# Patient Record
Sex: Male | Born: 1935 | Race: White | Hispanic: No | Marital: Married | Smoking: Former smoker
Health system: Southern US, Community
[De-identification: ages and names within clinical notes are randomized; demographics above are authoritative.]

## PROBLEM LIST (undated history)

## (undated) DIAGNOSIS — Z9889 Other specified postprocedural states: Secondary | ICD-10-CM

## (undated) DIAGNOSIS — I209 Angina pectoris, unspecified: Secondary | ICD-10-CM

## (undated) DIAGNOSIS — H409 Unspecified glaucoma: Secondary | ICD-10-CM

## (undated) DIAGNOSIS — N400 Enlarged prostate without lower urinary tract symptoms: Secondary | ICD-10-CM

## (undated) DIAGNOSIS — Z8781 Personal history of (healed) traumatic fracture: Secondary | ICD-10-CM

## (undated) DIAGNOSIS — M549 Dorsalgia, unspecified: Secondary | ICD-10-CM

## (undated) DIAGNOSIS — Z8619 Personal history of other infectious and parasitic diseases: Secondary | ICD-10-CM

## (undated) DIAGNOSIS — R410 Disorientation, unspecified: Secondary | ICD-10-CM

## (undated) DIAGNOSIS — I499 Cardiac arrhythmia, unspecified: Secondary | ICD-10-CM

## (undated) DIAGNOSIS — Z950 Presence of cardiac pacemaker: Secondary | ICD-10-CM

## (undated) DIAGNOSIS — Z860100 Personal history of colon polyps, unspecified: Secondary | ICD-10-CM

## (undated) DIAGNOSIS — J302 Other seasonal allergic rhinitis: Secondary | ICD-10-CM

## (undated) DIAGNOSIS — R0602 Shortness of breath: Secondary | ICD-10-CM

## (undated) DIAGNOSIS — E785 Hyperlipidemia, unspecified: Secondary | ICD-10-CM

## (undated) DIAGNOSIS — K5790 Diverticulosis of intestine, part unspecified, without perforation or abscess without bleeding: Secondary | ICD-10-CM

## (undated) DIAGNOSIS — G8929 Other chronic pain: Secondary | ICD-10-CM

## (undated) DIAGNOSIS — R351 Nocturia: Secondary | ICD-10-CM

## (undated) DIAGNOSIS — R112 Nausea with vomiting, unspecified: Secondary | ICD-10-CM

## (undated) DIAGNOSIS — I10 Essential (primary) hypertension: Secondary | ICD-10-CM

## (undated) DIAGNOSIS — R233 Spontaneous ecchymoses: Secondary | ICD-10-CM

## (undated) DIAGNOSIS — Z8719 Personal history of other diseases of the digestive system: Secondary | ICD-10-CM

## (undated) DIAGNOSIS — M1990 Unspecified osteoarthritis, unspecified site: Secondary | ICD-10-CM

## (undated) DIAGNOSIS — I509 Heart failure, unspecified: Secondary | ICD-10-CM

## (undated) DIAGNOSIS — M2550 Pain in unspecified joint: Secondary | ICD-10-CM

## (undated) HISTORY — PX: JOINT REPLACEMENT: SHX530

## (undated) HISTORY — DX: Hyperlipidemia, unspecified: E78.5

## (undated) HISTORY — PX: INSERT / REPLACE / REMOVE PACEMAKER: SUR710

## (undated) HISTORY — DX: Personal history of (healed) traumatic fracture: Z87.81

## (undated) HISTORY — PX: BACK SURGERY: SHX140

## (undated) HISTORY — DX: Unspecified osteoarthritis, unspecified site: M19.90

## (undated) HISTORY — PX: OTHER SURGICAL HISTORY: SHX169

## (undated) HISTORY — PX: ESOPHAGOGASTRODUODENOSCOPY: SHX1529

## (undated) HISTORY — PX: COLONOSCOPY: SHX174

---

## 1999-02-24 ENCOUNTER — Ambulatory Visit (HOSPITAL_COMMUNITY): Admission: RE | Admit: 1999-02-24 | Discharge: 1999-02-24 | Payer: Self-pay | Admitting: Gastroenterology

## 1999-06-15 ENCOUNTER — Ambulatory Visit (HOSPITAL_COMMUNITY): Admission: RE | Admit: 1999-06-15 | Discharge: 1999-06-15 | Payer: Self-pay | Admitting: *Deleted

## 1999-09-28 ENCOUNTER — Other Ambulatory Visit: Admission: RE | Admit: 1999-09-28 | Discharge: 1999-09-28 | Payer: Self-pay | Admitting: Otolaryngology

## 1999-10-26 ENCOUNTER — Encounter (INDEPENDENT_AMBULATORY_CARE_PROVIDER_SITE_OTHER): Payer: Self-pay | Admitting: Specialist

## 1999-10-26 ENCOUNTER — Other Ambulatory Visit: Admission: RE | Admit: 1999-10-26 | Discharge: 1999-10-26 | Payer: Self-pay | Admitting: Otolaryngology

## 2001-02-05 ENCOUNTER — Encounter: Admission: RE | Admit: 2001-02-05 | Discharge: 2001-02-05 | Payer: Self-pay | Admitting: Internal Medicine

## 2001-02-05 ENCOUNTER — Encounter: Payer: Self-pay | Admitting: Internal Medicine

## 2001-03-22 ENCOUNTER — Ambulatory Visit (HOSPITAL_BASED_OUTPATIENT_CLINIC_OR_DEPARTMENT_OTHER): Admission: RE | Admit: 2001-03-22 | Discharge: 2001-03-22 | Payer: Self-pay | Admitting: *Deleted

## 2001-06-05 ENCOUNTER — Encounter: Admission: RE | Admit: 2001-06-05 | Discharge: 2001-06-05 | Payer: Self-pay | Admitting: Internal Medicine

## 2001-06-05 ENCOUNTER — Encounter: Payer: Self-pay | Admitting: Internal Medicine

## 2001-08-20 ENCOUNTER — Ambulatory Visit (HOSPITAL_BASED_OUTPATIENT_CLINIC_OR_DEPARTMENT_OTHER): Admission: RE | Admit: 2001-08-20 | Discharge: 2001-08-20 | Payer: Self-pay | Admitting: Internal Medicine

## 2003-01-22 ENCOUNTER — Ambulatory Visit (HOSPITAL_COMMUNITY): Admission: RE | Admit: 2003-01-22 | Discharge: 2003-01-22 | Payer: Self-pay | Admitting: *Deleted

## 2003-02-10 ENCOUNTER — Ambulatory Visit (HOSPITAL_COMMUNITY): Admission: RE | Admit: 2003-02-10 | Discharge: 2003-02-11 | Payer: Self-pay | Admitting: Cardiology

## 2004-03-18 ENCOUNTER — Ambulatory Visit (HOSPITAL_COMMUNITY): Admission: RE | Admit: 2004-03-18 | Discharge: 2004-03-19 | Payer: Self-pay | Admitting: Orthopedic Surgery

## 2004-07-04 ENCOUNTER — Encounter: Admission: RE | Admit: 2004-07-04 | Discharge: 2004-07-04 | Payer: Self-pay | Admitting: Internal Medicine

## 2006-08-27 ENCOUNTER — Encounter: Admission: RE | Admit: 2006-08-27 | Discharge: 2006-08-27 | Payer: Self-pay | Admitting: Sports Medicine

## 2006-08-30 ENCOUNTER — Encounter: Admission: RE | Admit: 2006-08-30 | Discharge: 2006-08-30 | Payer: Self-pay | Admitting: Sports Medicine

## 2006-09-13 ENCOUNTER — Encounter: Admission: RE | Admit: 2006-09-13 | Discharge: 2006-09-13 | Payer: Self-pay | Admitting: Sports Medicine

## 2006-09-27 ENCOUNTER — Encounter: Admission: RE | Admit: 2006-09-27 | Discharge: 2006-09-27 | Payer: Self-pay | Admitting: Sports Medicine

## 2007-01-19 HISTORY — PX: TRANSTHORACIC ECHOCARDIOGRAM: SHX275

## 2007-01-31 ENCOUNTER — Encounter (INDEPENDENT_AMBULATORY_CARE_PROVIDER_SITE_OTHER): Payer: Self-pay | Admitting: Cardiology

## 2007-01-31 ENCOUNTER — Inpatient Hospital Stay (HOSPITAL_COMMUNITY): Admission: AD | Admit: 2007-01-31 | Discharge: 2007-02-01 | Payer: Self-pay | Admitting: Cardiology

## 2007-03-26 ENCOUNTER — Encounter: Admission: RE | Admit: 2007-03-26 | Discharge: 2007-03-26 | Payer: Self-pay | Admitting: Sports Medicine

## 2007-04-08 ENCOUNTER — Encounter: Admission: RE | Admit: 2007-04-08 | Discharge: 2007-04-08 | Payer: Self-pay | Admitting: Sports Medicine

## 2007-09-13 ENCOUNTER — Encounter: Admission: RE | Admit: 2007-09-13 | Discharge: 2007-09-13 | Payer: Self-pay | Admitting: Sports Medicine

## 2007-12-16 ENCOUNTER — Encounter: Admission: RE | Admit: 2007-12-16 | Discharge: 2007-12-16 | Payer: Self-pay | Admitting: Sports Medicine

## 2008-02-07 ENCOUNTER — Encounter: Admission: RE | Admit: 2008-02-07 | Discharge: 2008-02-07 | Payer: Self-pay | Admitting: Sports Medicine

## 2008-03-26 ENCOUNTER — Encounter: Admission: RE | Admit: 2008-03-26 | Discharge: 2008-03-26 | Payer: Self-pay | Admitting: Family Medicine

## 2008-09-01 ENCOUNTER — Encounter: Admission: RE | Admit: 2008-09-01 | Discharge: 2008-09-01 | Payer: Self-pay | Admitting: Sports Medicine

## 2009-01-05 ENCOUNTER — Ambulatory Visit: Payer: Self-pay | Admitting: Internal Medicine

## 2009-01-05 DIAGNOSIS — I10 Essential (primary) hypertension: Secondary | ICD-10-CM | POA: Insufficient documentation

## 2009-01-18 ENCOUNTER — Ambulatory Visit: Payer: Self-pay | Admitting: Internal Medicine

## 2009-01-18 LAB — CONVERTED CEMR LAB
Basophils Relative: 0.3 % (ref 0.0–3.0)
Calcium: 8.9 mg/dL (ref 8.4–10.5)
Creatinine, Ser: 1.8 mg/dL — ABNORMAL HIGH (ref 0.4–1.5)
Eosinophils Absolute: 0.3 10*3/uL (ref 0.0–0.7)
Eosinophils Relative: 3.5 % (ref 0.0–5.0)
GFR calc non Af Amer: 39.21 mL/min (ref >60)
Hemoglobin: 12.9 g/dL — ABNORMAL LOW (ref 13.0–17.0)
INR: 1.3 — ABNORMAL HIGH (ref 0.8–1.0)
Lymphocytes Relative: 23.3 % (ref 12.0–46.0)
MCHC: 34.4 g/dL (ref 30.0–36.0)
Monocytes Relative: 7.4 % (ref 3.0–12.0)
Neutro Abs: 4.9 10*3/uL (ref 1.4–7.7)
Neutrophils Relative %: 65.5 % (ref 43.0–77.0)
Prothrombin Time: 13.4 s — ABNORMAL HIGH (ref 9.1–11.7)
RBC: 3.69 M/uL — ABNORMAL LOW (ref 4.22–5.81)
Sodium: 132 meq/L — ABNORMAL LOW (ref 135–145)
WBC: 7.4 10*3/uL (ref 4.5–10.5)

## 2009-01-22 ENCOUNTER — Ambulatory Visit: Payer: Self-pay | Admitting: Internal Medicine

## 2009-01-22 ENCOUNTER — Inpatient Hospital Stay (HOSPITAL_COMMUNITY): Admission: AD | Admit: 2009-01-22 | Discharge: 2009-01-23 | Payer: Self-pay | Admitting: Internal Medicine

## 2009-01-23 ENCOUNTER — Encounter: Payer: Self-pay | Admitting: Internal Medicine

## 2009-01-26 ENCOUNTER — Encounter: Payer: Self-pay | Admitting: Internal Medicine

## 2009-02-04 ENCOUNTER — Encounter: Payer: Self-pay | Admitting: Internal Medicine

## 2009-02-04 ENCOUNTER — Ambulatory Visit: Payer: Self-pay

## 2009-02-05 LAB — CONVERTED CEMR LAB
Chloride: 90 meq/L — ABNORMAL LOW (ref 96–112)
Creatinine, Ser: 1.3 mg/dL (ref 0.4–1.5)
Sodium: 133 meq/L — ABNORMAL LOW (ref 135–145)

## 2009-02-16 ENCOUNTER — Telehealth: Payer: Self-pay | Admitting: Internal Medicine

## 2009-02-18 ENCOUNTER — Ambulatory Visit: Payer: Self-pay | Admitting: Internal Medicine

## 2009-03-15 ENCOUNTER — Encounter: Payer: Self-pay | Admitting: Internal Medicine

## 2009-03-16 ENCOUNTER — Encounter: Payer: Self-pay | Admitting: Internal Medicine

## 2009-03-19 ENCOUNTER — Encounter: Payer: Self-pay | Admitting: Internal Medicine

## 2009-03-22 ENCOUNTER — Telehealth: Payer: Self-pay | Admitting: Internal Medicine

## 2009-05-04 ENCOUNTER — Ambulatory Visit: Payer: Self-pay | Admitting: Internal Medicine

## 2009-05-04 DIAGNOSIS — Z950 Presence of cardiac pacemaker: Secondary | ICD-10-CM

## 2009-05-21 ENCOUNTER — Encounter: Admission: RE | Admit: 2009-05-21 | Discharge: 2009-05-21 | Payer: Self-pay | Admitting: Sports Medicine

## 2010-01-12 ENCOUNTER — Encounter: Admission: RE | Admit: 2010-01-12 | Discharge: 2010-01-12 | Payer: Self-pay | Admitting: Sports Medicine

## 2010-02-22 ENCOUNTER — Ambulatory Visit: Payer: Self-pay | Admitting: Internal Medicine

## 2010-03-09 ENCOUNTER — Encounter
Admission: RE | Admit: 2010-03-09 | Discharge: 2010-03-09 | Payer: Self-pay | Source: Home / Self Care | Attending: Sports Medicine | Admitting: Sports Medicine

## 2010-04-18 ENCOUNTER — Encounter: Payer: Self-pay | Admitting: Internal Medicine

## 2010-04-19 NOTE — Letter (Signed)
Summary: [facility]  [facility]   Imported By: Marylou Mccoy 04/08/2009 12:12:04  _____________________________________________________________________  External Attachment:    Type:   Image     Comment:   External Document

## 2010-04-19 NOTE — Letter (Signed)
Summary: [facility]  [facility]   Imported By: Marylou Mccoy 04/08/2009 13:16:15  _____________________________________________________________________  External Attachment:    Type:   Image     Comment:   External Document

## 2010-04-19 NOTE — Cardiovascular Report (Signed)
Summary: Status Report  Status Report   Imported By: Kassie Mends 03/24/2009 09:08:01  _____________________________________________________________________  External Attachment:    Type:   Image     Comment:   External Document

## 2010-04-19 NOTE — Progress Notes (Signed)
Summary: SOB  Phone Note Call from Patient Call back at [phone]   Caller: Spouse/Shirley Reason for Call: Talk to Nurse Summary of Call: SOB, having trouble walking, think pacer is not working, starting going on friday, wants to come in today Initial call taken by: Migdalia Dk,  March 22, 2009 11:13 AM  Follow-up for Phone Call        spoke with pt's wife his device was checked on 02/18/09 and was functioning normally.  They can come in tomorrow and see Dr Ladona Ridgel but might should start at his primary.  Sont think that disorientation has anything to do with his device. Dennis Bast, RN, BSN  March 22, 2009 1:10 PM  Additional Follow-up for Phone Call Additional follow up Details #1::        wife called back and they are going to go to primary today.  will call us back if need to see Dr Russ Halo, RN, BSN  March 22, 2009 1:10 PM

## 2010-04-19 NOTE — Cardiovascular Report (Signed)
Summary: Office Visit   Office Visit   Imported By: Roderic Ovens 05/11/2009 11:45:50  _____________________________________________________________________  External Attachment:    Type:   Image     Comment:   External Document

## 2010-04-19 NOTE — Assessment & Plan Note (Signed)
Summary: pc2/jml   Primary Provider:  Rayetta Pigg  CC:  dizzy spells -- break out.  History of Present Illness: Mr. Detter returns today for folllowup of his BiV upgrade.  The patient is a pleasant 77 yo man with class 3 CHF, and CHB who underwent device revision several months ago.  At that time he had an ICD lead with an elevated pacing threshold and an LV lead with the same problem.  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  He has felt much improved and his CHF is now class 2.  He has had no fever or chills. He initially had a stitch abscess over his right neck incision which has healed.  His main complaint today is regarding chronic dizziness which he has experienced over the past several months.  Since he has become more active with his new device, he is bothered by it more than when he was more sedentary.  Current Medications (verified): 1)  Losartan Potassium 25 Mg Tabs (Losartan Potassium) .... Once Daily 2)  Metoprolol Tartrate 50 Mg Tabs (Metoprolol Tartrate) .... 3 Tabs At Bedtime 3)  Isosorbide Mononitrate 20 Mg Tabs (Isosorbide Mononitrate) .... Once Daily 4)  Diclofenac Sodium 75 Mg Tbec (Diclofenac Sodium) .... Once Daily 5)  Furosemide 40 Mg Tabs (Furosemide) .... Take 1-2 Tablets By Mouth Daily 6)  Lipitor 20 Mg Tabs (Atorvastatin Calcium) .... Once Daily 7)  Nitrostat 0.4 Mg Subl (Nitroglycerin) .... As Needed 8)  Lantus 100 Unit/ml Soln (Insulin Glargine) .... As Directed 9)  Humulin N 100 Unit/ml Susp (Insulin Isophane Human) .... As Directed 10)  Potassium Gluconate 595 Mg Tabs (Potassium Gluconate) .... Once Daily 11)  Claritin .... Once Daily 12)  Lovaza Omega 3 .... Four Times A Day 13)  Saw Palmetto 450 Mg Caps (Saw Palmetto (Serenoa Repens)) .... Four Times A Day 14)  Cormbigan Eye  Drops 0.2 .... Two Drops  Two Times A Day 15)  Trevatan Z .... Once Nightly  Allergies (verified): No Known Drug Allergies  Past History:  Past Medical History: Last updated: 01/01/2009 Nose broken 3 times Hypertension treated Type II diabetes Cardiomyopathy Hyperlipidemia Arthritis  Review of Systems  The patient denies chest pain, syncope, dyspnea on exertion, and peripheral edema.    Vital Signs:  Patient profile:   77 year old male Height:      71 inches Weight:      228 pounds BMI:     31.91 Pulse rate:   77 / minute BP sitting:   132 / 81  (left arm) Cuff size:   regular  Vitals Entered By: Hardin Negus, RMA (May 04, 2009 3:20 PM)  Physical Exam  General:  Well developed, well nourished, in no acute distress. Head:  normocephalic and atraumatic Eyes:  PERRLA/EOM intact; conjunctiva and lids normal. Mouth:  Teeth, gums and palate normal. Oral mucosa normal. Neck:  well healed PM lead incision in the right neck area. Chest Wall:  Well healed PM incision. Lungs:  Clear bilaterally without basilar rales. No wheezes or rhonchi. Heart:  RRR.  PMI is enlarged and laterally displaced. Abdomen:  Obese, NT,ND. No organomegally. Msk:  Back normal, normal gait. Muscle strength and tone normal. Pulses:  pulses normal in all 4 extremities Extremities:  No clubbing or cyanosis.  Trace peripheral edema. Neurologic:  Alert and oriented x 3.   PPM Specifications Following MD:  Lewayne Bunting, MD     PPM Vendor:  St Jude     PPM Model Number:  [id number]     PPM Serial Number:  [id number] PPM DOI:  01/22/2009     PPM Implanting MD:  Lewayne Bunting, MD  Lead 1    Location: RA     DOI: 02/10/2003     Model #: 8295     Serial #: [id number]     Status: active Lead 2    Location: RV     DOI: 02/10/2003     Model #: 8469     Serial #: [id number]     Status: active Lead 3    Location: LV     DOI: 02/10/2003     Model #: 4193     Serial #: [id number]     Status: capped Lead 4     Location: LV     DOI: 01/22/2009     Model #: [id number]     Serial #: [id number]     Status: active  Magnet Response Rate:  BOL 100 ERI  85    PPM Follow Up Remote Check?  No Battery Voltage:  2.98 V     Battery Est. Longevity:  7.8 years     Pacer Dependent:  No       PPM Device Measurements Atrium  Amplitude: 2.6 mV, Impedance: 440 ohms, Threshold: 0.75 V at 0.4 msec Right Ventricle  Amplitude: 11.9 mV, Impedance: 440 ohms, Threshold: 0.875 V at 0.4 msec Left Ventricle  Impedance: 600 ohms, Threshold: 1.25 V at 0.8 msec Configuration: BIPOLAR  Episodes MS Episodes:  42     Percent Mode Switch:  <1%     Coumadin:  No Atrial Pacing:  12%     Ventricular Pacing:  100%  Parameters Mode:  DDD     Lower Rate Limit:  60     Upper Rate Limit:  120 Paced AV Delay:  160     Sensed AV Delay:  130 Next Cardiology Appt Due:  01/18/2010 Tech Comments:  RV autocapturen programmed on.  Device function normal.  ROV 11/11 Dr. Ladona Ridgel.l Altha Harm, LPN  May 04, 2009 3:40 PM  MD Comments:  His device is working normally.  ICD Specifications Following MD:  Lewayne Bunting, MD     Referring MD:  [facility] ICD Vendor:  Medtronic     ICD Model Number:  [phone]     ICD Serial Number:  [id number] ICD DOI:  01/31/2007     ICD Implanting MD:  EDMONDS  Lead 1:    Location: RA     DOI: 02/10/2003     Model #: 9518     Serial #: [id number]     Status: active Lead 2:    Location: RV     DOI: 01/31/2007     Model #: 1601     Serial #: [id number]     Status: active Lead 3:    Location: LV     DOI: 02/10/2003     Model #: 2202     Serial #: [id number]     Status: active  Indications::  CM, CHF   ICD Follow Up ICD Dependent:  No       ICD Device Measurements Configuration: LV TIP TO RV COIL  Brady Parameters Mode DDDR     Lower Rate Limit:  60  Upper Rate Limit 130 PAV 130     Sensed AV Delay:  160  Tachy Zones VF:  200     VT:  250 FVT VIA VF     VT1:  167     Impression &  Recommendations:  Problem # 1:  CARDIAC PACEMAKER IN SITU (ICD-V45.01) The patient's BiV PM is working normally.  Will recheck in several months.  Problem # 2:  CHRONIC SYSTOLIC HEART FAILURE (ICD-428.22) His symptoms are now class 2 after BiV lead revision.  A low sodium diet and continuation of his current meds is recommended. His updated medication list for this problem includes:    Losartan Potassium 25 Mg Tabs (Losartan potassium) ..... Once daily    Metoprolol Tartrate 50 Mg Tabs (Metoprolol tartrate) .Marland KitchenMarland KitchenMarland KitchenMarland Kitchen 3 tabs at bedtime    Isosorbide Mononitrate 20 Mg Tabs (Isosorbide mononitrate) ..... Once daily    Furosemide 40 Mg Tabs (Furosemide) .Marland Kitchen... Take 1-2 tablets by mouth daily    Nitrostat 0.4 Mg Subl (Nitroglycerin) .Marland Kitchen... As needed  Problem # 3:  ESSENTIAL HYPERTENSION, BENIGN (ICD-401.1) A low sodium diet is recommended and he will continue his current medical regimen. His updated medication list for this problem includes:    Losartan Potassium 25 Mg Tabs (Losartan potassium) ..... Once daily    Metoprolol Tartrate 50 Mg Tabs (Metoprolol tartrate) .Marland KitchenMarland KitchenMarland KitchenMarland Kitchen 3 tabs at bedtime    Furosemide 40 Mg Tabs (Furosemide) .Marland Kitchen... Take 1-2 tablets by mouth daily  Patient Instructions: 1)  Your physician recommends that you schedule a follow-up appointment in: Nov 2011with Dr Ladona Ridgel

## 2010-04-19 NOTE — Assessment & Plan Note (Signed)
Summary: device/saf   Visit Type:  Follow-up Primary Provider:  Rayetta Pigg   History of Present Illness: Mr. Haub returns today for folllowup of his BiV upgrade.  The patient is a pleasant 77 yo man with class 3 CHF, and CHB who underwent device revision several months ago.   He has felt much improved and his CHF is now class 2.   His main complaint today is related to his sore back.  When he is out walking, he notes that his lower back cramps up.  Since he has become more active with his new device, he is bothered by it more than when he was more sedentary.  Current Medications (verified): 1)  Losartan Potassium 25 Mg Tabs (Losartan Potassium) .... Once Daily 2)  Metoprolol Tartrate 50 Mg Tabs (Metoprolol Tartrate) .... 3 Tabs At Bedtime 3)  Diclofenac Sodium 75 Mg Tbec (Diclofenac Sodium) .... Once Daily 4)  Furosemide 40 Mg Tabs (Furosemide) .... Take 1-2 Tablets By Mouth Daily 5)  Simvastatin 40 Mg Tabs (Simvastatin) .... Take One Tablet By Mouth Daily At Bedtime 6)  Nitrostat 0.4 Mg Subl (Nitroglycerin) .... As Needed 7)  Lantus 100 Unit/ml Soln (Insulin Glargine) .... As Directed 8)  Humulin N 100 Unit/ml Susp (Insulin Isophane Human) .... As Directed 9)  Potassium Gluconate 595 Mg Tabs (Potassium Gluconate) .... Once Daily 10)  Claritin .... Once Daily 11)  Lovaza Omega 3 .... Four Times A Day 12)  Saw Palmetto 450 Mg Caps (Saw Palmetto (Serenoa Repens)) .... Four Times A Day 13)  Cormbigan Eye Drops 0.2 .... Two Drops  Two Times A Day 14)  Trevatan Z .... Once Nightly 15)  Multivitamins   Tabs (Multiple Vitamin) .... Once Daily 16)  Pepcid Ac 10 Mg Tabs (Famotidine) .... Once Daily  Allergies (verified): No Known Drug Allergies  Past History:  Past Medical History: Last updated: 01/01/2009 Nose broken 3 times Hypertension treated Type II diabetes Cardiomyopathy Hyperlipidemia Arthritis  Review of Systems  The patient denies chest pain, syncope, dyspnea on  exertion, and peripheral edema.    Vital Signs:  Patient profile:   77 year old male Height:      71 inches Weight:      220 pounds BMI:     30.79 Pulse rate:   62 / minute BP sitting:   112 / 72  (left arm)  Vitals Entered By: Laurance Flatten CMA (February 22, 2010 10:00 AM)  Physical Exam  General:  Well developed, well nourished, in no acute distress. Head:  normocephalic and atraumatic Eyes:  PERRLA/EOM intact; conjunctiva and lids normal. Mouth:  Teeth, gums and palate normal. Oral mucosa normal. Neck:  well healed PM lead incision in the right neck area. Chest Wall:  Well healed PM incision. Lungs:  Clear bilaterally without basilar rales. No wheezes or rhonchi. Heart:  RRR.  PMI is enlarged and laterally displaced. Abdomen:  Obese, NT,ND. No organomegally. Msk:  Back normal, normal gait. Muscle strength and tone normal. Pulses:  pulses normal in all 4 extremities Extremities:  No clubbing or cyanosis. Trace peripheral edema. Neurologic:  Alert and oriented x 3.   PPM Specifications Following MD:  Lewayne Bunting, MD     PPM Vendor:  St Jude     PPM Model Number:  [id number]     PPM Serial Number:  [id number] PPM DOI:  01/22/2009     PPM Implanting MD:  Lewayne Bunting, MD  Lead 1    Location: RA  DOI: 02/10/2003     Model #: 4132     Serial #: [id number]     Status: active Lead 2    Location: RV     DOI: 02/10/2003     Model #: 3664     Serial #: [id number]     Status: active Lead 3    Location: LV     DOI: 02/10/2003     Model #: 4193     Serial #: [id number]     Status: capped Lead 4    Location: LV     DOI: 01/22/2009     Model #: 3295     Serial #: [id number]     Status: active  Magnet Response Rate:  BOL 100 ERI  85    PPM Follow Up Battery Voltage:  2.96 V     Battery Est. Longevity:  6.4 yrs     Pacer Dependent:  No       PPM Device Measurements Atrium  Amplitude: 4.4 mV, Impedance: 440 ohms, Threshold: 1.25 V at 0.4 msec Right Ventricle  Amplitude: 12.0 mV,  Impedance: 400 ohms, Threshold: 1.125 V at 0.4 msec Left Ventricle  Impedance: 530 ohms, Threshold: 1.0 V at 0.8 msec Configuration: BIPOLAR  Episodes MS Episodes:  209     Percent Mode Switch:  <1%     Coumadin:  No Ventricular High Rate:  0     Atrial Pacing:  21&     Ventricular Pacing:  99%  Parameters Mode:  DDD     Lower Rate Limit:  60     Upper Rate Limit:  120 Paced AV Delay:  160     Sensed AV Delay:  130 Next Remote Date:  05/26/2010     Next Cardiology Appt Due:  02/20/2011 Tech Comments:  209 AMS EPISODES--LONGEST WAS 3 MIN 22 SECONDS.  NORMAL DEVICE FUNCTION.  CHANGED LV OUTPUT FROM 1.75 TO 2.0 AND TURNED ON RATE RESPONSE DURING MODE SWITCH.  PT WOULD LIKE TO BE ENROLLED IN [facility].  [facility]LIN 05-26-10 AND ROV IN 12 MTHS W/GT. Vella Kohler  February 22, 2010 10:19 AM MD Comments:  Agree with above.   ICD Specifications Following MD:  Lewayne Bunting, MD     Referring MD:  [facility] ICD Vendor:  Medtronic     ICD Model Number:  [phone]     ICD Serial Number:  [id number] ICD DOI:  01/31/2007     ICD Implanting MD:  EDMONDS  Lead 1:    Location: RA     DOI: 02/10/2003     Model #: 2025     Serial #: [id number]     Status: active Lead 2:    Location: RV     DOI: 01/31/2007     Model #: 2831     Serial #: [id number]     Status: active Lead 3:    Location: LV     DOI: 02/10/2003     Model #: 7106     Serial #: [id number]     Status: active  Indications::  CM, CHF   ICD Follow Up ICD Dependent:  No       ICD Device Measurements Configuration: LV TIP TO RV COIL  Brady Parameters Mode DDDR     Lower Rate Limit:  60     Upper Rate Limit 130 PAV 130     Sensed AV Delay:  160  Tachy Zones VF:  200     VT:  250  FVT VIA VF     VT1:  167     Impression & Recommendations:  Problem # 1:  CARDIAC PACEMAKER IN SITU (ICD-V45.01) His device is working normally.  will recheck in several months.  Problem # 2:  CHRONIC SYSTOLIC HEART FAILURE (ICD-428.22) His symptoms remain class 2.  He  will continue his meds as below. The following medications were removed from the medication list:    Isosorbide Mononitrate 20 Mg Tabs (Isosorbide mononitrate) ..... Once daily His updated medication list for this problem includes:    Losartan Potassium 25 Mg Tabs (Losartan potassium) ..... Once daily    Metoprolol Tartrate 50 Mg Tabs (Metoprolol tartrate) .Marland KitchenMarland KitchenMarland KitchenMarland Kitchen 3 tabs at bedtime    Furosemide 40 Mg Tabs (Furosemide) .Marland Kitchen... Take 1-2 tablets by mouth daily    Nitrostat 0.4 Mg Subl (Nitroglycerin) .Marland Kitchen... As needed  Problem # 3:  ESSENTIAL HYPERTENSION, BENIGN (ICD-401.1) His blood pressure remains well controlled. Will follow. His updated medication list for this problem includes:    Losartan Potassium 25 Mg Tabs (Losartan potassium) ..... Once daily    Metoprolol Tartrate 50 Mg Tabs (Metoprolol tartrate) .Marland KitchenMarland KitchenMarland KitchenMarland Kitchen 3 tabs at bedtime    Furosemide 40 Mg Tabs (Furosemide) .Marland Kitchen... Take 1-2 tablets by mouth daily  Patient Instructions: 1)  Your physician wants you to follow-up in: 12 months with Dr Court Joy will receive a reminder letter in the mail two months in advance. If you don't receive a letter, please call our office to schedule the follow-up appointment. Prescriptions: FUROSEMIDE 40 MG TABS (FUROSEMIDE) Take 1-2 tablets by mouth daily  #60 x 11   Entered by:   Laurance Flatten CMA   Authorized by:   Laren Boom, MD, [facility]   Signed by:   Laurance Flatten CMA on 02/22/2010   Method used:   Electronically to        [facility]* (retail)       [address] [place], [place]  [address]       Ph: [phone] or [phone]       Fax: [phone]   RxID:   [id number]

## 2010-04-21 NOTE — Cardiovascular Report (Signed)
Summary: Office Visit   Office Visit   Imported By: Roderic Ovens 03/03/2010 11:55:21  _____________________________________________________________________  External Attachment:    Type:   Image     Comment:   External Document

## 2010-04-27 NOTE — Miscellaneous (Signed)
Summary: corrected device information  Clinical Lists Changes  Observations: Added new observation of PPMLEADMOD4: [id number]  (04/18/2010 19:11)      PPM Specifications Following MD:  Lewayne Bunting, MD     PPM Vendor:  St Jude     PPM Model Number:  [phone]     PPM Serial Number:  [id number] PPM DOI:  01/22/2009     PPM Implanting MD:  Lewayne Bunting, MD  Lead 1    Location: RA     DOI: 02/10/2003     Model #: 1191     Serial #: [id number]     Status: active Lead 2    Location: RV     DOI: 02/10/2003     Model #: 3086     Serial #: [id number]     Status: active Lead 3    Location: LV     DOI: 02/10/2003     Model #: 4193     Serial #: [id number]     Status: capped Lead 4    Location: LV     DOI: 01/22/2009     Model #: [id number]     Serial #: [id number]     Status: active  Magnet Response Rate:  BOL 100 ERI  85    PPM Follow Up Pacer Dependent:  No     Configuration: BIPOLAR  Episodes Coumadin:  No  Parameters Mode:  DDD     Lower Rate Limit:  60     Upper Rate Limit:  120 Paced AV Delay:  160     Sensed AV Delay:  130  ICD Specifications Following MD:  Lewayne Bunting, MD     Referring MD:  EDMONDS ICD Vendor:  Medtronic     ICD Model Number:  [id number]     ICD Serial Number:  [id number] ICD DOI:  01/31/2007     ICD Implanting MD:  EDMONDS  Lead 1:    Location: RA     DOI: 02/10/2003     Model #: 5643     Serial #: [id number]     Status: active Lead 2:    Location: RV     DOI: 01/31/2007     Model #: 6606     Serial #: [id number]     Status: active Lead 3:    Location: LV     DOI: 02/10/2003     Model #: 2355     Serial #: [id number]     Status: active  Indications::  CM, CHF   ICD Follow Up ICD Dependent:  No       ICD Device Measurements Configuration: LV TIP TO RV COIL  Brady Parameters Mode DDDR     Lower Rate Limit:  60     Upper Rate Limit 130 PAV 130     Sensed AV Delay:  160  Tachy Zones VF:  200     VT:  250 FVT VIA VF     VT1:  167

## 2010-05-26 ENCOUNTER — Encounter: Payer: Self-pay | Admitting: Internal Medicine

## 2010-05-26 ENCOUNTER — Telehealth: Payer: Self-pay | Admitting: Internal Medicine

## 2010-05-26 ENCOUNTER — Encounter (INDEPENDENT_AMBULATORY_CARE_PROVIDER_SITE_OTHER): Payer: Medicare Other

## 2010-05-26 DIAGNOSIS — I495 Sick sinus syndrome: Secondary | ICD-10-CM

## 2010-06-02 ENCOUNTER — Encounter: Payer: Self-pay | Admitting: *Deleted

## 2010-06-07 NOTE — Letter (Signed)
Summary: Remote Device Check  [facility], Main Office  [address]   [place], [place] [address]   Phone: [phone]  Fax: [phone]     June 02, 2010 MRN: [id number]   Louis Acosta [address] [place], [place]  [address]   Dear Mr. [name],   Your remote transmission was recieved and reviewed by your physician.  All diagnostics were within normal limits for you.  _X____Your next transmission is scheduled for: 08/25/10.  Please transmit at any time this day.  If you have a wireless device your transmission will be sent automatically.  ______Your next office visit is scheduled for:                              . Please call our office to schedule an appointment.    Sincerely,  Altha Harm, LPN

## 2010-06-07 NOTE — Cardiovascular Report (Signed)
Summary: Office Visit   Office Visit   Imported By: Roderic Ovens 06/03/2010 14:54:55  _____________________________________________________________________  External Attachment:    Type:   Image     Comment:   External Document

## 2010-06-07 NOTE — Progress Notes (Signed)
Summary: question re device  Phone Note Call from Patient Call back at Home Phone [phone]   Caller: Patient Reason for Call: Talk to Nurse Summary of Call: pt has question re transmitting at home. Initial call taken by: Roe Coombs,  May 26, 2010 2:07 PM  Follow-up for Phone Call        no answer or answering machine  Additional Follow-up for Phone Call Additional follow up Details #1::        spoke w/pt---merlin transmission received. pt aware that he has wireless device and will automatically send as long as it is scheduled thru website. Vella Kohler  May 31, 2010 3:24 PM

## 2010-06-22 LAB — GLUCOSE, CAPILLARY
Glucose-Capillary: 125 mg/dL — ABNORMAL HIGH (ref 70–99)
Glucose-Capillary: 138 mg/dL — ABNORMAL HIGH (ref 70–99)
Glucose-Capillary: 144 mg/dL — ABNORMAL HIGH (ref 70–99)
Glucose-Capillary: 148 mg/dL — ABNORMAL HIGH (ref 70–99)
Glucose-Capillary: 188 mg/dL — ABNORMAL HIGH (ref 70–99)

## 2010-08-02 NOTE — Op Note (Signed)
NAMEALBA, KRIESEL              ACCOUNT NO.:  [id number]   MEDICAL RECORD NO.:  [id number]          PATIENT TYPE:  INP   LOCATION:  2807                         FACILITY:  [facility]   PHYSICIAN:  Francisca December, M.D.  DATE OF BIRTH:  01/03/1932   DATE OF PROCEDURE:  01/31/2007  DATE OF DISCHARGE:                               OPERATIVE REPORT   PROCEDURES PERFORMED:  1. Left subclavian venogram.  2. Upgrade biventricular pacemaker to IC/BiV.   INDICATIONS:  [name] is a 77 year old man who is now  approximately 2-1/2 years s/p implant of a biventricular pacemaker.  At  the time he declined an AICD.  His LVEF has continued to drop from 30%  now to 15%.  He has consented to an upgrade of the device to include  ventricular defibrillation capabilities.   DESCRIPTION OF PROCEDURE:  The patient was brought to the cardiac  catheterization laboratory in the fasting state.  The left prepectoral  region was prepped and draped in the usual sterile fashion.  Local  anesthesia was obtained with the infiltration of 1% lidocaine with  epinephrine throughout the left prepectoral region.  A 7-8 cm incision  was made over the previous incision site and this was carried down by  sharp dissection to the pacemaker capsule. The pacemaker capsule was  incised and the device delivered without difficulty.  The leads were  partially dissected using both blunt dissection and electrocautery.  A  left subclavian venogram was performed with a peripheral injection of 10  mL of Omnipaque.  A digital cine angiogram was obtained and road  mapped to guide future left subclavian puncture.  The subclavian  venogram did demonstrate the subclavian vein to be widely patent and  coursing in a normal fashion over the anterior surface of the first rib  and beneath the middle third of the clavicle.  There was no evidence for  persistence of the left superior vena cava.  The left subclavian vein  was then  punctured once using an 18-gauge thin-wall needle through which  was passed a 0.038-inch tight J guidewire.  Over this guidewire, a 9-  French tearaway sheath and dilator were advanced. The dilator and wire  were removed and the ventricular lead was advanced to the level of the  right atrium.  The sheath was torn away.  Using standard technique and  fluoroscopic landmarks, the pace shock lead was advanced into the right  ventricular apex.  This was an active fixation device and the screw was  advanced as appropriate.  It was tested for adequate pacing parameters  and these are reported below.  Is was also tested for diaphragmatic  pacing at 10 volts and none was found.  The lead was then sutured into  place using three separate #0 silk ligatures.  The pocket was then  enlarged both by blunt dissection and electrocautery.  It was copiously  irrigated with 1% kanamycin solution.  The leads were then attached to  the new pace shock generator carefully identifying each by its serial  number and placing each into the appropriate receptacle.  Each lead was  tightened into place and tested for security.  The previous RV lead was  capped and tied down with an #0 silk suture.  The leads were wound  beneath the pace shock generator and the generator was placed in the  pocket.  The pocket was then closed using 2-0 Vicryl in a running  fashion for the subcutaneous layer.  A second subcutaneous layer was  applied with 3-0 Vicryl.  The skin was approximated with 4-0 Vicryl in a  running subcuticular fashion.  Steri-Strips and a sterile dressing were  applied.  This patient was transported to the recovery area in stable  condition.   EQUIPMENT DATA:  The explanted device is a Medtronic model number [id number],  serial number [id number].  The implant lead is a Medtronic model number  [id number], serial number [id number]. The pace shock generator is a Medtronic  Concerto model [id number], serial number [id number].    PACING DATA:  The right ventricular lead detected a 12.5 mV R wave.  The  pacing threshold was 0.15 volts at 0.5 milliseconds pulse width.  The  impedance was 963 ohms.  The right atrial lead detected a 2.7 mV P-wave.  The pacing threshold was 0.7 volts at 0.5 milliseconds pulse width.  The  impedance was 567 ohms.  The left ventricular threshold using the RV  coil to the LV tip was 5 volts at 0.6 milliseconds pulse width.   The RV lead was remanipulated twice in an attempt to improve the RV LV  threshold.  It was confirmed prior to the procedure to be 1.5 volts.  We  using the old RV ring and the LV tip, the threshold was confirmed to be  1.5 volts; however, this could not be reproduced using the RV coil in  any of the positions I was able to place the device.  Further attempts  were therefore discontinued and the procedure was completed as described  above.      Francisca December, M.D.  Electronically Signed     JHE/MEDQ  D:  01/31/2007  T:  02/01/2007  Job:  [id number]   cc:   Corky Crafts, MD

## 2010-08-02 NOTE — Op Note (Signed)
NAMEDECLAN, Louis Acosta              ACCOUNT NO.:  [id number]   MEDICAL RECORD NO.:  [id number]          PATIENT TYPE:  INP   LOCATION:  2025                         FACILITY:  [facility]   PHYSICIAN:  Francisca December, M.D.  DATE OF BIRTH:  19-Dec-1931   DATE OF PROCEDURE:  02/01/2007  DATE OF DISCHARGE:                               OPERATIVE REPORT   PROCEDURE PERFORMED:  Defibrillation threshold testing.   INDICATIONS:  Status post implantable cardiovascular defibrillator  placement, January 31, 2007.  The patient has a severe nonischemic  cardiomyopathy, ICD placed for prophylactic purposes.   PROCEDURE NOTE:  While monitoring heart rate, blood pressure, O2  saturation and ECG and under the direct supervision of Dr. Sheldon Silvan  of the anesthesia department, the patient was administered a total dose  of 200 mg of Pentothal.   Attempted induction of ventricular fibrillation was unsuccessful despite  four attempts with shock on T, 8-drive pacing train.  Also attempted 50  Hz induction x2, again without successful induction of ventricular  fibrillation.  The patient reverted promptly to sinus rhythm with  ventricular pacing.   IMPRESSION:  Unable to induce ventricular fibrillation despite  aggressive attempts.   PLAN:  Will set the device to max output with usual VT zones from 170-  210 beats per minute.  Plan on discharging later today.      Francisca December, M.D.  Electronically Signed     JHE/MEDQ  D:  02/01/2007  T:  02/02/2007  Job:  [id number]

## 2010-08-05 NOTE — Op Note (Signed)
NAMEBASSEM, BERNASCONI              ACCOUNT NO.:  [id number]   MEDICAL RECORD NO.:  [id number]          PATIENT TYPE:  OIB   LOCATION:  2899                         FACILITY:  [facility]   PHYSICIAN:  Elana Alm. Thurston Hole, M.D. DATE OF BIRTH:  [birth year]   DATE OF PROCEDURE:  DATE OF DISCHARGE:                                 OPERATIVE REPORT   PREOPERATIVE DIAGNOSIS:  Right ankle synovitis.   POSTOPERATIVE DIAGNOSIS:  Right ankle synovitis.   PROCEDURE:  Right ankle examination under anesthesia, followed by  arthroscopic synovectomy.   ANESTHESIA:  Local and MAC.   OPERATIVE TIME:  30 minutes.   COMPLICATIONS:  None.   INDICATIONS FOR PROCEDURE:  Louis Acosta is a 77 year old gentleman who has  had significant right ankle pain for the past six to eight months,  increasing in nature, with exam and MRI documenting synovitis and mild  chondromalacia and DJD, who has failed conservative care and is now to  undergo arthroscopy.   DESCRIPTION OF PROCEDURE:  Louis Acosta was brought to the operating room on  03/18/04, placed on the operating table in the supine position.  His  anterior-lateral and anterior-medial portal sites and the joint were  sterilely injected with 0.25% Marcaine with epinephrine.  The right ankle  was examined.  Range of motion: Dorsiflexion of 5 degrees, plantar flexion  of 20 degrees.  The ankle was stable to ligamentous exam.  Right foot and  leg were prepped with sterile DuraPrep and draped using sterile technique.  Originating through an anterior-medial portal, incision was made, incising  only the skin and carefully dissecting down to the joint, preventing injury  to the dorsal cutaneous nerves.  Through an anterior-lateral portal, using  the same careful technique, an arthroscopic debrider was placed.  On initial  inspection of the articular cartilage on the table __________ was found to  be intact.  There was significant synovitis in the anterior and  anterolateral gutter, and this was thoroughly debrided.  The lateral  malleolus articular cartilage was intact.  The lateral gutter, other than  synovitis, which was debrided, had no other pathology.  After this was done,  the anterior-medial region of the ankle and the anterior-medial gutter was  thoroughly inspected and significant synovitis was noted there as well, and  this was thoroughly debrided.  Medial malleolus articular cartilage was  intact.  After this was done, there was no further pathology noted, and a  thorough synovectomy had been carried out.  Small synovial bleeders were  cauterized.  At this point, it was felt that all pathology had been  satisfactorily addressed.  The instruments were removed.  Portals were  closed with 3-0 nylon suture and injected with 0.25% Marcaine with  epinephrine.  Sterile dressings were applied.  The patient was awakened and  taken to recovery room in stable condition.  Needle and sponge counts were  correct x2 at the end of the case.   FOLLOW-UP CARE:  Louis Acosta will be followed overnight for observation on  the cardiac monitoring floor.  He will be discharged tomorrow, if stable,  with early physical  therapy.  He will be seen, in my office, in a week for  sutures out and followup.       RAW/MEDQ  D:  03/18/2004  T:  03/18/2004  Job:  [id number]

## 2010-08-05 NOTE — Op Note (Signed)
NAME:  Louis Acosta, Louis Acosta                        ACCOUNT NO.:  [id number]   MEDICAL RECORD NO.:  [id number]                   PATIENT TYPE:  OIB   LOCATION:  4710                                 FACILITY:  [facility]   PHYSICIAN:  Francisca December, M.D.               DATE OF BIRTH:  Feb 27, 1932   DATE OF PROCEDURE:  02/10/2003  DATE OF DISCHARGE:  02/11/2003                                 OPERATIVE REPORT   PROCEDURE PERFORMED:  1. Coronary angiography.  2. Coronary sinus angiography.  3. Insertion of three-lead biventricular cardiac resynchronization pacing     system.   INDICATIONS FOR PROCEDURE:  Mr. Anirudh Baiz is a 77 year old man with  known congestive heart failure and severe left ventricular dysfunction,  ejection fraction 15 to 20%.  He has NYHA class 3 exertional dyspnea from  CHF and a wide complex (LBBB) on electrocardiogram.  He is therefore a  candidate for insertion of biventricular pacemaking system.   DESCRIPTION OF PROCEDURE:  The patient was brought to the cardiac  catheterization laboratory in a fasting state.  The right groin and the left  prepectoral region were prepped and draped in the usual sterile fashion.  Local anesthesia was obtained with infiltration of 1% lidocaine over the  right femoral arterial pulse.  A 6 French catheter sheath was inserted  percutaneously into the right femoral artery utilizing an anterior posterior  guiding J-wire.  A 6 French #4 left Judkins catheter was advanced to the  ascending aorta where the left coronary os was engaged.  Cine angiography  with prolonged cine run was obtained in the AP, LAO and RAO projections for  localization and verification of patency of the coronary sinus.  The #5 left  Judkins catheter was then removed over a long guiding J-wire.  The catheter  sheath was sutured into place and connected to a transducer to monitor  arterial pressure.  After changing gown and glove, the attention was  directed to the left  prepectoral region.  Local anesthesia was attained with  the infiltration of 1% lidocaine throughout this region.  A 6 to 7 cm  incision was then made in the deltopectoral groove and this was carried down  by sharp dissection and electrocautery to the prepectoral fascia.  There a  plane was lifted and a pocket formed inferiorly and medially. The pocket was  then packed with a 1% Kanamycin soaked gauze.  Two separate left subclavian  punctures were then performed using an 18 gauge thin-walled needle through  which was passed a 0.038 inch tight J guidewire.  Over one of the  guidewires, a 5 French catheter sheath and dilator were advanced.  The  dilator was removed, the wire was allowed to remain in place.  A second  0.038 inch tight J guidewire was placed and the sheath was removed.  A 7  French tear-away sheath and dilator were then advanced over one  of the  wires.  The wire and dilator were removed.  The right ventricular lead was  advanced to the level of the right atrium.  Using standard technique and  fluoroscopic landmarks, the lead was manipulated into the right ventricular  apex.  There, excellent pacing parameters were obtained as will be noted  below.  This was and active fixation lead and the screw was advanced as  appropriate.  The tear-away sheath was removed and the lead was sutured in  place using three separate 0 silk ligatures.  Over a 5 [place] dilator, the  short 0.038 inch tight J guidewire was removed and a long 0.038 inch tight J  guidewire advanced to the level of the right atrium. A 9 French tear-away  sheath and dilator were advanced to the level of the right atrium. The  dilator was removed.  The wire was allowed to remain in place.  Using  fluoroscopic and angiographic landmarks, the sheath was manipulated into the  coronary sinus.  The wire was removed.  Cine angiography of the coronary  sinus was performed in RAO and AP projections with hand injection of   contrast.  A 0.014 inch guidewire was then advanced through the sheath and  into a left lateral major coronary vein.  The Medtronic model [id number] left  ventricular lead was advanced over this guidewire into the left lateral  vein.  Excellent pacing thresholds were obtained.  However, there was  diaphragmatic and chest wall pacing even at 3 to 5 V.  The lead was  manipulated into various positions and the chest wall pacing was not  improved.  Therefore, the lead was withdrawn to within the sheath and the  guidewire advanced out into the more distal anterolateral vein.  The lead  was manipulated into this venous structure.  There was no chest wall pacing  even at 10 V; however, the threshold was 3.5 V to capture.  The lead was  manipulated into several different positions without much improvement in the  capture threshold.  The decision was then made to allow the lead to remain  in this position.  A support Stylet was advanced into the lead and the guide  sheath removed using the slit device.  The Stylet was then removed and  fortunately, the left ventricular lead remained in position.  It was again  tested for adequate pacing and this was confirmed.  It was sutured into  place using three separate 0 silk ligatures.  Over the remaining guidewire  another 7 French tear-away sheath and dilator were advanced.  The dilator  and wire were removed.  The right atrial lead was manipulated into the right  atrium. The sheath was torn away.  Using standard technique and fluoroscopic  landmarks, the lead was manipulated into the right atrial appendage.  There  excellent pacing parameters were obtained as will be noted below.  This was  also an active fixation lead and the screw was advanced as was appropriate.  The lead was tested for diaphragmatic pacing and none was found.  The lead  was then sutured in place using three separate 0 silk ligatures.  The Kanamycin soaked gauze was removed from the pocket  and the pocket was  copiously irrigated using 1% Kanamycin solution.  The pocket was inspected  for bleeding, none was found.  Avitene was still placed within the pocket,  however, for hemostasis.  The leads were wound beneath the pacing generator  and the generator was placed  in the pocket.  This was after each lead was  carefully attached to the pacing generator in the appropriate receptacle  under the supervision of a Medtronic representative.  Each lead was  tightened into place as appropriate and tested for security.  Finally, the  wound was closed using 2-0 Dexon in a running fashion with a subcutaneous  layer.  The skin was approximated using 5-0 Dexon in a running subcuticular  fashion.  Steri-Strips and a sterile dressing were applied and the patient  was transported to the recovery area in an A-sensed biventricular paced  mode.   EQUIPMENT DATA:  The pacing generator is a Medronic InSync III, model number  [id number], serial number [id number].  The atrial lead is a Medtronic model number  [id number], serial number [id number].  The right ventricular lead is a Medtronic  model number [id number], serial number [id number].  The left ventricular lead is a  Medtronic model number [id number], serial number [id number].   PACING THRESHOLDS:  The right ventricular lead detected a 5.5 mV R wave.  The pacing threshold was 0.6 V at 0.5 msec pulse width. The impedance was  1064 ohms.  This resulted in a current capture threshold of 0.6 mA.  The  left ventricular lead detected a 20 mV R wave.  The pacing threshold was 3.7  V at 0.5 msec pulse width.  The impedance was 1019 ohms resulting in a  current capture threshold of 4.8 mA.  The right atrial lead detected a 5.1  mV P wave.  The pacing threshold was 1.4 V at 0.5 msec pulse width.  The  impedance was 560 ohms resulting in a current capture threshold of 3.0 mA.                                               Francisca December, M.D.    JHE/MEDQ  D:  02/10/2003   T:  02/11/2003  Job:  [id number]   cc:   Meade Maw, M.D.  [address]  [place]  [place] [address]  Fax: [phone]

## 2010-08-05 NOTE — Cardiovascular Report (Signed)
NAME:  Louis Acosta, Louis Acosta                        ACCOUNT NO.:  [id number]   MEDICAL RECORD NO.:  [id number]                   PATIENT TYPE:  OIB   LOCATION:  2861                                 FACILITY:  [facility]   PHYSICIAN:  Meade Maw, M.D.                 DATE OF BIRTH:  03/10/1932   DATE OF PROCEDURE:  01/22/2003  DATE OF DISCHARGE:                              CARDIAC CATHETERIZATION   INDICATION FOR PROCEDURE:  Increasing fatigue, worsening EF.  Cardiolite  study on October 20 revealing inferior apical as well anterior apical  defect.  There was minimal reversal noted.  His ejection fraction had  decreased from 30% to 15%.   PROCEDURE:  After obtaining written informed consent, the patient was  brought to the outpatient cardiac catheterization lab in a postabsorptive  state.  Preop sedation was achieved using Versed 1 mg, Benadryl 25 mg p.o.  The right groin was prepped and draped in the usual sterile fashion.  Local  anesthesia was achieved using 1% Xylocaine.  A 6 French hemostasis sheath  was placed into the right femoral artery using the modified Seldinger  technique.  Selective coronary angiography was performing using a JL-5, JR-4  Judkins catheter and multiple views were obtained.  All catheter exchanges  were made over guide wire.  It was elected not to proceed with the  ventriculogram as the patient's creatinine was noted to be 1.6.  Total  contrast used was 65 mL.  Total fluoro time 2.3 minutes.  The patient was  then transferred to the holding area.  The hemostasis sheath was removed and  hemostasis was achieved using digital pressure.   FINDINGS:  1. Aortic pressure was 120/73.  2. LV pressure 120/17.  3. His EDP was elevated at 36.   CORONARY ANGIOGRAPHY:  1. The left main coronary artery bifurcates into the left anterior     descending and circumflex vessel.  There is no disease noted in the left     main coronary artery.  2. LAD:  Left anterior  descending is a large artery giving rise to three     diagonal and ending as an apical recurrent branch.  There is luminal     irregularities of up to 20-30% in the left anterior descending.  3. Circumflex vessel:  The circumflex gives rise to an early OM-1, large OM-     2 and goes on in as a lateral branch.  There is luminal irregularities in     the circumflex and its branches only.  4. Right coronary artery:  The right coronary artery is a large dominant     artery.  It gives rise to three RV marginals and a PDA.  There is luminal     irregularities in the right coronary arteries only.   FINAL IMPRESSION:  1. Luminal irregularities.  2. Nonischemic cardiomyopathy.  3. Elevated EDP at 36.  4. The  patient will refer to EPS for consideration of biventricular pacing.                                               Meade Maw, M.D.    HP/MEDQ  D:  01/22/2003  T:  01/22/2003  Job:  [id number]

## 2010-08-05 NOTE — Op Note (Signed)
. [facility]  Patient:    Louis Acosta, Louis Acosta                     MRN: [id number] Proc. Date: 06/15/99 Adm. Date:  [id number] Attending:  Meade Maw A CC:         Darius Bump, M.D.                           Operative Report  PROCEDURE:  Cardiac catheterization.  INDICATION FOR PROCEDURE: Dilated cardiomyopathy associated with shortness of breath.  REFERRING PHYSICIAN:  Darius Bump, M.D.  DESCRIPTION OF PROCEDURE:  After obtaining written informed consent, the patient was brought to the cardiac catheterization lab in postabsorptive state.  Preop sedation was achieved using IV Versed.  The right groin was prepped and draped n the usual sterile fashion.  Local anesthesia was achieved using 1% Xylocaine.  6-French hemostasis sheath was placed into the right femoral artery using modified Seldinger technique.  Selective coronary angiography was performed using the JL-4, JR-4 Judkins catheter.  Nonionic contrast was used and was hand injected for the coronaries.  Single plane ventriculogram was performed in the RAO position using a 6-French pigtail curved catheter.  Contrast was used and was power injected for a total of 30 cc.  Of note, the patients creatinine was noted to be 1.6 on the day prior to catheterization.  Hydration was achieved, and repeat creatinine was 1.0. Prominent procedure events were reviewed with Dr. Amil Amen.  It was felt that intervention  should not be performed at this time and patient should be attempted on medical  management and having an ischemia study prior to intervention on the identified  lesions.  FINDINGS:  The aortic pressure was 121/64, LV pressure 120/25.  There was no gradient noted on pull back.  Single plane ventriculogram revealed diffuse hypokinesis with an ejection fraction of 30%.  There was no mitral regurgitation noted.  Coronary angiography:  Left main coronary artery bifurcated  into the left anterior descending and circumflex vessel.  There was no significant disease in the left  main coronary artery.  Left anterior descending:  The left anterior descending gave rise to a moderate  size D1 and small D2 and a small D3, and D4 was moderate in size.  The LAD went  onto and is an apical recurrent branch.   There was a 30 to 40% proximal lesion in the LAD, a 70% lesion in the second diagonal, and a distal 80% lesion noted.  Circumflex vessel:  The circumflex vessel gave rise to a large obtuse marginal-1, moderate size OM-2, small OM-3 and a moderate OM-4.  There was a 40% proximal long lesion in the first obtuse marginal and 30% lesion in the second obtuse marginal, and OM-3 and OM-4 were free of disease.  Right coronary artery:  The right coronary artery is dominant.  It gave rise to a small RV marginal-1, small RV marginal-2, and moderate size RV marginal-3. Right coronary artery was dominant for the posterior circulation and gave rise to a ______ .  There was a 70% lesion noted in the PDA  IMPRESSION:  Dilated cardiomyopathy.  There is critical disease involving a small second diagonal, distal left anterior descending artery and posterior descending artery.  However, the cardiomyopathy is out of proportion to the medical management.  Dr. Amil Amen was consulted for possible intervention and felt that he patient should be first started  on medical therapy.  Intervention can be considered should the patient continue to have significant chest pain.  Hypertension may be the etiology of his cardiomyopathy. DD:  06/15/99 TD:  06/15/99 Job: [id number]

## 2010-08-25 ENCOUNTER — Ambulatory Visit (INDEPENDENT_AMBULATORY_CARE_PROVIDER_SITE_OTHER): Payer: Medicare Other | Admitting: *Deleted

## 2010-08-25 DIAGNOSIS — I442 Atrioventricular block, complete: Secondary | ICD-10-CM

## 2010-08-25 DIAGNOSIS — I5022 Chronic systolic (congestive) heart failure: Secondary | ICD-10-CM

## 2010-08-28 ENCOUNTER — Other Ambulatory Visit: Payer: Self-pay | Admitting: Internal Medicine

## 2010-09-01 NOTE — Progress Notes (Signed)
Pacer remote check  

## 2010-09-09 ENCOUNTER — Encounter: Payer: Self-pay | Admitting: *Deleted

## 2010-10-11 ENCOUNTER — Other Ambulatory Visit: Payer: Self-pay | Admitting: Sports Medicine

## 2010-10-11 DIAGNOSIS — M545 Low back pain: Secondary | ICD-10-CM

## 2010-10-13 ENCOUNTER — Ambulatory Visit
Admission: RE | Admit: 2010-10-13 | Discharge: 2010-10-13 | Disposition: A | Discharge status: Home / Self Care | Payer: Medicare Other | Source: Ambulatory Visit | Attending: Sports Medicine | Admitting: Sports Medicine

## 2010-10-13 DIAGNOSIS — M545 Low back pain: Secondary | ICD-10-CM

## 2010-10-13 MED ORDER — IOHEXOL 180 MG/ML  SOLN
1.0000 mL | Freq: Once | INTRAMUSCULAR | Status: AC | PRN
  Administered 2010-10-13: 1 mL via EPIDURAL

## 2010-10-13 MED ORDER — METHYLPREDNISOLONE ACETATE 40 MG/ML INJ SUSP (RADIOLOG
120.0000 mg | Freq: Once | INTRAMUSCULAR | Status: AC
  Administered 2010-10-13: 120 mg via EPIDURAL

## 2010-11-24 ENCOUNTER — Other Ambulatory Visit: Payer: Self-pay | Admitting: Internal Medicine

## 2010-11-24 ENCOUNTER — Encounter: Payer: Self-pay | Admitting: Internal Medicine

## 2010-11-24 ENCOUNTER — Ambulatory Visit (INDEPENDENT_AMBULATORY_CARE_PROVIDER_SITE_OTHER): Payer: Medicare Other | Admitting: *Deleted

## 2010-11-24 DIAGNOSIS — Z950 Presence of cardiac pacemaker: Secondary | ICD-10-CM

## 2010-11-24 DIAGNOSIS — I442 Atrioventricular block, complete: Secondary | ICD-10-CM

## 2010-11-24 LAB — REMOTE PACEMAKER DEVICE
AL AMPLITUDE: 3 mv
AL IMPEDENCE PM: 440 Ohm
DEVICE MODEL PM: 2376403
LV LEAD IMPEDENCE PM: 550 Ohm
RV LEAD AMPLITUDE: 12 mv
RV LEAD IMPEDENCE PM: 410 Ohm

## 2010-11-30 ENCOUNTER — Encounter: Payer: Self-pay | Admitting: *Deleted

## 2010-12-05 NOTE — Progress Notes (Signed)
Pacer remote check  

## 2011-02-20 ENCOUNTER — Other Ambulatory Visit: Payer: Self-pay | Admitting: *Deleted

## 2011-02-20 MED ORDER — FUROSEMIDE 40 MG PO TABS: 40.0000 mg | ORAL_TABLET | Freq: Every day | ORAL | Status: DC

## 2011-03-24 ENCOUNTER — Encounter: Payer: Self-pay | Admitting: Internal Medicine

## 2011-03-28 ENCOUNTER — Encounter: Payer: Self-pay | Admitting: Internal Medicine

## 2011-03-28 ENCOUNTER — Ambulatory Visit (INDEPENDENT_AMBULATORY_CARE_PROVIDER_SITE_OTHER): Payer: Medicare Other | Admitting: Internal Medicine

## 2011-03-28 DIAGNOSIS — I208 Other forms of angina pectoris: Secondary | ICD-10-CM

## 2011-03-28 DIAGNOSIS — Z950 Presence of cardiac pacemaker: Secondary | ICD-10-CM

## 2011-03-28 DIAGNOSIS — I5022 Chronic systolic (congestive) heart failure: Secondary | ICD-10-CM

## 2011-03-28 DIAGNOSIS — I209 Angina pectoris, unspecified: Secondary | ICD-10-CM

## 2011-03-28 DIAGNOSIS — I447 Left bundle-branch block, unspecified: Secondary | ICD-10-CM

## 2011-03-28 LAB — PACEMAKER DEVICE OBSERVATION
AL IMPEDENCE PM: 437.5 Ohm
AL THRESHOLD: 0.75 V
BAMS-0001: 150 {beats}/min
DEVICE MODEL PM: 2376403
LV LEAD THRESHOLD: 1 V
RV LEAD AMPLITUDE: 8.4 mv

## 2011-03-28 MED ORDER — FUROSEMIDE 40 MG PO TABS: 40.0000 mg | ORAL_TABLET | Freq: Two times a day (BID) | ORAL | Status: DC

## 2011-03-28 NOTE — Progress Notes (Signed)
HPI Louis Acosta returns today for followup. He is a very pleasant 78 year old man with a long-standing dilated cardiomyopathy, complete heart block, chronic systolic heart failure, status post bi-ventricular pacemaker insertion. His heart failure symptoms remain class II. He has noted chest pain with exertion. He takes nitroglycerin as needed for control. He denies peripheral edema, syncope, fevers, chills, abdominal pain, or change in bowel or bladder habit. No Known Allergies   Current Outpatient Prescriptions  Medication Sig Dispense Refill  . aspirin 81 MG tablet Take 160 mg by mouth daily.        . B Complex Vitamins (VITAMIN-B COMPLEX PO) Take 1 tablet by mouth daily.        Marland Kitchen [employer] M[employer] UNILET LANCETS 30G MISC by Does not apply route.        . fish oil-omega-3 fatty acids 1000 MG capsule Take 2 g by mouth daily.        . furosemide (LASIX) 40 MG tablet Take 1 tablet (40 mg total) by mouth 2 (two) times daily.  60 tablet  11  . Glucose Blood (BAYER BREEZE 2 TEST VI) by In Vitro route 4 (four) times daily.        . insulin lispro (HUMALOG) 100 UNIT/ML injection Inject 10-20 Units into the skin every evening.        . loratadine (CLARITIN) 10 MG tablet Take 10 mg by mouth daily as needed.        Marland Kitchen losartan (COZAAR) 25 MG tablet Take 25 mg by mouth daily.        . metoprolol (LOPRESSOR) 50 MG tablet Take 50 mg by mouth 2 (two) times daily. Takes 2 tablets in the morning and 1 tablet in the evening.       . Misc Natural Products (OSTEO BI-FLEX TRIPLE STRENGTH PO) Take by mouth as directed.        . Multiple Vitamins-Minerals (MULTIVITAMIN WITH MINERALS) tablet Take 1 tablet by mouth daily.        . nitroGLYCERIN (NITROSTAT) 0.4 MG SL tablet Place 0.4 mg under the tongue every 5 (five) minutes as needed.        Marland Kitchen POTASSIUM CHLORIDE PO Take 595 mg by mouth daily.        . saw palmetto 500 MG capsule Take 450 mg by mouth daily.        . simvastatin (ZOCOR) 40 MG tablet Take 40 mg by mouth every  evening.        . travoprost, benzalkonium, (TRAVATAN) 0.004 % ophthalmic solution Place 1 drop into both eyes at bedtime.        Marland Kitchen DISCONTD: furosemide (LASIX) 40 MG tablet Take 1 tablet (40 mg total) by mouth daily.  60 tablet  3     Past Medical History  Diagnosis Date  . Cardiomyopathy     broken 3x's  . History of fracture of nose   . Diabetes mellitus     type 2  . Hyperlipidemia   . Arthritis     ROS:   All systems reviewed and negative except as noted in the HPI.   Past Surgical History  Procedure Date  . Transthoracic echocardiogram 01/2007     No family history on file.   History   Social History  . Marital Status: Married    Spouse Name: N/A    Number of Children: N/A  . Years of Education: N/A   Occupational History  . retired    Social History Main Topics  .  Smoking status: Never Smoker   . Smokeless tobacco: Not on file  . Alcohol Use: No  . Drug Use: Not on file  . Sexually Active: Not on file   Other Topics Concern  . Not on file   Social History Narrative  . No narrative on file     BP 108/58  Pulse 60  Ht 5\' 11"  (1.803 m)  Wt 97.07 kg (214 lb)  BMI 29.85 kg/m2  Physical Exam:  Well appearing elderly man, NAD HEENT: Unremarkable Neck:  6 cm JVD, no thyromegally Lungs:  Clear with no wheezes, rales, or rhonchi. Well-healed pacemaker incision. HEART:  Regular rate rhythm, no murmurs, no rubs, no clicks Abd:  soft, positive bowel sounds, no organomegally, no rebound, no guarding Ext:  2 plus pulses, no edema, no cyanosis, no clubbing Skin:  No rashes no nodules Neuro:  CN II through XII intact, motor grossly intact  DEVICE  Normal device function.  See PaceArt for details.   Assess/Plan:

## 2011-03-28 NOTE — Patient Instructions (Signed)
Your physician wants you to follow-up in: 12 months with Dr Taylor You will receive a reminder letter in the mail two months in advance. If you don't receive a letter, please call our office to schedule the follow-up appointment.   Remote monitoring is used to monitor your Pacemaker of ICD from home. This monitoring reduces the number of office visits required to check your device to one time per year. It allows us to keep an eye on the functioning of your device to ensure it is working properly. You are scheduled for a device check from home on 06/29/2011. You may send your transmission at any time that day. If you have a wireless device, the transmission will be sent automatically. After your physician reviews your transmission, you will receive a postcard with your next transmission date.   

## 2011-03-28 NOTE — Assessment & Plan Note (Signed)
HIs device is working normally. Will recheck in several months. 

## 2011-03-28 NOTE — Assessment & Plan Note (Signed)
I have encouraged him to continue his regular daily walking. He is instructed to keep sublingual nitroglycerin with him at all times. If his symptoms increase in frequency and severity, he is instructed to let us know and he would have to undergo catheterization

## 2011-03-28 NOTE — Assessment & Plan Note (Signed)
His symptoms remain class II. He will continue his current medical therapy and maintain a low sodium diet. 

## 2011-06-02 ENCOUNTER — Ambulatory Visit
Admission: RE | Admit: 2011-06-02 | Discharge: 2011-06-02 | Disposition: A | Discharge status: Home / Self Care | Payer: Medicare Other | Source: Ambulatory Visit | Attending: Sports Medicine | Admitting: Sports Medicine

## 2011-06-02 ENCOUNTER — Other Ambulatory Visit: Payer: Self-pay | Admitting: Sports Medicine

## 2011-06-02 DIAGNOSIS — M549 Dorsalgia, unspecified: Secondary | ICD-10-CM

## 2011-06-07 ENCOUNTER — Other Ambulatory Visit: Payer: Self-pay | Admitting: Sports Medicine

## 2011-06-07 DIAGNOSIS — M79606 Pain in leg, unspecified: Secondary | ICD-10-CM

## 2011-06-09 ENCOUNTER — Ambulatory Visit
Admission: RE | Admit: 2011-06-09 | Discharge: 2011-06-09 | Disposition: A | Discharge status: Home / Self Care | Payer: Medicare Other | Source: Ambulatory Visit | Attending: Sports Medicine | Admitting: Sports Medicine

## 2011-06-09 DIAGNOSIS — M79606 Pain in leg, unspecified: Secondary | ICD-10-CM

## 2011-06-09 MED ORDER — IOHEXOL 180 MG/ML  SOLN
1.0000 mL | Freq: Once | INTRAMUSCULAR | Status: AC | PRN
  Administered 2011-06-09: 1 mL via EPIDURAL

## 2011-06-09 MED ORDER — METHYLPREDNISOLONE ACETATE 40 MG/ML INJ SUSP (RADIOLOG
120.0000 mg | Freq: Once | INTRAMUSCULAR | Status: AC
  Administered 2011-06-09: 120 mg via EPIDURAL

## 2011-06-29 ENCOUNTER — Encounter: Payer: Self-pay | Admitting: Internal Medicine

## 2011-06-29 ENCOUNTER — Ambulatory Visit (INDEPENDENT_AMBULATORY_CARE_PROVIDER_SITE_OTHER): Payer: Medicare Other | Admitting: *Deleted

## 2011-06-29 DIAGNOSIS — I442 Atrioventricular block, complete: Secondary | ICD-10-CM

## 2011-06-30 LAB — REMOTE PACEMAKER DEVICE
ATRIAL PACING PM: 30
BAMS-0001: 150 {beats}/min
BAMS-0003: 80 {beats}/min
VENTRICULAR PACING PM: 96

## 2011-07-03 ENCOUNTER — Ambulatory Visit (INDEPENDENT_AMBULATORY_CARE_PROVIDER_SITE_OTHER): Payer: Medicare Other | Admitting: *Deleted

## 2011-07-03 ENCOUNTER — Encounter: Payer: Self-pay | Admitting: Internal Medicine

## 2011-07-03 DIAGNOSIS — I428 Other cardiomyopathies: Secondary | ICD-10-CM

## 2011-07-03 LAB — PACEMAKER DEVICE OBSERVATION
AL AMPLITUDE: 4.3 mv
AL THRESHOLD: 0.75 V
DEVICE MODEL PM: 2376403
LV LEAD IMPEDENCE PM: 580 Ohm
RV LEAD IMPEDENCE PM: 460 Ohm

## 2011-07-03 NOTE — Progress Notes (Signed)
Pacer check in clinic  

## 2011-07-06 ENCOUNTER — Encounter: Payer: Self-pay | Admitting: *Deleted

## 2011-07-06 NOTE — Progress Notes (Signed)
Remote pacer check  

## 2011-10-05 ENCOUNTER — Ambulatory Visit (INDEPENDENT_AMBULATORY_CARE_PROVIDER_SITE_OTHER): Payer: Medicare Other | Admitting: *Deleted

## 2011-10-05 ENCOUNTER — Encounter: Payer: Self-pay | Admitting: Internal Medicine

## 2011-10-05 DIAGNOSIS — I442 Atrioventricular block, complete: Secondary | ICD-10-CM

## 2011-10-06 ENCOUNTER — Encounter: Payer: Self-pay | Admitting: *Deleted

## 2011-10-06 LAB — REMOTE PACEMAKER DEVICE
ATRIAL PACING PM: 23
BRDY-0002RV: 60 {beats}/min
BRDY-0003RV: 120 {beats}/min
BRDY-0004RV: 120 {beats}/min
RV LEAD IMPEDENCE PM: 440 Ohm
RV LEAD THRESHOLD: 1.125 V
VENTRICULAR PACING PM: 95

## 2011-10-06 LAB — PACEMAKER DEVICE OBSERVATION
AL THRESHOLD: 1.625 V
BAMS-0003: 80 {beats}/min
RV LEAD IMPEDENCE PM: 437.5 Ohm

## 2011-10-31 ENCOUNTER — Encounter: Payer: Self-pay | Admitting: *Deleted

## 2011-11-21 ENCOUNTER — Other Ambulatory Visit: Payer: Self-pay | Admitting: Sports Medicine

## 2011-11-21 DIAGNOSIS — M549 Dorsalgia, unspecified: Secondary | ICD-10-CM

## 2011-11-22 ENCOUNTER — Ambulatory Visit
Admission: RE | Admit: 2011-11-22 | Discharge: 2011-11-22 | Disposition: A | Discharge status: Home / Self Care | Payer: Medicare Other | Source: Ambulatory Visit | Attending: Sports Medicine | Admitting: Sports Medicine

## 2011-11-22 DIAGNOSIS — M549 Dorsalgia, unspecified: Secondary | ICD-10-CM

## 2011-11-22 MED ORDER — IOHEXOL 180 MG/ML  SOLN
1.0000 mL | Freq: Once | INTRAMUSCULAR | Status: AC | PRN
  Administered 2011-11-22: 1 mL via EPIDURAL

## 2011-11-22 MED ORDER — METHYLPREDNISOLONE ACETATE 40 MG/ML INJ SUSP (RADIOLOG
120.0000 mg | Freq: Once | INTRAMUSCULAR | Status: AC
  Administered 2011-11-22: 120 mg via EPIDURAL

## 2012-01-08 ENCOUNTER — Encounter: Payer: Self-pay | Admitting: Internal Medicine

## 2012-01-08 ENCOUNTER — Ambulatory Visit (INDEPENDENT_AMBULATORY_CARE_PROVIDER_SITE_OTHER): Payer: Medicare Other | Admitting: *Deleted

## 2012-01-08 DIAGNOSIS — Z950 Presence of cardiac pacemaker: Secondary | ICD-10-CM

## 2012-01-08 LAB — REMOTE PACEMAKER DEVICE
AL AMPLITUDE: 2.2 mv
BAMS-0001: 150 {beats}/min
DEVICE MODEL PM: 2376403
LV LEAD IMPEDENCE PM: 550 Ohm
RV LEAD THRESHOLD: 1 V

## 2012-01-18 ENCOUNTER — Encounter: Payer: Self-pay | Admitting: *Deleted

## 2012-04-25 ENCOUNTER — Other Ambulatory Visit: Payer: Self-pay | Admitting: *Deleted

## 2012-04-25 MED ORDER — FUROSEMIDE 40 MG PO TABS: 40.0000 mg | ORAL_TABLET | Freq: Two times a day (BID) | ORAL | Status: DC

## 2012-06-05 ENCOUNTER — Other Ambulatory Visit: Payer: Self-pay | Admitting: *Deleted

## 2012-06-05 MED ORDER — FUROSEMIDE 40 MG PO TABS: 40.0000 mg | ORAL_TABLET | Freq: Two times a day (BID) | ORAL | Status: DC

## 2012-06-20 ENCOUNTER — Ambulatory Visit (INDEPENDENT_AMBULATORY_CARE_PROVIDER_SITE_OTHER): Payer: Medicare Other | Admitting: Cardiology

## 2012-06-20 ENCOUNTER — Encounter: Payer: Self-pay | Admitting: Cardiology

## 2012-06-20 ENCOUNTER — Ambulatory Visit (INDEPENDENT_AMBULATORY_CARE_PROVIDER_SITE_OTHER)
Admission: RE | Admit: 2012-06-20 | Discharge: 2012-06-20 | Disposition: A | Discharge status: Home / Self Care | Payer: Medicare Other | Source: Ambulatory Visit | Attending: Cardiology | Admitting: Cardiology

## 2012-06-20 VITALS — BP 116/69 | HR 66 | Ht 71.0 in | Wt 215.0 lb

## 2012-06-20 DIAGNOSIS — R0689 Other abnormalities of breathing: Secondary | ICD-10-CM

## 2012-06-20 DIAGNOSIS — R0989 Other specified symptoms and signs involving the circulatory and respiratory systems: Secondary | ICD-10-CM

## 2012-06-20 DIAGNOSIS — I428 Other cardiomyopathies: Secondary | ICD-10-CM

## 2012-06-20 DIAGNOSIS — I5022 Chronic systolic (congestive) heart failure: Secondary | ICD-10-CM

## 2012-06-20 DIAGNOSIS — Z950 Presence of cardiac pacemaker: Secondary | ICD-10-CM

## 2012-06-20 DIAGNOSIS — I442 Atrioventricular block, complete: Secondary | ICD-10-CM

## 2012-06-20 LAB — PACEMAKER DEVICE OBSERVATION
AL IMPEDENCE PM: 460 Ohm
ATRIAL PACING PM: 28
BAMS-0001: 150 {beats}/min
BATTERY VOLTAGE: 2.92 V
DEVICE MODEL PM: 2376403
RV LEAD AMPLITUDE: 12 mv
RV LEAD IMPEDENCE PM: 460 Ohm

## 2012-06-20 MED ORDER — FUROSEMIDE 40 MG PO TABS: 40.0000 mg | ORAL_TABLET | Freq: Two times a day (BID) | ORAL | Status: DC

## 2012-06-20 NOTE — Patient Instructions (Addendum)
Remote monitoring is used to monitor your Pacemaker of ICD from home. This monitoring reduces the number of office visits required to check your device to one time per year. It allows [place] to keep an eye on the functioning of your device to ensure it is working properly. You are scheduled for a device check from home on 09/23/12. You may send your transmission at any time that day. If you have a wireless device, the transmission will be sent automatically. After your physician reviews your transmission, you will receive a postcard with your next transmission date.  Your physician wants you to follow-up in: 1 year with Dr Court Joy will receive a reminder letter in the mail two months in advance. If you don't receive a letter, please call our office to schedule the follow-up appointment.  A chest x-ray takes a picture of the organs and structures inside the chest, including the heart, lungs, and blood vessels. This test can show several things, including, whether the heart is enlarges; whether fluid is building up in the lungs; and whether pacemaker / defibrillator leads are still in place.

## 2012-06-20 NOTE — Progress Notes (Signed)
ELECTROPHYSIOLOGY OFFICE NOTE  Patient ID: Louis Acosta MRN: [id number], DOB/AGE: 05-13-1931   Date of Visit: 06/20/2012  Primary Physician: Pearla Dubonnet, MD Primary Cardiologist: Eldridge Dace, MD Primary EP: Lewayne Bunting, MD Reason for Visit: EP/device follow-up  History of Present Illness  Louis Acosta is a 79 year old man with a long-standing NICM, complete heart block and chronic systolic heart failure status post bi-ventricular pacemaker insertion. His heart failure symptoms remain class II. He has noted chest pain with exertion. He takes nitroglycerin as needed for control. He has been followed by Dr. Ladona Ridgel. He presents today with his wife for routine electrophysiology followup. Since last being seen in our clinic, he reports he is doing well. He has no complaints.  Today, he specifically denies chest pain or shortness of breath. He denies palpitations, dizziness, near syncope or syncope. He denies LE swelling, orthopnea, PND or recent weight gain. Louis Acosta reports he is compliant and tolerating medications without difficulty.  Past Medical History Past Medical History  Diagnosis Date  . Cardiomyopathy     broken 3x's  . History of fracture of nose   . Diabetes mellitus     type 2  . Hyperlipidemia   . Arthritis     Past Surgical History Past Surgical History  Procedure Laterality Date  . Transthoracic echocardiogram  01/2007     Allergies/Intolerances No Known Allergies  Current Home Medications Current Outpatient Prescriptions  Medication Sig Dispense Refill  . B Complex Vitamins (VITAMIN-B COMPLEX PO) Take 1 tablet by mouth daily.        Marland Kitchen [employer] MART UNILET LANCETS 30G MISC by Does not apply route.        . fish oil-omega-3 fatty acids 1000 MG capsule Take 2 g by mouth daily.        . furosemide (LASIX) 40 MG tablet Take 1 tablet (40 mg total) by mouth 2 (two) times daily.  60 tablet  0  . Glucose Blood (BAYER BREEZE 2 TEST VI) by In Vitro route 4 (four)  times daily.        . insulin lispro (HUMALOG) 100 UNIT/ML injection Inject 10-20 Units into the skin every evening.        . loratadine (CLARITIN) 10 MG tablet Take 10 mg by mouth daily as needed.        Marland Kitchen losartan (COZAAR) 25 MG tablet Take 25 mg by mouth daily.        . metoprolol (LOPRESSOR) 50 MG tablet Take 50 mg by mouth 2 (two) times daily. Takes 2 tablets in the morning and 1 tablet in the evening.       . Misc Natural Products (OSTEO BI-FLEX TRIPLE STRENGTH PO) Take by mouth as directed.        . Multiple Vitamins-Minerals (MULTIVITAMIN WITH MINERALS) tablet Take 1 tablet by mouth daily.        . nitroGLYCERIN (NITROSTAT) 0.4 MG SL tablet Place 0.4 mg under the tongue every 5 (five) minutes as needed.        Marland Kitchen POTASSIUM CHLORIDE PO Take 595 mg by mouth daily.        . saw palmetto 500 MG capsule Take 450 mg by mouth daily.        . simvastatin (ZOCOR) 40 MG tablet Take 40 mg by mouth every evening.        . travoprost, benzalkonium, (TRAVATAN) 0.004 % ophthalmic solution Place 1 drop into both eyes at bedtime.  No current facility-administered medications for this visit.    Social History Social History  . Marital Status: Married   Occupational History  . retired    Social History Main Topics  . Smoking status: Never Smoker   . Smokeless tobacco: No  . Alcohol Use: No  . Drug Use: No   Review of Systems General: No chills, fever, night sweats or weight changes Cardiovascular: No chest pain, dyspnea on exertion, edema, orthopnea, palpitations, paroxysmal nocturnal dyspnea Dermatological: No rash, lesions or masses Respiratory: No cough, dyspnea Urologic: No hematuria, dysuria Abdominal: No nausea, vomiting, diarrhea, bright red blood per rectum, melena, or hematemesis Neurologic: No visual changes, weakness, changes in mental status All other systems reviewed and are otherwise negative except as noted above.  Physical Exam Blood pressure 116/69, pulse 66,  height 5\' 11"  (1.803 m), weight 215 lb (97.523 kg), SpO2 91.00%.  General: Well developed, well appearing 79 year old male in no acute distress. HEENT: Normocephalic, atraumatic. EOMs intact. Sclera nonicteric. Oropharynx clear.  Neck: Supple. No JVD. Lungs: Respirations regular and unlabored. Diminished breath sounds on left otherwise CTA bilaterally. No wheezes, rales or rhonchi. Heart: RRR. S1, S2 present. No murmurs, rub, S3 or S4. Abdomen: Soft, non-distended.  Extremities: No clubbing, cyanosis or edema. PT/Radials 2+ and equal bilaterally. Psych: Normal affect. Neuro: Alert and oriented X 3. Moves all extremities spontaneously.   Diagnostics Device interrogation today - Normal device function. Thresholds, sensing, impedance consistent with previous measurements. Histograms appropriate for patient and level of activity. 41 mode switches, <1% of the time, longest 5 minutes, EGM consistent with an atrial tachycardia (reviewed w/GT). No ventricular high rate episodes. Patient bi-ventricularly pacing 93% of the time. Device programmed with appropriate safety margins. Device heart failure diagnostics are within normal limits and stable over time. Estimated longevity 4.6 years.   Assessment and Plan 1. Decreased breath sounds on left Louis Acosta has no symptoms and specifically denies SOB, cough, fever or prior lung surgery. He denies increased swelling, orthopnea or PND. No recent CXR Will order CXR today - further recs pending results 2. CHB s/p BiV PPM implant Normal device function No programming changes made Continue remote device follow-up every 3 months Return to clinic for follow-up with Dr. Ladona Ridgel in one year  Signed, Rick Duff, PA-C 06/20/2012, 9:36 AM

## 2012-07-11 ENCOUNTER — Telehealth: Payer: Self-pay | Admitting: Internal Medicine

## 2012-07-11 NOTE — Telephone Encounter (Signed)
New problem    Pt has a sore in pacemaker area and wire is not visible

## 2012-07-11 NOTE — Telephone Encounter (Addendum)
Sore on neck where new wires are.  PA at [facility] placed him on Doxycycline 100mg  bid  It is possible that this was a stitch that was trying to work its way out.  I have asked them to come in tomorrow at 11am to have this looked at.  It just started this week

## 2012-07-12 ENCOUNTER — Other Ambulatory Visit: Payer: Self-pay | Admitting: Internal Medicine

## 2012-07-12 ENCOUNTER — Ambulatory Visit (INDEPENDENT_AMBULATORY_CARE_PROVIDER_SITE_OTHER): Payer: Medicare Other | Admitting: *Deleted

## 2012-07-12 DIAGNOSIS — I442 Atrioventricular block, complete: Secondary | ICD-10-CM

## 2012-07-12 LAB — PACEMAKER DEVICE OBSERVATION: DEVICE MODEL PM: 2376403

## 2012-07-12 NOTE — Patient Instructions (Addendum)
You may take the bandage off the wound when you get home and leave open to air.  Wash the area twice daily with warm soap and water.  Continue taking your Doxycycline 100mg  twice daily.  Return office visit 07/18/12 @11 :30am.  If you develop fever or chills go to [facility] ED.  If the wound or surrounding area becomes more swollen or red go to [facility] ED.

## 2012-07-12 NOTE — Telephone Encounter (Signed)
Patient saw Dr Ladona Ridgel and will continue on antibiotics and return as scheduled

## 2012-07-12 NOTE — Progress Notes (Signed)
Wound re check. 

## 2012-07-17 ENCOUNTER — Encounter: Payer: Self-pay | Admitting: Internal Medicine

## 2012-07-18 ENCOUNTER — Ambulatory Visit (INDEPENDENT_AMBULATORY_CARE_PROVIDER_SITE_OTHER): Payer: Medicare Other | Admitting: *Deleted

## 2012-07-18 ENCOUNTER — Other Ambulatory Visit: Payer: Self-pay | Admitting: Internal Medicine

## 2012-07-18 DIAGNOSIS — I442 Atrioventricular block, complete: Secondary | ICD-10-CM

## 2012-07-18 LAB — PACEMAKER DEVICE OBSERVATION: DEVICE MODEL PM: 2376403

## 2012-07-18 MED ORDER — DOXYCYCLINE HYCLATE 100 MG PO CAPS: 100.0000 mg | ORAL_CAPSULE | Freq: Two times a day (BID) | ORAL | Status: AC

## 2012-07-18 NOTE — Progress Notes (Signed)
Wound re check. 

## 2012-08-01 ENCOUNTER — Ambulatory Visit (INDEPENDENT_AMBULATORY_CARE_PROVIDER_SITE_OTHER): Payer: Medicare Other | Admitting: *Deleted

## 2012-08-01 DIAGNOSIS — I442 Atrioventricular block, complete: Secondary | ICD-10-CM

## 2012-08-01 LAB — PACEMAKER DEVICE OBSERVATION: DEVICE MODEL PM: 2376403

## 2012-08-01 NOTE — Progress Notes (Signed)
Wound re check. 

## 2012-08-13 ENCOUNTER — Ambulatory Visit (INDEPENDENT_AMBULATORY_CARE_PROVIDER_SITE_OTHER): Payer: Medicare Other | Admitting: Internal Medicine

## 2012-08-13 ENCOUNTER — Encounter: Payer: Self-pay | Admitting: Internal Medicine

## 2012-08-13 ENCOUNTER — Telehealth: Payer: Self-pay | Admitting: Internal Medicine

## 2012-08-13 VITALS — BP 137/71 | HR 67 | Ht 71.0 in | Wt 218.0 lb

## 2012-08-13 DIAGNOSIS — T827XXD Infection and inflammatory reaction due to other cardiac and vascular devices, implants and grafts, subsequent encounter: Secondary | ICD-10-CM

## 2012-08-13 DIAGNOSIS — Z950 Presence of cardiac pacemaker: Secondary | ICD-10-CM

## 2012-08-13 DIAGNOSIS — Z5189 Encounter for other specified aftercare: Secondary | ICD-10-CM

## 2012-08-13 DIAGNOSIS — I442 Atrioventricular block, complete: Secondary | ICD-10-CM

## 2012-08-13 DIAGNOSIS — T827XXA Infection and inflammatory reaction due to other cardiac and vascular devices, implants and grafts, initial encounter: Secondary | ICD-10-CM

## 2012-08-13 MED ORDER — CLINDAMYCIN HCL 300 MG PO CAPS: 300.0000 mg | ORAL_CAPSULE | Freq: Three times a day (TID) | ORAL | Status: DC

## 2012-08-13 NOTE — Progress Notes (Addendum)
ELECTROPHYSIOLOGY OFFICE NOTE  Patient ID: Louis Acosta MRN: [id number], DOB/AGE: 11/11/1931   Date of Visit: 08/13/2012  Primary Physician: Pearla Dubonnet, MD Primary EP: Ladona Ridgel, MD Reason for Visit: Wound re-check  History of Present Illness  Louis Acosta is a 79 year old man with a long-standing NICM, complete heart block and chronic systolic heart failure status post bi-ventricular pacemaker insertion. His heart failure symptoms remain class II. He has noted chest pain with exertion. He takes nitroglycerin as needed for control. He has been followed by Dr. Ladona Ridgel.   He presents today with his wife for wound re-check. He has a poor healing wound on the lower right neck, ongoing since mid April. He has been treated with doxycycline. This AM his wife reports it was draining pus. He also states it is quite tender to touch. He denies fever or chills. Otherwise he has no complaints. He denies CP. He has chronic DOE but he denies worsening. He denies palpitations, dizziness, near syncope or syncope. He denies LE swelling, orthopnea or PND.   Past Medical History Past Medical History  Diagnosis Date  . Cardiomyopathy     broken 3x's  . History of fracture of nose   . Diabetes mellitus     type 2  . Hyperlipidemia   . Arthritis     Past Surgical History Past Surgical History  Procedure Laterality Date  . Transthoracic echocardiogram  01/2007    Allergies/Intolerances No Known Allergies  Current Home Medications Current Outpatient Prescriptions  Medication Sig Dispense Refill  . B Complex Vitamins (VITAMIN-B COMPLEX PO) Take 1 tablet by mouth daily.        . dorzolamide (TRUSOPT) 2 % ophthalmic solution as directed.      Marland Kitchen [employer]UG MART UNILET LANCETS 30G MISC by Does not apply route.        . fish oil-omega-3 fatty acids 1000 MG capsule Take 2 g by mouth daily.        . fluticasone (FLONASE) 50 MCG/ACT nasal spray as needed.      . furosemide (LASIX) 40 MG tablet Take 1  tablet (40 mg total) by mouth 2 (two) times daily.  180 tablet  3  . Glucose Blood (BAYER BREEZE 2 TEST VI) by In Vitro route 4 (four) times daily.        . insulin lispro (HUMALOG) 100 UNIT/ML injection Inject 10-20 Units into the skin every evening.        Marland Kitchen LANTUS 100 UNIT/ML injection as directed.      . loratadine (CLARITIN) 10 MG tablet Take 10 mg by mouth daily as needed.        Marland Kitchen losartan (COZAAR) 25 MG tablet Take 25 mg by mouth daily.        . metoprolol (LOPRESSOR) 50 MG tablet Take 50 mg by mouth 2 (two) times daily. Takes 2 tablets in the morning and 1 tablet in the evening.       . Misc Natural Products (OSTEO BI-FLEX TRIPLE STRENGTH PO) Take by mouth as directed.        . Multiple Vitamins-Minerals (MULTIVITAMIN WITH MINERALS) tablet Take 1 tablet by mouth daily.        . nitroGLYCERIN (NITROSTAT) 0.4 MG SL tablet Place 0.4 mg under the tongue every 5 (five) minutes as needed.        Marland Kitchen POTASSIUM CHLORIDE PO Take 595 mg by mouth daily.        . saw palmetto 500 MG capsule Take  450 mg by mouth daily.        . simvastatin (ZOCOR) 40 MG tablet Take 40 mg by mouth every evening.        . travoprost, benzalkonium, (TRAVATAN) 0.004 % ophthalmic solution Place 1 drop into both eyes at bedtime.         No current facility-administered medications for this visit.   Social History Social History  . Marital Status: Married   Occupational History  . retired    Social History Main Topics  . Smoking status: Never Smoker   . Smokeless tobacco: No  . Alcohol Use: No  . Drug Use: No   Review of Systems General: No chills, fever, night sweats or weight changes Cardiovascular: No chest pain, dyspnea on exertion, edema, orthopnea, palpitations, paroxysmal nocturnal dyspnea Dermatological: No rash, lesions or masses Respiratory: No cough, dyspnea Urologic: No hematuria, dysuria Abdominal: No nausea, vomiting, diarrhea, bright red blood per rectum, melena, or hematemesis Neurologic: No  visual changes, weakness, changes in mental status All other systems reviewed and are otherwise negative except as noted above.  Physical Exam Blood pressure 137/71, pulse 67, height 5\' 11"  (1.803 m), weight 218 lb (98.884 kg).  General: Well developed, well appearing 79 year old male in no acute distress. HEENT: Normocephalic, atraumatic. EOMs intact. Sclera nonicteric. Oropharynx clear.  Neck: Supple. No JVD. Lungs: Respirations regular and unlabored, CTA bilaterally. No wheezes, rales or rhonchi. Heart: Regular. S1, S2 present. No murmurs, rub, S3 or S4. Abdomen: Soft, non-distended. Extremities: No clubbing, cyanosis or edema. PT/Radials 2+ and equal bilaterally. Psych: Normal affect. Neuro: Alert and oriented X 3. Moves all extremities spontaneously. Skin: Right lower neck is erythematous and indurated, tender to palpation over area of visible lead curvature in the right IJ. Small area of eshcar inferiorly. No active drainage or bleeding.    Assessment and Plan 1. BiV PPM lead infection 2. CHB s/p BiV PPM implant 3. NICM with chronic systolic HF  Louis Acosta presents with an infected LV lead in the right IJ. Initially, it was felt to be a superficial skin infection; however, he has completed a course of doxycycline without improvement. In fact, the site remains erythematous, tender, now indurated with persistent drainage and eschar. Dr. Ladona Ridgel has reviewed the recent CXR and device interrogation. Louis Acosta is pacemaker dependent. Two possible treatment options were reviewed with him and his wife. Option #1 - Enter the pocket on the left and extract the LV lead, then close the pocket and continue antibiotics for several weeks. Once the right neck is healed, reimplant a new LV lead, possibly using the left IJ. Option #2 - Insert temp-perm pacemaker via left IJ and extract entire BiV PPM system, treat with antibiotics then reimplant new BiV PPM system after several weeks on the right. After  detailed discussion regarding options, Louis Acosta has elected to proceed with option #1. The risks and benefits were reviewed in detail. These risks include but are not limited to death, stroke, MI, bleeding, infection, perforation, tamponade, lead dislodgement, vascular damage or renal failure. Mr. Archila and his wife expressed verbal understanding and agree to proceed.     The above plan of care was formulated with Dr. Ladona Ridgel who was in to interview and examine the patient. Rick Duff, PA-C 08/13/2012  EP Attending  Patient seen and examined. I have reviewed the findings above with Rick Duff, PA-C and together we have formulated the plan as noted above. Mr. Rathbone has a very difficult problem. He has  ICD lead that has been in place for 10 years and I would hope to avoid trying to extract this lead.  Leonia Reeves.D.

## 2012-08-13 NOTE — Patient Instructions (Addendum)
Will call patient with time to be at hospital next Wed .  Will need to ne at the hospital at 6:30am for an 8:30am case  Nothing to eat or drink after midnight  Do not take any medications the morning of your procedure

## 2012-08-13 NOTE — Telephone Encounter (Signed)
New problem    Per pts wife-pt may have an infection around the wires of his pacemaker-wants to know if he should be seen today or if he'll be ok until Thursday's f/u appt w/dr taylor

## 2012-08-13 NOTE — Telephone Encounter (Signed)
Will add on to his schedule today and have Dr Ladona Ridgel look at site

## 2012-08-15 ENCOUNTER — Encounter: Payer: Medicare Other | Admitting: Cardiology

## 2012-08-16 ENCOUNTER — Emergency Department (HOSPITAL_COMMUNITY): Payer: Medicare Other

## 2012-08-16 ENCOUNTER — Encounter (HOSPITAL_COMMUNITY): Payer: Self-pay | Admitting: *Deleted

## 2012-08-16 ENCOUNTER — Inpatient Hospital Stay (HOSPITAL_COMMUNITY)
Admission: EM | Admit: 2012-08-16 | Discharge: 2012-08-22 | DRG: 260 | Disposition: A | Discharge status: Home / Self Care | Payer: Medicare Other | Attending: Internal Medicine | Admitting: Internal Medicine

## 2012-08-16 ENCOUNTER — Telehealth: Payer: Self-pay | Admitting: Internal Medicine

## 2012-08-16 DIAGNOSIS — I208 Other forms of angina pectoris: Secondary | ICD-10-CM

## 2012-08-16 DIAGNOSIS — Y831 Surgical operation with implant of artificial internal device as the cause of abnormal reaction of the patient, or of later complication, without mention of misadventure at the time of the procedure: Secondary | ICD-10-CM | POA: Diagnosis present

## 2012-08-16 DIAGNOSIS — M129 Arthropathy, unspecified: Secondary | ICD-10-CM | POA: Diagnosis present

## 2012-08-16 DIAGNOSIS — I442 Atrioventricular block, complete: Secondary | ICD-10-CM

## 2012-08-16 DIAGNOSIS — I509 Heart failure, unspecified: Secondary | ICD-10-CM | POA: Diagnosis present

## 2012-08-16 DIAGNOSIS — T827XXA Infection and inflammatory reaction due to other cardiac and vascular devices, implants and grafts, initial encounter: Principal | ICD-10-CM | POA: Diagnosis present

## 2012-08-16 DIAGNOSIS — D696 Thrombocytopenia, unspecified: Secondary | ICD-10-CM | POA: Diagnosis present

## 2012-08-16 DIAGNOSIS — I5022 Chronic systolic (congestive) heart failure: Secondary | ICD-10-CM

## 2012-08-16 DIAGNOSIS — E785 Hyperlipidemia, unspecified: Secondary | ICD-10-CM | POA: Diagnosis present

## 2012-08-16 DIAGNOSIS — Z950 Presence of cardiac pacemaker: Secondary | ICD-10-CM

## 2012-08-16 DIAGNOSIS — I428 Other cardiomyopathies: Secondary | ICD-10-CM | POA: Diagnosis present

## 2012-08-16 DIAGNOSIS — I2510 Atherosclerotic heart disease of native coronary artery without angina pectoris: Secondary | ICD-10-CM | POA: Diagnosis present

## 2012-08-16 DIAGNOSIS — R0902 Hypoxemia: Secondary | ICD-10-CM | POA: Diagnosis present

## 2012-08-16 DIAGNOSIS — E119 Type 2 diabetes mellitus without complications: Secondary | ICD-10-CM | POA: Diagnosis present

## 2012-08-16 DIAGNOSIS — I5023 Acute on chronic systolic (congestive) heart failure: Secondary | ICD-10-CM | POA: Diagnosis present

## 2012-08-16 DIAGNOSIS — R0689 Other abnormalities of breathing: Secondary | ICD-10-CM

## 2012-08-16 DIAGNOSIS — E873 Alkalosis: Secondary | ICD-10-CM | POA: Diagnosis not present

## 2012-08-16 DIAGNOSIS — F29 Unspecified psychosis not due to a substance or known physiological condition: Secondary | ICD-10-CM | POA: Diagnosis not present

## 2012-08-16 DIAGNOSIS — I10 Essential (primary) hypertension: Secondary | ICD-10-CM

## 2012-08-16 LAB — POCT I-STAT 3, ART BLOOD GAS (G3+)
O2 Saturation: 81 %
pCO2 arterial: 58.3 mmHg (ref 35.0–45.0)
pH, Arterial: 7.352 (ref 7.350–7.450)
pO2, Arterial: 48 mmHg — ABNORMAL LOW (ref 80.0–100.0)

## 2012-08-16 LAB — URINALYSIS, ROUTINE W REFLEX MICROSCOPIC
Bilirubin Urine: NEGATIVE
Hgb urine dipstick: NEGATIVE
Ketones, ur: NEGATIVE mg/dL
Nitrite: NEGATIVE
Protein, ur: NEGATIVE mg/dL
Specific Gravity, Urine: 1.014 (ref 1.005–1.030)
Urobilinogen, UA: 0.2 mg/dL (ref 0.0–1.0)

## 2012-08-16 LAB — MRSA PCR SCREENING: MRSA by PCR: NEGATIVE

## 2012-08-16 LAB — PRO B NATRIURETIC PEPTIDE: Pro B Natriuretic peptide (BNP): 1259 pg/mL — ABNORMAL HIGH (ref 0–450)

## 2012-08-16 LAB — CBC WITH DIFFERENTIAL/PLATELET
Basophils Absolute: 0 10*3/uL (ref 0.0–0.1)
Basophils Relative: 0 % (ref 0–1)
Eosinophils Absolute: 0.1 10*3/uL (ref 0.0–0.7)
Eosinophils Relative: 2 % (ref 0–5)
MCH: 32.8 pg (ref 26.0–34.0)
MCHC: 34.3 g/dL (ref 30.0–36.0)
MCV: 95.6 fL (ref 78.0–100.0)
Neutrophils Relative %: 58 % (ref 43–77)
Platelets: 93 10*3/uL — ABNORMAL LOW (ref 150–400)
RBC: 4.33 MIL/uL (ref 4.22–5.81)
RDW: 12.9 % (ref 11.5–15.5)

## 2012-08-16 LAB — COMPREHENSIVE METABOLIC PANEL
ALT: 18 U/L (ref 0–53)
Albumin: 4 g/dL (ref 3.5–5.2)
Alkaline Phosphatase: 98 U/L (ref 39–117)
Calcium: 9.5 mg/dL (ref 8.4–10.5)
GFR calc non Af Amer: 78 mL/min — ABNORMAL LOW (ref >90)
Glucose, Bld: 231 mg/dL — ABNORMAL HIGH (ref 70–99)
Potassium: 4.4 mEq/L (ref 3.5–5.1)
Sodium: 136 mEq/L (ref 135–145)
Total Protein: 7.6 g/dL (ref 6.0–8.3)

## 2012-08-16 LAB — GLUCOSE, CAPILLARY: Glucose-Capillary: 130 mg/dL — ABNORMAL HIGH (ref 70–99)

## 2012-08-16 LAB — SEDIMENTATION RATE: Sed Rate: 14 mm/hr (ref 0–16)

## 2012-08-16 MED ORDER — VANCOMYCIN HCL 10 G IV SOLR
1250.0000 mg | Freq: Two times a day (BID) | INTRAVENOUS | Status: DC
  Administered 2012-08-17: 1250 mg via INTRAVENOUS
  Filled 2012-08-16 (×2): qty 1250

## 2012-08-16 MED ORDER — MULTI-VITAMIN/MINERALS PO TABS: 1.0000 | ORAL_TABLET | Freq: Every day | ORAL | Status: DC

## 2012-08-16 MED ORDER — SODIUM CHLORIDE 0.9 % IV SOLN: 250.0000 mL | INTRAVENOUS | Status: DC | PRN

## 2012-08-16 MED ORDER — B COMPLEX-C PO TABS
1.0000 | ORAL_TABLET | Freq: Every day | ORAL | Status: DC
  Administered 2012-08-17 – 2012-08-22 (×6): 1 via ORAL
  Filled 2012-08-16 (×6): qty 1

## 2012-08-16 MED ORDER — ONDANSETRON 4 MG PO TBDP
8.0000 mg | ORAL_TABLET | Freq: Once | ORAL | Status: AC
  Administered 2012-08-16: 8 mg via ORAL
  Filled 2012-08-16: qty 2

## 2012-08-16 MED ORDER — OMEGA-3 FATTY ACIDS 1000 MG PO CAPS
1.0000 g | ORAL_CAPSULE | Freq: Every day | ORAL | Status: DC
  Administered 2012-08-17 – 2012-08-22 (×6): 1 g via ORAL
  Filled 2012-08-16 (×7): qty 1

## 2012-08-16 MED ORDER — FLUTICASONE PROPIONATE 50 MCG/ACT NA SUSP: 1.0000 | Freq: Every day | NASAL | Status: DC | PRN

## 2012-08-16 MED ORDER — ONDANSETRON HCL 4 MG/2ML IJ SOLN
4.0000 mg | Freq: Four times a day (QID) | INTRAMUSCULAR | Status: DC | PRN
  Administered 2012-08-20 – 2012-08-21 (×2): 4 mg via INTRAVENOUS
  Filled 2012-08-16 (×2): qty 2

## 2012-08-16 MED ORDER — FUROSEMIDE 10 MG/ML IJ SOLN
40.0000 mg | Freq: Once | INTRAMUSCULAR | Status: AC
  Administered 2012-08-16: 40 mg via INTRAVENOUS
  Filled 2012-08-16: qty 4

## 2012-08-16 MED ORDER — TRAVOPROST 0.004 % OP SOLN
1.0000 [drp] | Freq: Every day | OPHTHALMIC | Status: DC
  Filled 2012-08-16 (×9): qty 0.1

## 2012-08-16 MED ORDER — METOPROLOL TARTRATE 50 MG PO TABS
50.0000 mg | ORAL_TABLET | Freq: Every day | ORAL | Status: DC
  Administered 2012-08-16: 50 mg via ORAL
  Filled 2012-08-16 (×2): qty 1

## 2012-08-16 MED ORDER — LORATADINE 10 MG PO TABS
10.0000 mg | ORAL_TABLET | Freq: Every day | ORAL | Status: DC | PRN
  Filled 2012-08-16: qty 1

## 2012-08-16 MED ORDER — TRAVOPROST (BAK FREE) 0.004 % OP SOLN
1.0000 [drp] | Freq: Every day | OPHTHALMIC | Status: DC
  Administered 2012-08-16 – 2012-08-21 (×6): 1 [drp] via OPHTHALMIC
  Filled 2012-08-16: qty 2.5

## 2012-08-16 MED ORDER — POTASSIUM GLUCONATE 595 (99 K) MG PO TABS: 595.0000 mg | ORAL_TABLET | Freq: Every day | ORAL | Status: DC

## 2012-08-16 MED ORDER — INSULIN GLARGINE 100 UNIT/ML ~~LOC~~ SOLN
50.0000 [IU] | Freq: Every day | SUBCUTANEOUS | Status: DC
  Administered 2012-08-17 – 2012-08-20 (×4): 50 [IU] via SUBCUTANEOUS
  Filled 2012-08-16 (×7): qty 0.5

## 2012-08-16 MED ORDER — NITROGLYCERIN 0.4 MG SL SUBL: 0.4000 mg | SUBLINGUAL_TABLET | SUBLINGUAL | Status: DC | PRN

## 2012-08-16 MED ORDER — FUROSEMIDE 40 MG PO TABS
40.0000 mg | ORAL_TABLET | Freq: Two times a day (BID) | ORAL | Status: DC
  Administered 2012-08-17: 40 mg via ORAL
  Filled 2012-08-16 (×3): qty 1

## 2012-08-16 MED ORDER — SIMVASTATIN 40 MG PO TABS
40.0000 mg | ORAL_TABLET | Freq: Every day | ORAL | Status: DC
  Administered 2012-08-16 – 2012-08-22 (×7): 40 mg via ORAL
  Filled 2012-08-16 (×8): qty 1

## 2012-08-16 MED ORDER — METOPROLOL TARTRATE 50 MG PO TABS: 50.0000 mg | ORAL_TABLET | Freq: Two times a day (BID) | ORAL | Status: DC

## 2012-08-16 MED ORDER — INSULIN ASPART 100 UNIT/ML ~~LOC~~ SOLN
0.0000 [IU] | Freq: Three times a day (TID) | SUBCUTANEOUS | Status: DC
  Administered 2012-08-17 – 2012-08-18 (×4): 2 [IU] via SUBCUTANEOUS
  Administered 2012-08-18: 3 [IU] via SUBCUTANEOUS
  Administered 2012-08-18 – 2012-08-19 (×2): 2 [IU] via SUBCUTANEOUS
  Administered 2012-08-19 (×2): 1 [IU] via SUBCUTANEOUS
  Administered 2012-08-20: 2 [IU] via SUBCUTANEOUS
  Administered 2012-08-20: 1 [IU] via SUBCUTANEOUS
  Administered 2012-08-20 – 2012-08-22 (×4): 2 [IU] via SUBCUTANEOUS
  Administered 2012-08-22: 1 [IU] via SUBCUTANEOUS

## 2012-08-16 MED ORDER — ADULT MULTIVITAMIN W/MINERALS CH
1.0000 | ORAL_TABLET | Freq: Every day | ORAL | Status: DC
  Administered 2012-08-17 – 2012-08-22 (×6): 1 via ORAL
  Filled 2012-08-16 (×7): qty 1

## 2012-08-16 MED ORDER — DORZOLAMIDE HCL 2 % OP SOLN
1.0000 [drp] | Freq: Two times a day (BID) | OPHTHALMIC | Status: DC
  Administered 2012-08-16 – 2012-08-22 (×12): 1 [drp] via OPHTHALMIC
  Filled 2012-08-16 (×2): qty 10

## 2012-08-16 MED ORDER — SODIUM CHLORIDE 0.9 % IJ SOLN: 3.0000 mL | INTRAMUSCULAR | Status: DC | PRN

## 2012-08-16 MED ORDER — ACETAMINOPHEN 325 MG PO TABS
650.0000 mg | ORAL_TABLET | ORAL | Status: DC | PRN
  Administered 2012-08-17 – 2012-08-18 (×2): 650 mg via ORAL
  Filled 2012-08-16 (×2): qty 2

## 2012-08-16 MED ORDER — LOSARTAN POTASSIUM 25 MG PO TABS
25.0000 mg | ORAL_TABLET | Freq: Every day | ORAL | Status: DC
  Administered 2012-08-17 – 2012-08-22 (×6): 25 mg via ORAL
  Filled 2012-08-16 (×7): qty 1

## 2012-08-16 MED ORDER — VITAMIN-B COMPLEX PO TABS: 1.0000 | ORAL_TABLET | Freq: Every day | ORAL | Status: DC

## 2012-08-16 MED ORDER — METOPROLOL TARTRATE 100 MG PO TABS
100.0000 mg | ORAL_TABLET | Freq: Every day | ORAL | Status: DC
  Filled 2012-08-16 (×2): qty 1

## 2012-08-16 MED ORDER — VANCOMYCIN HCL IN DEXTROSE 1-5 GM/200ML-% IV SOLN
1000.0000 mg | Freq: Once | INTRAVENOUS | Status: AC
  Administered 2012-08-16: 1000 mg via INTRAVENOUS
  Filled 2012-08-16: qty 200

## 2012-08-16 MED ORDER — FUROSEMIDE 20 MG PO TABS: 40.0000 mg | ORAL_TABLET | Freq: Two times a day (BID) | ORAL | Status: DC

## 2012-08-16 MED ORDER — SODIUM CHLORIDE 0.9 % IJ SOLN
3.0000 mL | Freq: Two times a day (BID) | INTRAMUSCULAR | Status: DC
  Administered 2012-08-16 – 2012-08-22 (×12): 3 mL via INTRAVENOUS

## 2012-08-16 MED ORDER — HEPARIN SODIUM (PORCINE) 5000 UNIT/ML IJ SOLN
5000.0000 [IU] | Freq: Three times a day (TID) | INTRAMUSCULAR | Status: DC
  Administered 2012-08-16 – 2012-08-17 (×2): 5000 [IU] via SUBCUTANEOUS
  Filled 2012-08-16 (×5): qty 1

## 2012-08-16 NOTE — ED Notes (Signed)
Attempted report 

## 2012-08-16 NOTE — ED Notes (Signed)
RT notified of need for ABG with pt off of [place] per Dr. Lubertha Basque request. Pt remains off of O2 [address] at this time

## 2012-08-16 NOTE — ED Notes (Signed)
Cardiologist notified of AGB results. States will change level of care to step-down

## 2012-08-16 NOTE — ED Provider Notes (Signed)
History     CSN: [id number]  Arrival date & time 08/16/12  1532   First MD Initiated Contact with Patient 08/16/12 1629      Chief Complaint  Patient presents with  . poss infected pacemaker     (Consider location/radiation/quality/duration/timing/severity/associated sxs/prior treatment) The history is provided by the patient.   79 year old male has a permanent pacemaker and has had problems with infection with one of the leads. There is an area in the right side of his neck which is red and intermittently draining some pus. He denies fever, chills, sweats. He denies note change in his baseline orthopnea. Denies dyspnea on exertion. He he denies chest pain, heaviness, tightness, pressure. He completed a course of antibiotics as an outpatient with no improvement.  Past Medical History  Diagnosis Date  . Cardiomyopathy     broken 3x's  . History of fracture of nose   . Diabetes mellitus     type 2  . Hyperlipidemia   . Arthritis     Past Surgical History  Procedure Laterality Date  . Transthoracic echocardiogram  01/2007    No family history on file.  History  Substance Use Topics  . Smoking status: Never Smoker   . Smokeless tobacco: Not on file  . Alcohol Use: No      Review of Systems  All other systems reviewed and are negative.    Allergies  Clindamycin/lincomycin  Home Medications   Current Outpatient Rx  Name  Route  Sig  Dispense  Refill  . B Complex Vitamins (VITAMIN-B COMPLEX PO)   Oral   Take 1 tablet by mouth daily.           . clindamycin (CLEOCIN) 300 MG capsule   Oral   Take 1 capsule (300 mg total) by mouth 3 (three) times daily. For 5 days then stop.   15 capsule   1   . dorzolamide (TRUSOPT) 2 % ophthalmic solution      as directed.         Marland Kitchen [facility] [facility] UNILET LANCETS 30G MISC   Does not apply   by Does not apply route.           . fish oil-omega-3 fatty acids 1000 MG capsule   Oral   Take 2 g by mouth daily.            . fluticasone (FLONASE) 50 MCG/ACT nasal spray      as needed.         . furosemide (LASIX) 40 MG tablet   Oral   Take 1 tablet (40 mg total) by mouth 2 (two) times daily.   180 tablet   3   . Glucose Blood (BAYER BREEZE 2 TEST VI)   In Vitro   by In Vitro route 4 (four) times daily.           . insulin lispro (HUMALOG) 100 UNIT/ML injection   Subcutaneous   Inject 10-20 Units into the skin every evening.           Marland Kitchen LANTUS 100 UNIT/ML injection      as directed.         . loratadine (CLARITIN) 10 MG tablet   Oral   Take 10 mg by mouth daily as needed.           Marland Kitchen losartan (COZAAR) 25 MG tablet   Oral   Take 25 mg by mouth daily.           Marland Kitchen  metoprolol (LOPRESSOR) 50 MG tablet   Oral   Take 50 mg by mouth 2 (two) times daily. Takes 2 tablets in the morning and 1 tablet in the evening.          . Misc Natural Products (OSTEO BI-FLEX TRIPLE STRENGTH PO)   Oral   Take by mouth as directed.           . Multiple Vitamins-Minerals (MULTIVITAMIN WITH MINERALS) tablet   Oral   Take 1 tablet by mouth daily.           . nitroGLYCERIN (NITROSTAT) 0.4 MG SL tablet   Sublingual   Place 0.4 mg under the tongue every 5 (five) minutes as needed.           Marland Kitchen POTASSIUM CHLORIDE PO   Oral   Take 595 mg by mouth daily.           . saw palmetto 500 MG capsule   Oral   Take 450 mg by mouth daily.           . simvastatin (ZOCOR) 40 MG tablet   Oral   Take 40 mg by mouth every evening.           . travoprost, benzalkonium, (TRAVATAN) 0.004 % ophthalmic solution   Both Eyes   Place 1 drop into both eyes at bedtime.             BP 119/60  Pulse 62  Temp(Src) 97.6 F (36.4 C)  Resp 20  SpO2 91%  Physical Exam  Nursing note and vitals reviewed.  79 year old male, resting comfortably and in no acute distress. Vital signs are normal. Oxygen saturation is 91%, which is hypoxic. Head is normocephalic and atraumatic. PERRLA, EOMI. Oropharynx  is clear. Neck is nontender and supple without adenopathy or JVD. There is erythema and induration in the right side of the neck in the supraclavicular area. There's no warmth or drainage noted. Back is nontender and there is no CVA tenderness. Lungs are clear without rales, wheezes, or rhonchi. Chest is nontender. Heart has regular rate and rhythm without murmur. Abdomen is soft, flat, nontender without masses or hepatosplenomegaly and peristalsis is normoactive. Extremities have no cyanosis or edema, full range of motion is present. Skin is warm and dry without rash. Neurologic: Mental status is normal, cranial nerves are intact, there are no motor or sensory deficits.  ED Course  Procedures (including critical care time)  Results for orders placed during the hospital encounter of 08/16/12  CBC WITH DIFFERENTIAL      Result Value Range   WBC 5.7  4.0 - 10.5 K/uL   RBC 4.33  4.22 - 5.81 MIL/uL   Hemoglobin 14.2  13.0 - 17.0 g/dL   HCT 65.7  84.6 - 96.2 %   MCV 95.6  78.0 - 100.0 fL   MCH 32.8  26.0 - 34.0 pg   MCHC 34.3  30.0 - 36.0 g/dL   RDW 95.2  84.1 - 32.4 %   Platelets 93 (*) 150 - 400 K/uL   Neutrophils Relative % 58  43 - 77 %   Neutro Abs 3.3  1.7 - 7.7 K/uL   Lymphocytes Relative 32  12 - 46 %   Lymphs Abs 1.8  0.7 - 4.0 K/uL   Monocytes Relative 7  3 - 12 %   Monocytes Absolute 0.4  0.1 - 1.0 K/uL   Eosinophils Relative 2  0 - 5 %   Eosinophils Absolute  0.1  0.0 - 0.7 K/uL   Basophils Relative 0  0 - 1 %   Basophils Absolute 0.0  0.0 - 0.1 K/uL  COMPREHENSIVE METABOLIC PANEL      Result Value Range   Sodium 136  135 - 145 mEq/L   Potassium 4.4  3.5 - 5.1 mEq/L   Chloride 96  96 - 112 mEq/L   CO2 31  19 - 32 mEq/L   Glucose, Bld 231 (*) 70 - 99 mg/dL   BUN 21  6 - 23 mg/dL   Creatinine, Ser 1.61  0.50 - 1.35 mg/dL   Calcium 9.5  8.4 - 09.6 mg/dL   Total Protein 7.6  6.0 - 8.3 g/dL   Albumin 4.0  3.5 - 5.2 g/dL   AST 23  0 - 37 U/L   ALT 18  0 - 53 U/L    Alkaline Phosphatase 98  39 - 117 U/L   Total Bilirubin 0.7  0.3 - 1.2 mg/dL   GFR calc non Af Amer 78 (*) >90 mL/min   GFR calc Af Amer >90  >90 mL/min    Date: 08/16/2012  Rate: 64  Rhythm: normal sinus rhythm  QRS Axis: left  Intervals: PR prolonged and QT prolonged  ST/T Wave abnormalities: normal  Conduction Disutrbances:nonspecific intraventricular conduction delay  Narrative Interpretation: Prolonged QT interval, first degree AV block, nonspecific intraventricular conduction delay, left axis deviation. When compared with ECG of 01/05/2009, QRS no longer has appearance of typical left bundle branch block, left axis deviation is now present.  Old EKG Reviewed: changes noted    1. Infection of pacemaker lead wire, initial encounter   2. Thrombocytopenia       MDM  Infected pacemaker lead. No records have been reviewed and he had an office visit 2 days ago where decision was made to replace the infected wire. Apparently, he has presented today to have that procedure done. He'll be given dose of vancomycin. Representatives of his cardiologist are here to arrange admission.  Dione Booze, MD 08/16/12 [phone]

## 2012-08-16 NOTE — ED Notes (Signed)
2L [place] applied after ABG draw, SpO2 93%

## 2012-08-16 NOTE — ED Notes (Signed)
There was a change of shift, informed oncoming respiratory tech to draw ABG and the respiratory tech stated this patient's ABG will be drawn next.

## 2012-08-16 NOTE — Telephone Encounter (Signed)
New Problem:    Patient's wife called in because his antibiotics are making him really sick and constipated and his wound site is discharging a lot of fluid and would like to know how to proceed.  Please call back.

## 2012-08-16 NOTE — ED Notes (Signed)
The pt has had swelling  Around his pacemaker on the rt side of his chest for one month.  The area has been drained but the pain and infection continues.  He was sent here  For admission to have the pacemaker removed and the area drained well.

## 2012-08-16 NOTE — ED Notes (Signed)
Respiratory at bedside for ABG draw. 

## 2012-08-16 NOTE — Progress Notes (Signed)
ANTIBIOTIC CONSULT NOTE - INITIAL  Pharmacy Consult for vancomycin Indication: ICD lead infection  Allergies  Allergen Reactions  . Clindamycin/Lincomycin Nausea And Vomiting    Patient Measurements:    Vital Signs: Temp: 97.6 F (36.4 C) (05/30 1549) BP: 119/60 mmHg (05/30 1549) Pulse Rate: 62 (05/30 1549) Intake/Output from previous day:   Intake/Output from this shift:    Labs:  Recent Labs  08/16/12 1558  WBC 5.7  HGB 14.2  PLT 93*  CREATININE 0.93   The CrCl is unknown because both a height and weight (above a minimum accepted value) are required for this calculation. No results found for this basename: VANCOTROUGH, VANCOPEAK, VANCORANDOM, GENTTROUGH, GENTPEAK, GENTRANDOM, TOBRATROUGH, TOBRAPEAK, TOBRARND, AMIKACINPEAK, AMIKACINTROU, AMIKACIN,  in the last 72 hours   Microbiology: No results found for this or any previous visit (from the past 720 hour(s)).  Medical History: Past Medical History  Diagnosis Date  . Cardiomyopathy     broken 3x's  . History of fracture of nose   . Diabetes mellitus     type 2  . Hyperlipidemia   . Arthritis    Assessment: 37 yom with history of NICM, heart block and HF with PM insertaion presented to the ED for evaluation of a poorly hearling, draining R neck wound. He has been treated with doxycycline without improvement and also clindamycin but the patient was unable to tolerate this. To start empiric vancomycin for possible infected ICD lead. Pt is afebrile and WBC is WNL. Pt has adequate renal function. Pt has already received 1gm vanc in the ED.   Goal of Therapy:  Vancomycin trough level 10-15 mcg/ml  Plan:  1. Vanc 1250mg  IV Q12H 2. F/u renal function, C&S, clinical status and trough at [facility], Drake Leach 08/16/2012,6:01 PM

## 2012-08-16 NOTE — ED Notes (Signed)
Respiratory tech at bedside, stated she would be in soon to draw ABG.

## 2012-08-16 NOTE — H&P (Signed)
Louis Acosta  Patient ID: Louis Acosta MRN: [id number], DOB/AGE: 1932-01-23   Admit date: 08/16/2012 Date of Admission: 08/16/2012  Primary Physician: Marden Noble, MD Admitting Physician: Lewayne Bunting, MD Reason for Consultation: BiV PPM lead infection  History of Present Illness Louis Acosta is a 79 year old man with a long-standing NICM, complete heart block and chronic systolic heart failure status post bi-ventricular pacemaker insertion. His heart failure symptoms remain class II. He has noted chest pain with exertion. He takes nitroglycerin as needed for control. He has been followed by Dr. Ladona Ridgel.   He presents to the ED with his wife for evaluation of a poorly healing, draining wound. This wound on the lower right neck has been present since mid April. He has been treated with doxycycline. He was seen in follow-up by Dr. Ladona Ridgel on 08/13/2012 at which time it was decided he would undergo lead extraction. This was scheduled for this Wednesday, 08/21/2012. In the interim, he was given a prescription for clindamycin; however, he was unable to tolerate this medication due to nausea.   This AM his wife reports it was draining pus. He also states it is quite tender to touch. He denies fever or chills. Otherwise he has no complaints. He denies CP. He has chronic DOE but he denies worsening. He denies palpitations, dizziness, near syncope or syncope. He denies LE swelling, orthopnea or PND.   Past Medical History Past Medical History  Diagnosis Date  . Cardiomyopathy     broken 3x's  . History of fracture of nose   . Diabetes mellitus     type 2  . Hyperlipidemia   . Arthritis     Past Surgical History Past Surgical History  Procedure Laterality Date  . Transthoracic echocardiogram  01/2007    Allergies/Intolerances Allergies  Allergen Reactions  . Clindamycin/Lincomycin Nausea And Vomiting   Current Home Medications   Medication List    ASK  your doctor about these medications       BAYER BREEZE 2 TEST VI  by In Vitro route 4 (four) times daily.     clindamycin 300 MG capsule  Commonly known as:  CLEOCIN  Take 300 mg by mouth 3 (three) times daily. For 5 days then stop; Start date 08/13/12     dorzolamide 2 % ophthalmic solution  Commonly known as:  TRUSOPT  Place 1 drop into the right eye 2 (two) times daily.     [facility] [facility] UNILET LANCETS 30G Misc  by Does not apply route.     fish oil-omega-3 fatty acids 1000 MG capsule  Take 1 g by mouth daily.     fluticasone 50 MCG/ACT nasal spray  Commonly known as:  FLONASE  Place 1 spray into the nose daily as needed for allergies.     furosemide 40 MG tablet  Commonly known as:  LASIX  Take 1 tablet (40 mg total) by mouth 2 (two) times daily.     insulin lispro 100 UNIT/ML injection  Commonly known as:  HUMALOG  Inject 10-20 Units into the skin daily as needed for high blood sugar.     LANTUS 100 UNIT/ML injection  Generic drug:  insulin glargine  Inject 20-70 Units into the skin at bedtime.     loratadine 10 MG tablet  Commonly known as:  CLARITIN  Take 10 mg by mouth daily as needed for allergies.     losartan 25 MG tablet  Commonly known as:  COZAAR  Take 25 mg by mouth daily.     metoprolol 50 MG tablet  Commonly known as:  LOPRESSOR  Take 50-100 mg by mouth 2 (two) times daily. Takes 2 tablets in the morning and 1 tablet in the evening.     multivitamin with minerals tablet  Take 1 tablet by mouth daily.     nitroGLYCERIN 0.4 MG SL tablet  Commonly known as:  NITROSTAT  Place 0.4 mg under the tongue every 5 (five) minutes as needed for chest pain.     OSTEO BI-FLEX TRIPLE STRENGTH PO  Take 1 tablet by mouth daily.     potassium gluconate 595 MG Tabs  Take 595 mg by mouth daily.     Saw Palmetto 450 MG Caps  Take 450 mg by mouth 2 (two) times daily.     simvastatin 40 MG tablet  Commonly known as:  ZOCOR  Take 40 mg by mouth every evening.      travoprost (benzalkonium) 0.004 % ophthalmic solution  Commonly known as:  TRAVATAN  Place 1 drop into both eyes at bedtime.     VITAMIN-B COMPLEX PO  Take 1 tablet by mouth daily.       Family History Positive for CAD   Social History Social History  . Marital Status: Married   Occupational History  . retired    Social History Main Topics  . Smoking status: Never Smoker   . Smokeless tobacco: No  . Alcohol Use: No  . Drug Use: No   Review of Systems General: No chills, fever, night sweats or weight changes  Cardiovascular:  No chest pain, worsening DOE, edema, orthopnea, palpitations, paroxysmal nocturnal dyspnea Dermatological: No rash, lesions or masses Respiratory: +cough  Urologic: No hematuria, dysuria Abdominal: No nausea, vomiting, diarrhea, bright red blood per rectum, melena, or hematemesis Neurologic: No visual changes, weakness, changes in mental status All other systems reviewed and are otherwise negative except as noted above.  Acosta Exam Blood pressure 119/60, pulse 62, temperature 97.6 F (36.4 C), resp. rate 20, SpO2 91.00%.  General: Well developed, well appearing 79 year old male in no acute distress. HEENT: Normocephalic, atraumatic. EOMs intact. Sclera nonicteric. Oropharynx clear.  Neck: Supple. No JVD. Lungs: Respirations regular and unlabored, CTA bilaterally. No wheezes, rales or rhonchi. Heart: RRR. S1, S2 present. No murmurs, rub, S3 or S4. Abdomen: Soft, non-tender, non-distended. BS present x 4 quadrants. No hepatosplenomegaly.  Extremities: No clubbing, cyanosis or edema. DP/PT/Radials 2+ and equal bilaterally. Psych: Normal affect. Neuro: Alert and oriented X 3. Moves all extremities spontaneously. Musculoskeletal: No kyphosis. Skin: Right lower neck is erythematous and indurated, tender to palpation over area of visible lead curvature in the right IJ. Small area of eshcar inferiorly. No active drainage or bleeding.   Labs Lab  Results  Component Value Date   WBC 5.7 08/16/2012   HGB 14.2 08/16/2012   HCT 41.4 08/16/2012   MCV 95.6 08/16/2012   PLT 93* 08/16/2012    Recent Labs Lab 08/16/12 1558  NA 136  K 4.4  CL 96  CO2 31  BUN 21  CREATININE 0.93  CALCIUM 9.5  PROT 7.6  BILITOT 0.7  ALKPHOS 98  ALT 18  AST 23  GLUCOSE 231*    Radiology/Studies CXR pending  Assessment and Plan 1. BiV PPM lead infection  2. CHB s/p BiV PPM implant  3. NICM with chronic systolic HF   Mr. Guess presents with an infected LV lead in the right IJ. Initially, it  was felt to be a superficial skin infection; however, he has completed a course of doxycycline without improvement. In fact, the site remains erythematous, tender, now indurated with persistent drainage and eschar. Dr. Ladona Ridgel has reviewed the recent CXR and device interrogation. Mr. Tomberlin is pacemaker dependent. Two possible treatment options were reviewed with him and his wife. Option #1 - Enter the pocket on the left and extract the LV lead, then close the pocket and continue antibiotics for several weeks. Once the right neck is healed, reimplant a new LV lead, possibly using the left IJ. Option #2 - Insert temp-perm pacemaker via left IJ and extract entire BiV PPM system, treat with antibiotics then reimplant new BiV PPM system after several weeks on the right. After detailed discussion regarding options, Mr. Leamer has elected to proceed with option #1. The risks and benefits were reviewed in detail. These risks include but are not limited to death, stroke, MI, bleeding, infection, perforation, tamponade, lead dislodgement, vascular damage or renal failure. Mr. Lacher and his wife expressed verbal understanding and agree to proceed. Mr. Nickles will be admitted to telemetry. Continue home medications. Start IV vancomycin per Pharmacy. Further recommendations pending his clinical course.  Dr. Ladona Ridgel to see Signed, Rick Duff, PA-C 08/16/2012, 4:57 PM  EP  Attending  Patient seen and examined. Agree with above history, Acosta exam, assessment and plan. Only unexpected finding is hypoxia with oxygen desaturation on pulse oximetry. Will treat with IV lasix. If he tunes up quickly, he could be discharged in 24-36 hours (discharging on Sunday morning) and return for extraction on Wednesday. If not, will keep until next week and attempt to tune up before lead extraction.  Leonia Reeves.D.

## 2012-08-16 NOTE — Telephone Encounter (Signed)
Only option at this point is to go to the ER

## 2012-08-16 NOTE — ED Notes (Signed)
MD Preston Fleeting and PA Edmisten at bedside.

## 2012-08-16 NOTE — ED Notes (Signed)
The pt is taking antibiotics and they are making him nauseated

## 2012-08-16 NOTE — Progress Notes (Signed)
Arterial gas perform while patient on room air.Sp02=81% Placed patient on 2lpm with Sp02 increasing to 93%

## 2012-08-16 NOTE — Progress Notes (Signed)
Notified Orpah Clinton RN on panic CO2 level

## 2012-08-16 NOTE — ED Notes (Signed)
This nurse spoke with Dr. Ladona Ridgel and he told me to take the nasal cannula off this patient and to draw an ABG in 30 minutes.

## 2012-08-16 NOTE — Telephone Encounter (Signed)
Called patient's wife and advised her to bring him to the ER at [facility]. [employer] ( [facility]) and [facility].

## 2012-08-17 DIAGNOSIS — I509 Heart failure, unspecified: Secondary | ICD-10-CM

## 2012-08-17 DIAGNOSIS — I5023 Acute on chronic systolic (congestive) heart failure: Secondary | ICD-10-CM

## 2012-08-17 LAB — CBC
MCV: 96.8 fL (ref 78.0–100.0)
Platelets: 85 10*3/uL — ABNORMAL LOW (ref 150–400)
Platelets: 77 10*3/uL — ABNORMAL LOW (ref 150–400)
RBC: 4.12 MIL/uL — ABNORMAL LOW (ref 4.22–5.81)
RDW: 13 % (ref 11.5–15.5)
RDW: 12.9 % (ref 11.5–15.5)
WBC: 6.4 10*3/uL (ref 4.0–10.5)
WBC: 6.4 10*3/uL (ref 4.0–10.5)

## 2012-08-17 LAB — GLUCOSE, CAPILLARY
Glucose-Capillary: 153 mg/dL — ABNORMAL HIGH (ref 70–99)
Glucose-Capillary: 189 mg/dL — ABNORMAL HIGH (ref 70–99)
Glucose-Capillary: 185 mg/dL — ABNORMAL HIGH (ref 70–99)

## 2012-08-17 LAB — BASIC METABOLIC PANEL
Chloride: 98 mEq/L (ref 96–112)
Creatinine, Ser: 0.98 mg/dL (ref 0.50–1.35)
GFR calc Af Amer: 88 mL/min — ABNORMAL LOW (ref >90)
Sodium: 139 mEq/L (ref 135–145)

## 2012-08-17 LAB — PRO B NATRIURETIC PEPTIDE: Pro B Natriuretic peptide (BNP): 1736 pg/mL — ABNORMAL HIGH (ref 0–450)

## 2012-08-17 MED ORDER — POTASSIUM CHLORIDE CRYS ER 20 MEQ PO TBCR
40.0000 meq | EXTENDED_RELEASE_TABLET | Freq: Every day | ORAL | Status: DC
  Administered 2012-08-17 – 2012-08-18 (×2): 40 meq via ORAL
  Filled 2012-08-17 (×3): qty 2

## 2012-08-17 MED ORDER — METOPROLOL TARTRATE 50 MG PO TABS
50.0000 mg | ORAL_TABLET | Freq: Once | ORAL | Status: DC
  Filled 2012-08-17: qty 1

## 2012-08-17 MED ORDER — METOPROLOL SUCCINATE ER 100 MG PO TB24
100.0000 mg | ORAL_TABLET | Freq: Every day | ORAL | Status: DC
  Filled 2012-08-17: qty 1

## 2012-08-17 MED ORDER — SODIUM CHLORIDE 0.9 % IV SOLN: INTRAVENOUS | Status: DC

## 2012-08-17 MED ORDER — FUROSEMIDE 10 MG/ML IJ SOLN
40.0000 mg | Freq: Three times a day (TID) | INTRAMUSCULAR | Status: DC
  Administered 2012-08-17 – 2012-08-19 (×6): 40 mg via INTRAVENOUS
  Filled 2012-08-17 (×9): qty 4

## 2012-08-17 MED ORDER — VANCOMYCIN HCL IN DEXTROSE 1-5 GM/200ML-% IV SOLN
1000.0000 mg | Freq: Two times a day (BID) | INTRAVENOUS | Status: DC
  Administered 2012-08-17 – 2012-08-20 (×6): 1000 mg via INTRAVENOUS
  Filled 2012-08-17 (×9): qty 200

## 2012-08-17 MED ORDER — METOPROLOL SUCCINATE ER 100 MG PO TB24
100.0000 mg | ORAL_TABLET | Freq: Every day | ORAL | Status: DC
  Administered 2012-08-17 – 2012-08-22 (×6): 100 mg via ORAL
  Filled 2012-08-17 (×7): qty 1

## 2012-08-17 NOTE — Progress Notes (Signed)
Report from Night RN. Chart reviewed together. Handoff complete.Introductions complete. Will continue to monitor and advise attending as needed.   

## 2012-08-17 NOTE — Progress Notes (Signed)
ANTIBIOTIC CONSULT NOTE   Pharmacy Consult for vancomycin Indication: ICD lead infection  Allergies  Allergen Reactions  . Clindamycin/Lincomycin Nausea And Vomiting    Patient Measurements: Height: 5' 10.87" (180 cm) Weight: 218 lb 4.1 oz (99 kg) IBW/kg (Calculated) : 74.99  Vital Signs: Temp: 98 F (36.7 C) (05/31 0735) Temp src: Oral (05/31 0735) BP: 118/57 mmHg (05/31 0200) Pulse Rate: 64 (05/31 0200) Intake/Output from previous day: 05/30 0701 - 05/31 0700 In: -  Out: 800 [Urine:800] Intake/Output from this shift:    Labs:  Recent Labs  08/16/12 1558 08/17/12 0454  WBC 5.7 6.4  HGB 14.2 13.7  PLT 93* 85*  CREATININE 0.93 0.98   Estimated Creatinine Clearance: 73.1 ml/min (by C-G formula based on Cr of 0.98). No results found for this basename: VANCOTROUGH, Leodis Binet, VANCORANDOM, GENTTROUGH, GENTPEAK, GENTRANDOM, TOBRATROUGH, TOBRAPEAK, TOBRARND, AMIKACINPEAK, AMIKACINTROU, AMIKACIN,  in the last 72 hours   Microbiology: Recent Results (from the past 720 hour(s))  MRSA PCR SCREENING     Status: None   Collection Time    08/16/12  9:58 PM      Result Value Range Status   MRSA by PCR NEGATIVE  NEGATIVE Final   Comment:            The GeneXpert MRSA Assay (FDA     approved for NASAL specimens     only), is one component of a     comprehensive MRSA colonization     surveillance program. It is not     intended to diagnose MRSA     infection nor to guide or     monitor treatment for     MRSA infections.    Medical History: Past Medical History  Diagnosis Date  . Cardiomyopathy     broken 3x's  . History of fracture of nose   . Diabetes mellitus     type 2  . Hyperlipidemia   . Arthritis    Assessment: 28 yom with history of NICM, heart block and HF with PM insertaion presented to the ED for evaluation of a poorly hearling, draining R neck wound. He has been treated with doxycycline without improvement and also clindamycin but the patient was  unable to tolerate this. To start empiric vancomycin for possible infected ICD lead. Pt is afebrile and WBC is WNL. Pt has adequate renal function. Pt has already received 1gm vanc in the ED.   Goal of Therapy:  Vancomycin trough level 10-15 mcg/ml  Plan:  1. Decrease Vanc to 1000mg  IV Q12H 2. F/u renal function, C&S, clinical status and trough at [facility], Corynne Scibilia 08/17/2012,8:38 AM

## 2012-08-17 NOTE — Progress Notes (Addendum)
Patient ID: Louis Acosta, male   DOB: 23-Aug-1931, 79 y.o.   MRN: [id number]    SUBJECTIVE: Some dyspnea with ambulation.  On 4 L oxygen by nasal cannula with O2 sats 95%.  No fever.   . B-complex with vitamin C  1 tablet Oral Daily  . dorzolamide  1 drop Right Eye BID  . fish oil-omega-3 fatty acids  1 g Oral Daily  . furosemide  40 mg Intravenous Q8H  . heparin  5,000 Units Subcutaneous Q8H  . insulin aspart  0-9 Units Subcutaneous TID WC  . insulin glargine  50 Units Subcutaneous QHS  . losartan  25 mg Oral Daily  . metoprolol tartrate  100 mg Oral Daily   And  . metoprolol tartrate  50 mg Oral QHS  . multivitamin with minerals  1 tablet Oral Daily  . potassium chloride  40 mEq Oral Daily  . simvastatin  40 mg Oral q1800  . sodium chloride  3 mL Intravenous Q12H  . Travoprost (BAK Free)  1 drop Both Eyes QHS  . vancomycin  1,000 mg Intravenous Q12H      Filed Vitals:   08/17/12 0257 08/17/12 0400 08/17/12 0453 08/17/12 0735  BP:      Pulse:      Temp: 97.6 F (36.4 C) 97.6 F (36.4 C)  98 F (36.7 C)  TempSrc: Oral Oral  Oral  Resp:      Height:    5' 10.87" (1.8 m)  Weight:   218 lb 4.1 oz (99 kg)   SpO2:        Intake/Output Summary (Last 24 hours) at 08/17/12 1040 Last data filed at 08/17/12 0200  Gross per 24 hour  Intake      0 ml  Output    800 ml  Net   -800 ml    LABS: Basic Metabolic Panel:  Recent Labs  40/98/11 1558 08/17/12 0454  NA 136 139  K 4.4 4.3  CL 96 98  CO2 31 34*  GLUCOSE 231* 144*  BUN 21 21  CREATININE 0.93 0.98  CALCIUM 9.5 9.3   Liver Function Tests:  Recent Labs  08/16/12 1558  AST 23  ALT 18  ALKPHOS 98  BILITOT 0.7  PROT 7.6  ALBUMIN 4.0   No results found for this basename: LIPASE, AMYLASE,  in the last 72 hours CBC:  Recent Labs  08/16/12 1558 08/17/12 0454  WBC 5.7 6.4  NEUTROABS 3.3  --   HGB 14.2 13.7  HCT 41.4 39.9  MCV 95.6 96.8  PLT 93* 85*   Cardiac Enzymes: No results found for this  basename: CKTOTAL, CKMB, CKMBINDEX, TROPONINI,  in the last 72 hours BNP: No components found with this basename: POCBNP,  D-Dimer: No results found for this basename: DDIMER,  in the last 72 hours Hemoglobin A1C: No results found for this basename: HGBA1C,  in the last 72 hours Fasting Lipid Panel: No results found for this basename: CHOL, HDL, LDLCALC, TRIG, CHOLHDL, LDLDIRECT,  in the last 72 hours Thyroid Function Tests: No results found for this basename: TSH, T4TOTAL, FREET3, T3FREE, THYROIDAB,  in the last 72 hours Anemia Panel: No results found for this basename: VITAMINB12, FOLATE, FERRITIN, TIBC, IRON, RETICCTPCT,  in the last 72 hours  RADIOLOGY: Dg Chest 2 View  08/16/2012   *RADIOLOGY REPORT*  Clinical Data: Shortness of breath, right neck infection, preop  CHEST - 2 VIEW  Comparison: 06/20/2012  Findings: Lungs are essentially clear.  No focal consolidation. No pleural effusion or pneumothorax.  Cardiomegaly.  Left subclavian ICD.  One of the leads/wires is looped in the right supraclavicular region, unchanged since 2010.  IMPRESSION: No evidence of acute cardiopulmonary disease.   Original Report Authenticated By: Charline Bills, M.D.    PHYSICAL EXAM General: NAD Neck: Thick, JVP 10 cm, no thyromegaly or thyroid nodule.  Lungs: Decreased breath sounds bilaterally with prolonged expiratory phase.   CV: Nondisplaced PMI.  Heart regular S1/S2, no S3/S4, no murmur.  No peripheral edema.   Abdomen: Soft, nontender, no hepatosplenomegaly, no distention.  Neurologic: Alert and oriented x 3.  Psych: Normal affect. Extremities: No clubbing or cyanosis.  Skin: right IJ area with small area of drainage/erythema.   TELEMETRY: Reviewed telemetry pt in NSR with BiV pacing  ASSESSMENT AND PLAN: 79 yo with nonischemic cardiomyopathy and biventricular ICD presents with infected right IJ LV lead.  He also has been noted to be hypoxic with probable acute on chronic systolic CHF.  1.  Infected right IJ LV lead: Continue vancomycin IV.  No fever, WBCs not elevated.  Plan for lead extraction next week.  2. Acute on chronic systolic CHF: JVP elevated and hypoxic (95% sats on 4 L [place] today).   - Lasix 40 mg IV every 8 hours - Continue losartan - Will transition off metoprolol tartrate and onto Toprol XL 100 mg daily tomorrow morning.  - Echo (no echo since 2008).  3. Hypoxemia: There is certainly a component of volume overload.  However, lungs also sound like COPD and I wonder if there is a component of OSA as well.   Marca Ancona 08/17/2012 10:45 AM

## 2012-08-18 LAB — GLUCOSE, CAPILLARY: Glucose-Capillary: 170 mg/dL — ABNORMAL HIGH (ref 70–99)

## 2012-08-18 LAB — CBC
MCH: 32.9 pg (ref 26.0–34.0)
Platelets: 86 10*3/uL — ABNORMAL LOW (ref 150–400)
RBC: 4.19 MIL/uL — ABNORMAL LOW (ref 4.22–5.81)
WBC: 7 10*3/uL (ref 4.0–10.5)

## 2012-08-18 LAB — BASIC METABOLIC PANEL
Calcium: 9.6 mg/dL (ref 8.4–10.5)
GFR calc non Af Amer: 65 mL/min — ABNORMAL LOW (ref >90)
Glucose, Bld: 166 mg/dL — ABNORMAL HIGH (ref 70–99)
Sodium: 134 mEq/L — ABNORMAL LOW (ref 135–145)

## 2012-08-18 NOTE — Progress Notes (Signed)
Patient ID: Louis Acosta, male   DOB: 06-30-1931, 79 y.o.   MRN: [id number]    SUBJECTIVE: Diuresed well yesterday.  Remains on oxygen.  No dyspnea at rest. No drainage from right IJ site since he has been in the hospital.    . B-complex with vitamin C  1 tablet Oral Daily  . dorzolamide  1 drop Right Eye BID  . fish oil-omega-3 fatty acids  1 g Oral Daily  . furosemide  40 mg Intravenous Q8H  . insulin aspart  0-9 Units Subcutaneous TID WC  . insulin glargine  50 Units Subcutaneous QHS  . losartan  25 mg Oral Daily  . metoprolol succinate  100 mg Oral Daily  . multivitamin with minerals  1 tablet Oral Daily  . potassium chloride  40 mEq Oral Daily  . simvastatin  40 mg Oral q1800  . sodium chloride  3 mL Intravenous Q12H  . Travoprost (BAK Free)  1 drop Both Eyes QHS  . vancomycin  1,000 mg Intravenous Q12H      Filed Vitals:   08/18/12 0400 08/18/12 0515 08/18/12 0600 08/18/12 0731  BP: 122/64  80/63 118/51  Pulse: 73 65 75 69  Temp: 98.6 F (37 C)   98.6 F (37 C)  TempSrc: Oral   Oral  Resp: 20 21 20 21   Height:      Weight:      SpO2: 91% 96% 98% 94%    Intake/Output Summary (Last 24 hours) at 08/18/12 0946 Last data filed at 08/18/12 0900  Gross per 24 hour  Intake 963.67 ml  Output   3400 ml  Net -2436.33 ml    LABS: Basic Metabolic Panel:  Recent Labs  40/98/11 0454 08/18/12 0421  NA 139 134*  K 4.3 4.4  CL 98 94*  CO2 34* 36*  GLUCOSE 144* 166*  BUN 21 27*  CREATININE 0.98 1.05  CALCIUM 9.3 9.6   Liver Function Tests:  Recent Labs  08/16/12 1558  AST 23  ALT 18  ALKPHOS 98  BILITOT 0.7  PROT 7.6  ALBUMIN 4.0   No results found for this basename: LIPASE, AMYLASE,  in the last 72 hours CBC:  Recent Labs  08/16/12 1558  08/17/12 1120 08/18/12 0421  WBC 5.7  < > 6.4 7.0  NEUTROABS 3.3  --   --   --   HGB 14.2  < > 13.8 13.8  HCT 41.4  < > 40.2 40.7  MCV 95.6  < > 97.3 97.1  PLT 93*  < > 77* 86*  < > = values in this  interval not displayed. Cardiac Enzymes: No results found for this basename: CKTOTAL, CKMB, CKMBINDEX, TROPONINI,  in the last 72 hours BNP: No components found with this basename: POCBNP,  D-Dimer: No results found for this basename: DDIMER,  in the last 72 hours Hemoglobin A1C: No results found for this basename: HGBA1C,  in the last 72 hours Fasting Lipid Panel: No results found for this basename: CHOL, HDL, LDLCALC, TRIG, CHOLHDL, LDLDIRECT,  in the last 72 hours Thyroid Function Tests: No results found for this basename: TSH, T4TOTAL, FREET3, T3FREE, THYROIDAB,  in the last 72 hours Anemia Panel: No results found for this basename: VITAMINB12, FOLATE, FERRITIN, TIBC, IRON, RETICCTPCT,  in the last 72 hours  RADIOLOGY: Dg Chest 2 View  08/16/2012   *RADIOLOGY REPORT*  Clinical Data: Shortness of breath, right neck infection, preop  CHEST - 2 VIEW  Comparison: 06/20/2012  Findings:  Lungs are essentially clear.  No focal consolidation. No pleural effusion or pneumothorax.  Cardiomegaly.  Left subclavian ICD.  One of the leads/wires is looped in the right supraclavicular region, unchanged since 2010.  IMPRESSION: No evidence of acute cardiopulmonary disease.   Original Report Authenticated By: Charline Bills, M.D.    PHYSICAL EXAM General: NAD Neck: Thick, JVP 10 cm, no thyromegaly or thyroid nodule.  Lungs: Decreased breath sounds bilaterally with prolonged expiratory phase.   CV: Nondisplaced PMI.  Heart regular S1/S2, no S3/S4, no murmur.  No peripheral edema.   Abdomen: Soft, nontender, no hepatosplenomegaly, no distention.  Neurologic: Alert and oriented x 3.  Psych: Normal affect. Extremities: No clubbing or cyanosis.  Skin: right IJ area with small area of drainage/erythema.   TELEMETRY: Reviewed telemetry pt in NSR with BiV pacing  ASSESSMENT AND PLAN: 79 yo with nonischemic cardiomyopathy and biventricular ICD presents with infected right IJ LV lead.  He also has been  noted to be hypoxic with acute on chronic systolic CHF.  1. Infected right IJ LV lead: Continue vancomycin IV.  No fever, WBCs not elevated.  Plan for lead extraction next week.  No drainage from site.  2. Acute on chronic systolic CHF: JVP elevated and hypoxic.  I think he is volume overloaded but also suspect he has OSA, maybe OHS/OSA.  He quit smoking almost 50 years ago so think it is unlikely that COPD plays a role.   - Continue current IV Lasix, would like to see another day of good diuresis.  - Continue losartan and Toprol XL.  - Echo (no echo since 2008).  3. Transfer to telemetry.  Will be seen by Dr. Ladona Ridgel tomorrow to decide on timing of lead removal.   Marca Ancona 08/18/2012 9:46 AM

## 2012-08-19 DIAGNOSIS — I517 Cardiomegaly: Secondary | ICD-10-CM

## 2012-08-19 LAB — GLUCOSE, CAPILLARY
Glucose-Capillary: 200 mg/dL — ABNORMAL HIGH (ref 70–99)
Glucose-Capillary: 135 mg/dL — ABNORMAL HIGH (ref 70–99)

## 2012-08-19 LAB — BASIC METABOLIC PANEL
CO2: 38 mEq/L — ABNORMAL HIGH (ref 19–32)
Calcium: 10 mg/dL (ref 8.4–10.5)
Glucose, Bld: 132 mg/dL — ABNORMAL HIGH (ref 70–99)
Sodium: 133 mEq/L — ABNORMAL LOW (ref 135–145)

## 2012-08-19 MED ORDER — PERFLUTREN LIPID MICROSPHERE
1.0000 mL | INTRAVENOUS | Status: AC | PRN
  Administered 2012-08-19: 3 mL via INTRAVENOUS
  Filled 2012-08-19: qty 10

## 2012-08-19 MED ORDER — POTASSIUM CHLORIDE CRYS ER 20 MEQ PO TBCR
40.0000 meq | EXTENDED_RELEASE_TABLET | ORAL | Status: AC
  Administered 2012-08-19 (×3): 40 meq via ORAL
  Filled 2012-08-19 (×2): qty 2

## 2012-08-19 MED ORDER — PERFLUTREN LIPID MICROSPHERE
INTRAVENOUS | Status: AC
  Filled 2012-08-19: qty 10

## 2012-08-19 MED ORDER — FUROSEMIDE 80 MG PO TABS
80.0000 mg | ORAL_TABLET | Freq: Two times a day (BID) | ORAL | Status: DC
  Administered 2012-08-19 – 2012-08-22 (×6): 80 mg via ORAL
  Filled 2012-08-19 (×8): qty 1

## 2012-08-19 NOTE — Progress Notes (Signed)
Patient: Louis Acosta Date of Encounter: 08/19/2012, 7:18 AM Admit date: 08/16/2012     Subjective  Louis Acosta reports he is tired and not getting any sleep here; otherwise, he denies any complaints this AM. He denies CP, SOB or palpitations. He would like to go home and return on Wednesday for procedure with Dr. Ladona Ridgel as scheduled.    Objective  Physical Exam: Vitals: BP 130/59  Pulse 77  Temp(Src) 98.4 F (36.9 C) (Oral)  Resp 22  Ht 5\' 11"  (1.803 m)  Wt 215 lb 2.7 oz (97.6 kg)  BMI 30.02 kg/m2  SpO2 93% General: Well developed, well appearing 79 year old male in no acute distress. Neck: Supple. JVD not elevated. Lungs: Clear bilaterally to auscultation without wheezes, rales, or rhonchi. Breathing is unlabored. Heart: RRR S1 S2 without murmurs, rubs, or gallops.  Abdomen: Soft, non-distended. Extremities: No clubbing or cyanosis. No edema.  Distal pedal pulses are 2+ and equal bilaterally. Neuro: Alert and oriented X 3. Moves all extremities spontaneously. No focal deficits.  Intake/Output:  Intake/Output Summary (Last 24 hours) at 08/19/12 0718 Last data filed at 08/19/12 1610  Gross per 24 hour  Intake    600 ml  Output   1200 ml  Net   -600 ml    Inpatient Medications:  . B-complex with vitamin C  1 tablet Oral Daily  . dorzolamide  1 drop Right Eye BID  . fish oil-omega-3 fatty acids  1 g Oral Daily  . furosemide  40 mg Intravenous Q8H  . insulin aspart  0-9 Units Subcutaneous TID WC  . insulin glargine  50 Units Subcutaneous QHS  . losartan  25 mg Oral Daily  . metoprolol succinate  100 mg Oral Daily  . multivitamin with minerals  1 tablet Oral Daily  . potassium chloride  40 mEq Oral Daily  . simvastatin  40 mg Oral q1800  . sodium chloride  3 mL Intravenous Q12H  . Travoprost (BAK Free)  1 drop Both Eyes QHS  . vancomycin  1,000 mg Intravenous Q12H   . sodium chloride 10 mL/hr at 08/17/12 2000    Labs:  Recent Labs  08/18/12 0421  08/19/12 0430  NA 134* 133*  K 4.4 3.7  CL 94* 90*  CO2 36* 38*  GLUCOSE 166* 132*  BUN 27* 30*  CREATININE 1.05 1.02  CALCIUM 9.6 10.0    Recent Labs  08/16/12 1558  AST 23  ALT 18  ALKPHOS 98  BILITOT 0.7  PROT 7.6  ALBUMIN 4.0    Recent Labs  08/16/12 1558  08/17/12 1120 08/18/12 0421  WBC 5.7  < > 6.4 7.0  NEUTROABS 3.3  --   --   --   HGB 14.2  < > 13.8 13.8  HCT 41.4  < > 40.2 40.7  MCV 95.6  < > 97.3 97.1  PLT 93*  < > 77* 86*  < > = values in this interval not displayed.  ProBNP 1736   Radiology/Studies: Dg Chest 2 View  08/16/2012   *RADIOLOGY REPORT*  Clinical Data: Shortness of breath, right neck infection, preop  CHEST - 2 VIEW  Comparison: 06/20/2012  Findings: Lungs are essentially clear.  No focal consolidation. No pleural effusion or pneumothorax.  Cardiomegaly.  Left subclavian ICD.  One of the leads/wires is looped in the right supraclavicular region, unchanged since 2010.  IMPRESSION: No evidence of acute cardiopulmonary disease.   Original Report Authenticated By: Charline Bills, M.D.  Telemetry: predominantly P synchronous V pacing; no arrhythmias    Assessment and Plan  1. BiV PPM lead infection 2. Hypoxia 3. NICM with chronic systolic HF, BiV PPM in place 4. Thrombocytopenia 5.  Contraction alkalosis  Dr. Graciela Husbands to see Signed, EDMISTEN, BROOKE PA-C Still on O2    Ambulatory O2 still falling Echo pending for LVEF Will increase chloride repletion Continue IV lasix Anticipate revision wednesday

## 2012-08-19 NOTE — Progress Notes (Addendum)
SATURATION QUALIFICATIONS: (This note is used to comply with regulatory documentation for home oxygen)  Patient Saturations on Room Air at Rest = 92%  Patient Saturations on Room Air while Ambulating = 86%   Please briefly explain why patient needs home oxygen:  Pt stat when ambulating 85-88% ambulate 372ft. Pt was not SOB and stated they felt "fine"

## 2012-08-19 NOTE — Progress Notes (Signed)
Utilization Review Completed.   Deshannon Hinchliffe, RN, BSN Nurse Case Manager  [phone]  

## 2012-08-19 NOTE — Progress Notes (Addendum)
  Echocardiogram 2D Echocardiogram with Definity has been performed.  Cathie Beams 08/19/2012, 11:37 AM

## 2012-08-20 ENCOUNTER — Encounter (HOSPITAL_COMMUNITY): Payer: Self-pay | Admitting: Pharmacy Technician

## 2012-08-20 LAB — GLUCOSE, CAPILLARY

## 2012-08-20 LAB — BASIC METABOLIC PANEL
BUN: 29 mg/dL — ABNORMAL HIGH (ref 6–23)
CO2: 35 mEq/L — ABNORMAL HIGH (ref 19–32)
Chloride: 91 mEq/L — ABNORMAL LOW (ref 96–112)
Creatinine, Ser: 1 mg/dL (ref 0.50–1.35)

## 2012-08-20 MED ORDER — VANCOMYCIN HCL IN DEXTROSE 1-5 GM/200ML-% IV SOLN
1000.0000 mg | INTRAVENOUS | Status: DC
  Administered 2012-08-20 – 2012-08-22 (×3): 1000 mg via INTRAVENOUS
  Filled 2012-08-20 (×4): qty 200

## 2012-08-20 MED ORDER — CEFAZOLIN SODIUM 1 G IJ SOLR: 2.0000 g | INTRAMUSCULAR | Status: DC

## 2012-08-20 MED ORDER — SODIUM CHLORIDE 0.9 % IR SOLN: 80.0000 mg | Status: DC

## 2012-08-20 MED ORDER — CHLORHEXIDINE GLUCONATE 4 % EX LIQD: 60.0000 mL | Freq: Once | CUTANEOUS | Status: DC

## 2012-08-20 MED ORDER — CHLORHEXIDINE GLUCONATE 4 % EX LIQD
60.0000 mL | Freq: Once | CUTANEOUS | Status: AC
  Administered 2012-08-21: 4 via TOPICAL
  Filled 2012-08-20: qty 60

## 2012-08-20 MED ORDER — SODIUM CHLORIDE 0.9 % IV SOLN: INTRAVENOUS | Status: DC

## 2012-08-20 NOTE — Progress Notes (Signed)
   ELECTROPHYSIOLOGY ROUNDING NOTE    Patient Name: Louis Acosta Date of Encounter: 08-20-2012     SUBJECTIVE:Patient with episode of confusion last night.  IV's pulled out and telemetry removed.  Pt tried to leave unit.  Pt aware that he had episode, back to baseline this morning.  Feels like breathing is improved.  Patient pending LV lead extraction 08-21-2012.  I/O -2.7L yesterday, weight down to 2 pounds, down 5 pounds since admission  TELEMETRY: Reviewed telemetry pt in sinus rhythm with ventricular pacing Filed Vitals:   08/19/12 1100 08/19/12 1323 08/19/12 2258 08/20/12 0508  BP: 132/66 126/57 118/68 120/56  Pulse: 85 77 72 73  Temp:  98.1 F (36.7 C) 98.2 F (36.8 C) 97.9 F (36.6 C)  TempSrc:  Oral Oral Oral  Resp: 20 20 18 19   Height:      Weight:    213 lb 14.4 oz (97.024 kg)  SpO2: 93% 97% 95% 97%    Intake/Output Summary (Last 24 hours) at 08/20/12 1610 Last data filed at 08/20/12 0202  Gross per 24 hour  Intake   1850 ml  Output   1235 ml  Net    615 ml    LABS: Basic Metabolic Panel:  Recent Labs  96/04/54 0430 08/20/12 0442  NA 133* 134*  K 3.7 4.3  CL 90* 91*  CO2 38* 35*  GLUCOSE 132* 155*  BUN 30* 29*  CREATININE 1.02 1.00  CALCIUM 10.0 9.9   CBC:  Recent Labs  08/17/12 1120 08/18/12 0421  WBC 6.4 7.0  HGB 13.8 13.8  HCT 40.2 40.7  MCV 97.3 97.1  PLT 77* 86*    Radiology/Studies:  Dg Chest 2 View 08/16/2012   *RADIOLOGY REPORT*  Clinical Data: Shortness of breath, right neck infection, preop  CHEST - 2 VIEW  Comparison: 06/20/2012  Findings: Lungs are essentially clear.  No focal consolidation. No pleural effusion or pneumothorax.  Cardiomegaly.  Left subclavian ICD.  One of the leads/wires is looped in the right supraclavicular region, unchanged since 2010.  IMPRESSION: No evidence of acute cardiopulmonary disease.   Original Report Authenticated By: Charline Bills, M.D.   PHYSICAL EXAM Well appearing elderly man, NAD HEENT:  Unremarkable except for residual swelling over right neck Neck:  6 cm JVD, no thyromegally Back:  No CVA tenderness Lungs:  Clear with no wheezes HEART:  Regular rate rhythm, no murmurs, no rubs, no clicks Abd:  Soft, obese, positive bowel sounds, no organomegally, no rebound, no guarding Ext:  2 plus pulses, no edema, no cyanosis, no clubbing Skin:  No rashes no nodules Neuro:  CN II through XII intact, motor grossly intact  A/P 1. PPM pocket infection 2. Acute on chronic systolic CHF 3. Transient confusion Rec: Plan to remove LV lead tomorrow. He may require entire system removal and insertion of a temp-perm PM and I have discussed this with the patient and his family.   Leonia Reeves.D.

## 2012-08-20 NOTE — Clinical Documentation Improvement (Signed)
  DOCUMENTATION CLARIFICATIONS ARE NOT PART OF THE PERMANENT MEDICAL RECORD   Please update your documentation in the medical record to reflect your response to this Clarification                                                                                      08/20/12  Dr. Ladona Ridgel,  In a better effort to capture your patient's severity of illness, reflect appropriate length of stay and utilization of resources, a review of the patient medical record has revealed the following information:  Patient noted to be hypoxic on admission.  ABG drawn on Room Air: Component     Latest Ref Rng 08/16/2012  pH, Arterial     7.350 - 7.450 7.352  pCO2 arterial     35.0 - 45.0 mmHg 58.3 (HH)  pO2, Arterial     80.0 - 100.0 mmHg 48.0 (L)  Bicarbonate     20.0 - 24.0 mEq/L 32.3 (H)  TCO2     0 - 100 mmol/L 34  O2 Saturation      81.0  Acid-Base Excess     0.0 - 2.0 mmol/L 5.0 (H)  Patient temperature      98.6 F  Collection site      RADIAL, ALLEN'S TEST ACCEPTABLE  Drawn by      RT  Sample type      ARTERIAL   Patient admitted to 2900 and started on 02 at 4 liters nasal cannula    Based on your clinical judgment, please document in the progress notes and discharge summary if a condition below provides greater specificity regarding the patient's respiratory status on admission:   - Acute Hypoxic Respiratory Failure, improved.   - Other Condition   - Unable to Clinically Determine    Please exercise your independent judgment in responding to this documentation clarification.     The fact that a documentation clarification is asked, does not imply that any particular answer is desired or expected.    Reviewed: Ok to include Acute respiratory improved  Thank You,  Jerral Ralph  RN BSN CCDS Certified Clinical Documentation Specialist: Cell   [phone]  Health Information Management [place]   TO RESPOND TO THE THIS QUERY, FOLLOW THE INSTRUCTIONS BELOW:   1. If needed, update documentation for the patient's encounter via the notes activity.  2. Access this query again and click edit on the In [employer].  3. After updating, or not, click F2 to complete all highlighted (required) fields concerning your review. Select "additional documentation in the medical record" OR "no additional documentation provided".  4. Click Sign note button.  5. The deficiency will fall out of your In Basket *Please let us know if you are not able to complete this workflow by phone or e-mail (listed below).

## 2012-08-20 NOTE — Progress Notes (Addendum)
ANTIBIOTIC CONSULT NOTE   Pharmacy Consult for vancomycin Indication: ICD lead infection  Allergies  Allergen Reactions  . Clindamycin/Lincomycin Nausea And Vomiting   Patient Measurements: Height: 5\' 11"  (180.3 cm) Weight: 213 lb 14.4 oz (97.024 kg) (Scale C) IBW/kg (Calculated) : 75.3  Vital Signs: Temp: 97.6 F (36.4 C) (06/03 1500) Temp src: Oral (06/03 1500) BP: 108/51 mmHg (06/03 1500) Pulse Rate: 80 (06/03 1500) Intake/Output from previous day: 06/02 0701 - 06/03 0700 In: 2290 [P.O.:1640; I.V.:250; IV Piggyback:400] Out: 1035 [Urine:1035] Intake/Output from this shift: Total I/O In: 603 [P.O.:600; I.V.:3] Out: -   Labs:  Recent Labs  08/18/12 0421 08/19/12 0430 08/20/12 0442  WBC 7.0  --   --   HGB 13.8  --   --   PLT 86*  --   --   CREATININE 1.05 1.02 1.00   Estimated Creatinine Clearance: 71.2 ml/min (by C-G formula based on Cr of 1).  Recent Labs  08/20/12 1618  VANCOTROUGH 20.9*    Microbiology: Recent Results (from the past 720 hour(s))  MRSA PCR SCREENING     Status: None   Collection Time    08/16/12  9:58 PM      Result Value Range Status   MRSA by PCR NEGATIVE  NEGATIVE Final   Comment:            The GeneXpert MRSA Assay (FDA     approved for NASAL specimens     only), is one component of a     comprehensive MRSA colonization     surveillance program. It is not     intended to diagnose MRSA     infection nor to guide or     monitor treatment for     MRSA infections.    Medical History: Past Medical History  Diagnosis Date  . Cardiomyopathy     broken 3x's  . History of fracture of nose   . Diabetes mellitus     type 2  . Hyperlipidemia   . Arthritis    Assessment: 55 yom with history of NICM, heart block and HF with PM insertaion presented to the ED for evaluation of a poorly hearling, draining R neck wound. He has been treated with doxycycline without improvement and also clindamycin but the patient was unable to  tolerate this. To start empiric vancomycin for possible infected ICD lead. Pt is afebrile and WBC is WNL. Pt has adequate renal function with an estimated clearance > 50 ml/min.  He has tolerated his Vancomycin - day #5 without noted complications.  His level returns this evening at 20.9 mcg/ml which is slightly above desired goal.  Length of therapy has not yet been decided.   Goal of Therapy:  Vancomycin trough level 10-15 mcg/ml  Plan:  1. Will decrease Vanc to 1000mg  IV Q18H with his next dose. 2. F/u renal function, C&S, clinical status and trough at [facility]  Nadara Mustard, PharmD., MS Clinical Pharmacist Pager:  [phone] Thank you for allowing pharmacy to be part of this patients care team. 08/20/2012,5:44 PM

## 2012-08-20 NOTE — Evaluation (Signed)
Physical Therapy Evaluation Patient Details Name: Louis Acosta MRN: [id number] DOB: 12/27/1931 Today's Date: 08/20/2012 Time: 4098-1191 PT Time Calculation (min): 26 min  PT Assessment / Plan / Recommendation Clinical Impression  79 y/o WM with decreased I with functional mobility with o2 sats dropping to 86%, but quickly increased to 90s when pt stopped and did pursed lip breathing.    PT Assessment  Patient needs continued PT services    Follow Up Recommendations  Home health PT;No PT follow up (depending on how pt is doing after procedure)    Does the patient have the potential to tolerate intense rehabilitation      Barriers to Discharge        Equipment Recommendations  None recommended by PT    Recommendations for Other Services     Frequency Min 3X/week    Precautions / Restrictions Precautions Precautions: None   Pertinent Vitals/Pain Denies pain      Mobility  Transfers Transfers: Sit to Stand Sit to Stand: 6: Modified independent (Device/Increase time) (with difficulty) Ambulation/Gait Ambulation/Gait Assistance: 4: Min guard;4: Min assist Ambulation Distance (Feet): 60 Feet Assistive device: Rolling walker Gait Pattern: Decreased step length - right Gait velocity: slow with monitoring of o2 level General Gait Details: CUes to keep RW closer.  o2 decreased to 86% but if pt stopped and did pursed lip breathing it would increase into 90s on room air.  Was bale to maintain 902 for last 25 feet of gait without stopping.    Exercises     PT Diagnosis: Difficulty walking  PT Problem List: Decreased activity tolerance;Decreased mobility;Cardiopulmonary status limiting activity PT Treatment Interventions: Gait training;Stair training;Functional mobility training;Therapeutic activities;Therapeutic exercise   PT Goals Acute Rehab PT Goals PT Goal Formulation: With patient Pt will go Sit to Stand: with modified independence PT Goal: Sit to Stand - Progress:  Goal set today Pt will Ambulate: 51 - 150 feet;with rolling walker;with supervision PT Goal: Ambulate - Progress: Goal set today Pt will Go Up / Down Stairs: Flight;with supervision;with rail(s) PT Goal: Up/Down Stairs - Progress: Goal set today  Visit Information  Last PT Received On: 08/20/12 Assistance Needed: +1    Subjective Data  Subjective: Wife and duaghter present.  Pt joking. Patient Stated Goal: to return to walking track with rollator   Prior Functioning  Home Living Lives With: Spouse Available Help at Discharge: Family Type of Home: House Home Access: Level entry Home Layout: Two level;Bed/bath upstairs Alternate Level Stairs-Rails: Can reach both Bathroom Shower/Tub: Tub/shower unit Home Adaptive Equipment: Straight cane;Walker - four wheeled Additional Comments: used rollator and cane sometimes.  Pt would walk on track at church several times/week with daughter and wife. Prior Function Level of Independence: Independent with assistive device(s);Independent Able to Take Stairs?: Yes Communication Communication: No difficulties    Cognition  Cognition Arousal/Alertness: Awake/alert Behavior During Therapy: WFL for tasks assessed/performed Overall Cognitive Status: Within Functional Limits for tasks assessed    Extremity/Trunk Assessment Right Lower Extremity Assessment RLE ROM/Strength/Tone: [facility] for tasks assessed Left Lower Extremity Assessment LLE ROM/Strength/Tone: [facility] for tasks assessed   Balance    End of Session PT - End of Session Equipment Utilized During Treatment: Gait belt Activity Tolerance: Patient tolerated treatment well Patient left: in chair;with family/visitor present Nurse Communication: Mobility status  GP     Alfonso Shackett LUBECK 08/20/2012, 1:31 PM

## 2012-08-20 NOTE — Progress Notes (Signed)
08/20/12 1025 Pt. to have pacer wire extraction, with possible complete removal of pacemaker, tomorrow.  Pt. may benefit from PT/OT consult to assist with discharge planning.  Anticipate dc in next 2 days. Tera Mater, RN, BSN NCM [phone]

## 2012-08-21 ENCOUNTER — Inpatient Hospital Stay: Admit: 2012-08-21 | Payer: Self-pay | Admitting: Internal Medicine

## 2012-08-21 ENCOUNTER — Inpatient Hospital Stay (HOSPITAL_COMMUNITY): Payer: Medicare Other | Admitting: Anesthesiology

## 2012-08-21 ENCOUNTER — Encounter (HOSPITAL_COMMUNITY): Payer: Self-pay | Admitting: Anesthesiology

## 2012-08-21 ENCOUNTER — Encounter (HOSPITAL_COMMUNITY)
Admission: EM | Disposition: A | Discharge status: Home / Self Care | Payer: Self-pay | Source: Home / Self Care | Attending: Internal Medicine

## 2012-08-21 DIAGNOSIS — T827XXA Infection and inflammatory reaction due to other cardiac and vascular devices, implants and grafts, initial encounter: Secondary | ICD-10-CM

## 2012-08-21 HISTORY — PX: PACEMAKER LEAD REMOVAL: SHX5064

## 2012-08-21 LAB — GLUCOSE, CAPILLARY
Glucose-Capillary: 134 mg/dL — ABNORMAL HIGH (ref 70–99)
Glucose-Capillary: 143 mg/dL — ABNORMAL HIGH (ref 70–99)
Glucose-Capillary: 151 mg/dL — ABNORMAL HIGH (ref 70–99)
Glucose-Capillary: 162 mg/dL — ABNORMAL HIGH (ref 70–99)

## 2012-08-21 LAB — CBC
MCV: 94.6 fL (ref 78.0–100.0)
Platelets: 84 10*3/uL — ABNORMAL LOW (ref 150–400)
RBC: 4.07 MIL/uL — ABNORMAL LOW (ref 4.22–5.81)
WBC: 8.6 10*3/uL (ref 4.0–10.5)

## 2012-08-21 LAB — BASIC METABOLIC PANEL
CO2: 36 mEq/L — ABNORMAL HIGH (ref 19–32)
Calcium: 9.8 mg/dL (ref 8.4–10.5)
Creatinine, Ser: 1.16 mg/dL (ref 0.50–1.35)
Glucose, Bld: 129 mg/dL — ABNORMAL HIGH (ref 70–99)

## 2012-08-21 LAB — CREATININE, SERUM
Creatinine, Ser: 1.05 mg/dL (ref 0.50–1.35)
GFR calc Af Amer: 76 mL/min — ABNORMAL LOW (ref >90)
GFR calc non Af Amer: 65 mL/min — ABNORMAL LOW (ref >90)

## 2012-08-21 LAB — SURGICAL PCR SCREEN
MRSA, PCR: NEGATIVE
Staphylococcus aureus: POSITIVE — AB

## 2012-08-21 LAB — TYPE AND SCREEN: ABO/RH(D): B POS

## 2012-08-21 SURGERY — REMOVAL, ELECTRODE LEAD, CARDIAC PACEMAKER, WITHOUT REPLACEMENT
Anesthesia: General | Site: Chest | Wound class: Dirty or Infected

## 2012-08-21 MED ORDER — CEFAZOLIN SODIUM 1-5 GM-% IV SOLN
INTRAVENOUS | Status: AC
  Filled 2012-08-21: qty 100

## 2012-08-21 MED ORDER — LIDOCAINE HCL (PF) 1 % IJ SOLN
INTRAMUSCULAR | Status: AC
  Filled 2012-08-21: qty 30

## 2012-08-21 MED ORDER — CEFAZOLIN SODIUM-DEXTROSE 2-3 GM-% IV SOLR
INTRAVENOUS | Status: AC
  Administered 2012-08-21: 2 g via INTRAVENOUS
  Filled 2012-08-21: qty 50

## 2012-08-21 MED ORDER — CEFAZOLIN SODIUM-DEXTROSE 2-3 GM-% IV SOLR
2.0000 g | INTRAVENOUS | Status: AC
  Administered 2012-08-21: 2 g via INTRAVENOUS
  Filled 2012-08-21: qty 50

## 2012-08-21 MED ORDER — ROCURONIUM BROMIDE 100 MG/10ML IV SOLN
INTRAVENOUS | Status: DC | PRN
  Administered 2012-08-21: 10 mg via INTRAVENOUS
  Administered 2012-08-21: 50 mg via INTRAVENOUS

## 2012-08-21 MED ORDER — SODIUM CHLORIDE 0.9 % IR SOLN
Status: AC
  Administered 2012-08-21: 10:00:00
  Filled 2012-08-21: qty 2

## 2012-08-21 MED ORDER — FENTANYL CITRATE 0.05 MG/ML IJ SOLN
INTRAMUSCULAR | Status: DC | PRN
  Administered 2012-08-21 (×3): 50 ug via INTRAVENOUS

## 2012-08-21 MED ORDER — SODIUM CHLORIDE 0.9 % IR SOLN
Status: DC | PRN
  Administered 2012-08-21: 10:00:00

## 2012-08-21 MED ORDER — ONDANSETRON HCL 4 MG/2ML IJ SOLN: 4.0000 mg | Freq: Four times a day (QID) | INTRAMUSCULAR | Status: DC | PRN

## 2012-08-21 MED ORDER — ACETAMINOPHEN 10 MG/ML IV SOLN
1000.0000 mg | Freq: Once | INTRAVENOUS | Status: DC | PRN
  Filled 2012-08-21: qty 100

## 2012-08-21 MED ORDER — NEOSTIGMINE METHYLSULFATE 1 MG/ML IJ SOLN
INTRAMUSCULAR | Status: DC | PRN
  Administered 2012-08-21: 3 mg via INTRAVENOUS

## 2012-08-21 MED ORDER — CEFAZOLIN SODIUM-DEXTROSE 2-3 GM-% IV SOLR
2.0000 g | Freq: Four times a day (QID) | INTRAVENOUS | Status: AC
  Administered 2012-08-21 (×3): 2 g via INTRAVENOUS
  Filled 2012-08-21 (×3): qty 50

## 2012-08-21 MED ORDER — FENTANYL CITRATE 0.05 MG/ML IJ SOLN: 25.0000 ug | INTRAMUSCULAR | Status: DC | PRN

## 2012-08-21 MED ORDER — PHENYLEPHRINE HCL 10 MG/ML IJ SOLN
10.0000 mg | INTRAVENOUS | Status: DC | PRN
  Administered 2012-08-21: 15 ug/min via INTRAVENOUS

## 2012-08-21 MED ORDER — METOCLOPRAMIDE HCL 5 MG/ML IJ SOLN
10.0000 mg | Freq: Once | INTRAMUSCULAR | Status: DC | PRN
  Filled 2012-08-21: qty 2

## 2012-08-21 MED ORDER — ACETAMINOPHEN 325 MG PO TABS: 325.0000 mg | ORAL_TABLET | ORAL | Status: DC | PRN

## 2012-08-21 MED ORDER — MENTHOL 3 MG MT LOZG
1.0000 | LOZENGE | OROMUCOSAL | Status: DC | PRN
  Administered 2012-08-21: 3 mg via ORAL
  Filled 2012-08-21: qty 9

## 2012-08-21 MED ORDER — LACTATED RINGERS IV SOLN
INTRAVENOUS | Status: DC | PRN
  Administered 2012-08-21: 08:00:00 via INTRAVENOUS

## 2012-08-21 MED ORDER — HEPARIN SODIUM (PORCINE) 5000 UNIT/ML IJ SOLN
5000.0000 [IU] | Freq: Three times a day (TID) | INTRAMUSCULAR | Status: DC
  Administered 2012-08-21 – 2012-08-22 (×4): 5000 [IU] via SUBCUTANEOUS
  Filled 2012-08-21 (×6): qty 1

## 2012-08-21 MED ORDER — GLYCOPYRROLATE 0.2 MG/ML IJ SOLN
INTRAMUSCULAR | Status: DC | PRN
  Administered 2012-08-21: .6 mg via INTRAVENOUS

## 2012-08-21 MED ORDER — ETOMIDATE 2 MG/ML IV SOLN
INTRAVENOUS | Status: DC | PRN
  Administered 2012-08-21: 14 mg via INTRAVENOUS

## 2012-08-21 MED ORDER — LIDOCAINE HCL (PF) 1 % IJ SOLN
INTRAMUSCULAR | Status: DC | PRN
  Administered 2012-08-21 (×2): 30 mL

## 2012-08-21 SURGICAL SUPPLY — 37 items
BLADE STERNUM SYSTEM 6 (BLADE) IMPLANT
BLADE SURG 10 STRL SS (BLADE) ×2 IMPLANT
BLADE SURG 15 STRL LF DISP TIS (BLADE) IMPLANT
BLADE SURG 15 STRL SS (BLADE) ×2
BNDG ADH 5X4 AIR PERM ELC (GAUZE/BANDAGES/DRESSINGS)
BNDG COHESIVE 4X5 WHT NS (GAUZE/BANDAGES/DRESSINGS) IMPLANT
CANISTER SUCTION 2500CC (MISCELLANEOUS) ×2 IMPLANT
CLOTH BEACON ORANGE TIMEOUT ST (SAFETY) ×2 IMPLANT
COVER DOME SNAP 22 D (MISCELLANEOUS) ×1 IMPLANT
COVER TABLE BACK 60X90 (DRAPES) ×2 IMPLANT
DRAPE CARDIOVASCULAR INCISE (DRAPES) ×2
DRAPE INCISE IOBAN 66X45 STRL (DRAPES) ×2 IMPLANT
DRAPE SRG 135X102X78XABS (DRAPES) ×1 IMPLANT
DRSG OPSITE 6X11 MED (GAUZE/BANDAGES/DRESSINGS) IMPLANT
ELECT REM PT RETURN 9FT ADLT (ELECTROSURGICAL) ×4
ELECTRODE REM PT RTRN 9FT ADLT (ELECTROSURGICAL) ×2 IMPLANT
GAUZE SPONGE 4X4 16PLY XRAY LF (GAUZE/BANDAGES/DRESSINGS) IMPLANT
GLOVE BIO SURGEON STRL SZ 6.5 (GLOVE) ×1 IMPLANT
GLOVE BIO SURGEON STRL SZ8 (GLOVE) ×2 IMPLANT
GLOVE BIOGEL PI IND STRL 7.0 (GLOVE) IMPLANT
GLOVE BIOGEL PI IND STRL 7.5 (GLOVE) ×1 IMPLANT
GLOVE BIOGEL PI INDICATOR 7.0 (GLOVE) ×4
GLOVE BIOGEL PI INDICATOR 7.5 (GLOVE) ×3
GOWN PREVENTION PLUS XLARGE (GOWN DISPOSABLE) ×2 IMPLANT
GOWN STRL NON-REIN LRG LVL3 (GOWN DISPOSABLE) IMPLANT
KIT ROOM TURNOVER OR (KITS) ×2 IMPLANT
PAD ARMBOARD 7.5X6 YLW CONV (MISCELLANEOUS) ×4 IMPLANT
PAD ELECT DEFIB RADIOL ZOLL (MISCELLANEOUS) ×2 IMPLANT
PENCIL BUTTON HOLSTER BLD 10FT (ELECTRODE) IMPLANT
SPONGE GAUZE 4X4 12PLY (GAUZE/BANDAGES/DRESSINGS) IMPLANT
SUT PROLENE 2 0 SH DA (SUTURE) ×1 IMPLANT
SUT SILK 0 FSL (SUTURE) IMPLANT
SUT VIC AB 3-0 X1 27 (SUTURE) ×2 IMPLANT
TOWEL OR 17X24 6PK STRL BLUE (TOWEL DISPOSABLE) ×4 IMPLANT
TRAY FOLEY IC TEMP SENS 14FR (CATHETERS) ×2 IMPLANT
TUBE CONNECTING 12X1/4 (SUCTIONS) IMPLANT
YANKAUER SUCT BULB TIP NO VENT (SUCTIONS) IMPLANT

## 2012-08-21 NOTE — Progress Notes (Signed)
OT Cancellation Note  Patient Details Name: Louis Acosta MRN: [id number] DOB: 1931/05/18   Cancelled Treatment:    Reason Eval/Treat Not Completed: Patient at procedure or test/ unavailable. Will continue to follow.  [name] 08/21/2012, 9:26 AM

## 2012-08-21 NOTE — Progress Notes (Signed)
   ELECTROPHYSIOLOGY ROUNDING NOTE    Patient Name: Louis Acosta Date of Encounter: 08-20-2012     SUBJECTIVE:Patient feels well this morning.  No chest pain or shortness of breath.  No further confusion.  For LV lead extraction this morning.   I/O -1.5L yesterday, but weight up one pound according to chart.   BMET pending this morning.   TELEMETRY: Reviewed telemetry pt in sinus rhythm with ventricular pacing Filed Vitals:   08/20/12 1017 08/20/12 1500 08/20/12 2034 08/21/12 0516  BP: 133/61 108/51 136/58 107/55  Pulse: 80 80 78 85  Temp:  97.6 F (36.4 C) 98.8 F (37.1 C) 98 F (36.7 C)  TempSrc:  Oral Oral Oral  Resp: 16 18 18 18   Height:      Weight:    214 lb 8.1 oz (97.3 kg)  SpO2: 92% 93% 95%     Intake/Output Summary (Last 24 hours) at 08/21/12 0624 Last data filed at 08/20/12 1900  Gross per 24 hour  Intake    843 ml  Output      0 ml  Net    843 ml   Physical Exam Well appearing NAD HEENT: Unremarkable except for residual tenderness around the right neck with minimal erythema Neck:  No JVD, no thyromegally Lymphatics:  No adenopathy Back:  No CVA tenderness Lungs:  Clear with no wheezes HEART:  Regular rate rhythm, no murmurs, no rubs, no clicks Abd:  Flat, positive bowel sounds, no organomegally, no rebound, no guarding Ext:  2 plus pulses, no edema, no cyanosis, no clubbing Skin:  No rashes no nodules Neuro:  CN II through XII intact, motor grossly intact  LABS: Basic Metabolic Panel:  Recent Labs  16/10/96 0430 08/20/12 0442  NA 133* 134*  K 3.7 4.3  CL 90* 91*  CO2 38* 35*  GLUCOSE 132* 155*  BUN 30* 29*  CREATININE 1.02 1.00  CALCIUM 10.0 9.9    Radiology/Studies:  Dg Chest 2 View 08/16/2012   *RADIOLOGY REPORT*  Clinical Data: Shortness of breath, right neck infection, preop  CHEST - 2 VIEW  Comparison: 06/20/2012  Findings: Lungs are essentially clear.  No focal consolidation. No pleural effusion or pneumothorax.  Cardiomegaly.   Left subclavian ICD.  One of the leads/wires is looped in the right supraclavicular region, unchanged since 2010.  IMPRESSION: No evidence of acute cardiopulmonary disease.   Original Report Authenticated By: Charline Bills, M.D.    A/P  1. 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 As site is remote from pocket, will plan to extract LV in isolation and hope to retain other leads.  2. Acute on chronic systolic/diastolic CHF - stable and euvolemic  Keidy Thurgood,M.D.

## 2012-08-21 NOTE — Anesthesia Procedure Notes (Signed)
Procedure Name: Intubation Date/Time: 08/21/2012 9:00 AM Performed by: Gwenyth Allegra Pre-anesthesia Checklist: Emergency Drugs available, Patient identified, Patient being monitored and Timeout performed Patient Re-evaluated:Patient Re-evaluated prior to inductionOxygen Delivery Method: Circle system utilized Intubation Type: IV induction Laryngoscope Size: Mac and 4 Grade View: Grade I Tube type: Oral Tube size: 7.5 mm Number of attempts: 1 Airway Equipment and Method: Stylet Dental Injury: Teeth and Oropharynx as per pre-operative assessment

## 2012-08-21 NOTE — Transfer of Care (Signed)
Immediate Anesthesia Transfer of Care Note  Patient: Louis Acosta  Procedure(s) Performed: Procedure(s): PACEMAKER LEAD REMOVAL (N/A)  Patient Location: PACU  Anesthesia Type:General  Level of Consciousness: awake and alert   Airway & Oxygen Therapy: Patient Spontanous Breathing and Patient connected to face mask oxygen  Post-op Assessment: Report given to PACU RN and Post -op Vital signs reviewed and stable  Post vital signs: Reviewed and stable  Complications: No apparent anesthesia complications

## 2012-08-21 NOTE — OR Nursing (Signed)
Removed r radial aline/ cath intact, site cdi, pressure held and pressure drsg applied post

## 2012-08-21 NOTE — Progress Notes (Signed)
Pt stated they feel like their hands are swollen since surgery . Will continue to monitor

## 2012-08-21 NOTE — Anesthesia Preprocedure Evaluation (Signed)
Anesthesia Evaluation  Patient identified by MRN, date of birth, ID band Patient awake    Reviewed: Allergy & Precautions, H&P , NPO status , Patient's Chart, lab work & pertinent test results, reviewed documented beta blocker date and time   Airway Mallampati: II TM Distance: >3 FB Neck ROM: full    Dental   Pulmonary shortness of breath,  breath sounds clear to auscultation        Cardiovascular hypertension, On Medications and On Home Beta Blockers + angina + CAD, +CHF and + DOE + pacemaker Rhythm:regular     Neuro/Psych negative neurological ROS  negative psych ROS   GI/Hepatic negative GI ROS, Neg liver ROS,   Endo/Other  diabetes, Insulin Dependent  Renal/GU negative Renal ROS  negative genitourinary   Musculoskeletal   Abdominal   Peds  Hematology negative hematology ROS (+)   Anesthesia Other Findings See surgeon's H&P   Reproductive/Obstetrics negative OB ROS                           Anesthesia Physical Anesthesia Plan  ASA: IV  Anesthesia Plan: General   Post-op Pain Management:    Induction: Intravenous  Airway Management Planned: Oral ETT  Additional Equipment:   Intra-op Plan:   Post-operative Plan: Extubation in OR  Informed Consent: I have reviewed the patients History and Physical, chart, labs and discussed the procedure including the risks, benefits and alternatives for the proposed anesthesia with the patient or authorized representative who has indicated his/her understanding and acceptance.   Dental Advisory Given  Plan Discussed with: CRNA and Surgeon  Anesthesia Plan Comments:         Anesthesia Quick Evaluation

## 2012-08-21 NOTE — Anesthesia Postprocedure Evaluation (Signed)
Anesthesia Post Note  Patient: Louis Acosta  Procedure(s) Performed: Procedure(s) (LRB): PACEMAKER LEAD REMOVAL (N/A)  Anesthesia type: general  Patient location: PACU  Post pain: Pain level controlled  Post assessment: Patient's Cardiovascular Status Stable  Last Vitals:  Filed Vitals:   08/21/12 1230  BP:   Pulse:   Temp: 36.2 C  Resp: 16    Post vital signs: Reviewed and stable  Level of consciousness: sedated  Complications: No apparent anesthesia complications

## 2012-08-21 NOTE — Op Note (Signed)
LV lead extraction without immediate complication. Y#[id number].

## 2012-08-22 ENCOUNTER — Inpatient Hospital Stay (HOSPITAL_COMMUNITY): Payer: Medicare Other

## 2012-08-22 ENCOUNTER — Encounter: Payer: Self-pay | Admitting: Internal Medicine

## 2012-08-22 LAB — BASIC METABOLIC PANEL
Calcium: 9.5 mg/dL (ref 8.4–10.5)
Creatinine, Ser: 1.11 mg/dL (ref 0.50–1.35)
GFR calc Af Amer: 71 mL/min — ABNORMAL LOW (ref >90)
GFR calc non Af Amer: 61 mL/min — ABNORMAL LOW (ref >90)

## 2012-08-22 LAB — GLUCOSE, CAPILLARY
Glucose-Capillary: 122 mg/dL — ABNORMAL HIGH (ref 70–99)
Glucose-Capillary: 176 mg/dL — ABNORMAL HIGH (ref 70–99)

## 2012-08-22 MED ORDER — FUROSEMIDE 40 MG PO TABS: 60.0000 mg | ORAL_TABLET | Freq: Two times a day (BID) | ORAL | Status: DC

## 2012-08-22 MED ORDER — LEVALBUTEROL HCL 0.63 MG/3ML IN NEBU
0.6300 mg | INHALATION_SOLUTION | Freq: Once | RESPIRATORY_TRACT | Status: AC
  Administered 2012-08-22: 0.63 mg via RESPIRATORY_TRACT
  Filled 2012-08-22 (×2): qty 3

## 2012-08-22 MED ORDER — METOPROLOL SUCCINATE ER 100 MG PO TB24: 100.0000 mg | ORAL_TABLET | Freq: Every day | ORAL | Status: DC

## 2012-08-22 NOTE — Progress Notes (Signed)
Physical Therapy Treatment Patient Details Name: Louis Acosta MRN: [id number] DOB: 11/16/1931 Today's Date: 08/22/2012 Time: 7846-9629 PT Time Calculation (min): 30 min  PT Assessment / Plan / Recommendation Comments on Treatment Session  Pt able to increase ambulation distance however pt with increase SOB and decrease SaO2 on room air.  Pt sat EOB on RA and SaO2 decreased to 76% donned 3L and educated on pursed lip breathing and SaO2 increased to 93%.  During ambulation pt's SaO2 decreased to 82% on RA therefore donned 3L O2 and Sa02 increased to 95%.  Pt needs constant cues for proper breathing technique and tends to breath through the mouth.  RN notified about the possible need for home O2 and charted numbers.    Follow Up Recommendations  Home health PT;Supervision/Assistance - 24 hour     Does the patient have the potential to tolerate intense rehabilitation     Barriers to Discharge        Equipment Recommendations  Other (comment) (home O2)    Recommendations for Other Services    Frequency Min 3X/week   Plan Discharge plan remains appropriate;Frequency remains appropriate    Precautions / Restrictions Precautions Precautions: Fall Restrictions Weight Bearing Restrictions: No   Pertinent Vitals/Pain No c/o pain    Mobility  Bed Mobility Bed Mobility: Supine to Sit Supine to Sit: 4: Min assist;HOB flat;With rails Details for Bed Mobility Assistance: (A) to elevate trunk OOB with cues for technique.  Pt with heavy use of rail. Transfers Transfers: Sit to Stand Sit to Stand: 4: Min assist;From bed Stand to Sit: 4: Min assist;To chair/3-in-1 Details for Transfer Assistance: (A) to initiate transfer and slowly descend to recliner with cues for hand placement Ambulation/Gait Ambulation/Gait Assistance: 4: Min guard Ambulation Distance (Feet): 80 Feet Assistive device: Rolling walker Ambulation/Gait Assistance Details: Minguard for safety with max cues for RW  placement and body position within RW.  Pt tends to ambulate too far from RW> Gait Pattern: Decreased step length - right Gait velocity: slow with monitoring of o2 level Stairs: No Wheelchair Mobility Wheelchair Mobility: No    Exercises     PT Diagnosis:    PT Problem List:   PT Treatment Interventions:     PT Goals Acute Rehab PT Goals PT Goal Formulation: With patient Time For Goal Achievement: 08/27/12 Potential to Achieve Goals: Good Pt will go Sit to Stand: with modified independence PT Goal: Sit to Stand - Progress: Progressing toward goal Pt will Ambulate: 51 - 150 feet;with rolling walker;with supervision PT Goal: Ambulate - Progress: Progressing toward goal  Visit Information  Last PT Received On: 08/22/12 Assistance Needed: +1    Subjective Data  Subjective: "I'm not sure I can walk." Patient Stated Goal: To go home today   Cognition  Cognition Arousal/Alertness: Awake/alert Behavior During Therapy: WFL for tasks assessed/performed Overall Cognitive Status: Impaired/Different from baseline Area of Impairment: Problem solving Problem Solving: Slow processing;Difficulty sequencing (Per wife pt was slightly confused and slow to understand)    Balance     End of Session PT - End of Session Equipment Utilized During Treatment: Gait belt;Oxygen (3L) Activity Tolerance: Patient tolerated treatment well Patient left: in chair;with family/visitor present Nurse Communication: Mobility status   GP     Tanika Bracco 08/22/2012, 11:19 AM Jake Shark, PT DPT [phone]

## 2012-08-22 NOTE — Progress Notes (Signed)
SATURATION QUALIFICATIONS: (This note is used to comply with regulatory documentation for home oxygen)  Patient Saturations on Room Air at Rest = 80%  Patient Saturations on Room Air while Ambulating = 79%  Patient Saturations on 3 Liters of oxygen while Ambulating = 93%  Please briefly explain why patient needs home oxygen:

## 2012-08-22 NOTE — Op Note (Signed)
Louis Acosta, Louis Acosta              ACCOUNT NO.:  [id number]  MEDICAL RECORD NO.:  [id number]  LOCATION:  4705                         FACILITY:  [facility]  PHYSICIAN:  Doylene Canning. Ladona Ridgel, MD    DATE OF BIRTH:  1931-12-12  DATE OF PROCEDURE:  08/21/2012 DATE OF DISCHARGE:                              OPERATIVE REPORT   PROCEDURE PERFORMED:  Extraction of the left ventricular lead.  INDICATION:  LV lead infection.  INTRODUCTION:  The patient is a 79 year old man with chronic systolic heart failure and complete heart block.  He underwent biventricular pacemaker revision approximately 2 years ago.  At that time, his left subclavian vein was occluded and an incision was made to place his left ventricular lead by way of the right internal jugular vein with the lead tunneled down to the old pocket.  In the interim, the patient lost approximately 40 pounds and the well-healed lead was underneath the skin that was constantly attached to other skin (skin fold) and unfortunately got a pimple and subsequent infection, which went down into the his pocket.  This was the LV lead pocket in the area lateral to the right internal jugular vein.  The course of antibiotics was unsuccessful in relieving the infection and with ongoing drainage.  He was admitted to the hospital with worsening heart failure symptoms, and after diuresis now presents for removal of his LV lead.  PROCEDURE IN DETAIL:  After informed consent was obtained, the patient was taken to the operating room in a fasting state.  After induction of general anesthesia and appropriate monitoring, 30 mL of lidocaine was infiltrated into the left pectoral region as well as to the midline region.  A 7-cm incision was carried out over this region and electrocautery was utilized to dissect down to the pacemaker pocket. The LV lead was disconnected from the can rather and the fibrous scar tissue was freed up with electrocautery.  At this point, a  3-cm incision was carried out over the sternal portion of the chest and this was done under fluoroscopic guidance, so that it could be made just over the LV lead.  The LV lead was pulled through from its old pocket and cut.  The 3-cm midsternal incision as well as the infraclavicular incision were then sewed up with 3-0 and 4-0 Vicryl suture.  At this point, attention was then turned to removal of the LV lead in total.  A 3-cm incision was carried out over the right clavicular region, over the internal jugular vein, and electrocautery was utilized to dissect down to the lead itself.  After removing the sewing sleeve, the lead was pulled through from the midsternal incision.  At this point, a liberator locking stylet was advanced into the lead approximately 3 cm from the lead tip.  The lead was locked in the proximal portion with the 1 tie suture.  At this point, gentle traction was placed on the lead and it was removed in total without any hemodynamic sequelae.  Pressure was held and hemostasis was assured.  The pocket was debrided and the incision was closed with 2-0 Prolene suture.  The patient was then returned to the recovery area for extubation  and further evaluation.  COMPLICATIONS:  There were no immediate procedure complications.  RESULTS:  This demonstrate successful removal of an LV pacing lead in a patient with complete heart block, without immediate procedure complications.     Doylene Canning. Ladona Ridgel, MD     GWT/MEDQ  D:  08/21/2012  T:  08/22/2012  Job:  [id number]  cc:   Dr. Katrinka Blazing

## 2012-08-22 NOTE — Discharge Summary (Signed)
ELECTROPHYSIOLOGY DISCHARGE SUMMARY    Patient ID: Louis Acosta,  MRN: [id number], DOB/AGE: 06/30/1931 79 y.o.  Admit date: 08/16/2012 Discharge date: 08/22/2012  Primary Care Physician: Marden Noble, MD Primary Cardiologist: Lewayne Bunting, MD  Primary Discharge Diagnosis:  1. BiV PPM lead infection (right IJ) s/p LV lead extraction 08/21/2012 2. Acute hypoxic respiratory failure - resolved 3. Acute on chronic systolic HF 4. Complete heart block - Louis Acosta is pacemaker dependent  Secondary Discharge Diagnoses:  1. NICM, has CRT-P in place 2. DM 3. Dyslipidemia 4. Osteoarthritis  Procedures This Admission:  1. 2D echo 08/19/2012 Study Conclusions - Procedure narrative: Transthoracic echocardiography. Image quality was adequate. The study was technically difficult. Intravenous contrast (Definity) was administered. - Left ventricle: The cavity size was severely dilated. Wall thickness was increased in a pattern of mild LVH. The estimated ejection fraction was 15%. There is akinesis of the entireinferior myocardium. Doppler parameters are consistent with high ventricular filling pressure. - Left atrium: The atrium was severely dilated. 2. BiV PPM right IJ LV lead extraction 08/21/2012   Brief History:  Louis Acosta is a 79 year old man with a long-standing NICM, complete heart block and chronic systolic heart failure status post bi-ventricular pacemaker insertion. His heart failure symptoms remain class II. He has noted chest pain with exertion. He takes nitroglycerin as needed for control. He has been followed by Dr. Ladona Ridgel. He has been followed most recently for evaluation of a poorly healing, draining wound. This wound on the lower right neck has been present since mid April. He has been treated with doxycycline. He was seen in follow-up by Dr. Ladona Ridgel on 08/13/2012 at which time it was decided he would undergo lead extraction. This was scheduled for this Wednesday, 08/21/2012.  In the interim, he was given a prescription for clindamycin; however, he was unable to tolerate this medication due to nausea.  On the day of admission 08/16/2012 it was draining pus and was quite tender to touch. He denies fever or chills. He was otherwise without complaints. He denied CP. He has chronic DOE. He denied palpitations, dizziness, near syncope or syncope. He denied LE swelling, orthopnea or PND.   Hospital Course: He was admitted to telemetry. On admission he was found to be hypoxic with an O2 sat 81% on room air. He was given 4L O2 by nasal cannula with improvement. He was diuresed with IV Lasix. He was continued on medical therapy for known LV dysfunction. Metoprolol tartrate was discontinued in favor of metoprolol succinate. He underwent right IJ LV lead extraction on 08/21/2012. Louis Acosta tolerated this procedure well without any immediate complication. He remains hemodynamically stable and afebrile. His explant site is intact without significant bleeding or hematoma. He has been given discharge instructions including wound care and activity restrictions. He will follow-up in 10 days for wound check. There were no changes made to his medications. He will be discharged on home O2. He has been seen, examined and deemed stable for discharge today by Dr. Lewayne Bunting. He will have early follow-up in the office with Dr. Ladona Ridgel to discuss timing of LV lead reimplantation.  Discharge Vitals: Blood pressure 126/75, pulse 72, temperature 98.2 F (36.8 C), temperature source Oral, resp. rate 20, height 5\' 11"  (1.803 m), weight 218 lb 6.4 oz (99.066 kg), SpO2 97.00%.   Labs: Lab Results  Component Value Date   WBC 8.6 08/21/2012   HGB 13.5 08/21/2012   HCT 38.5* 08/21/2012   MCV 94.6 08/21/2012  PLT 84* 08/21/2012    Recent Labs Lab 08/16/12 1558  08/22/12 0510  NA 136  < > 131*  K 4.4  < > 3.8  CL 96  < > 89*  CO2 31  < > 35*  BUN 21  < > 30*  CREATININE 0.93  < > 1.11  CALCIUM 9.5  < >  9.5  PROT 7.6  --   --   BILITOT 0.7  --   --   ALKPHOS 98  --   --   ALT 18  --   --   AST 23  --   --   GLUCOSE 231*  < > 119*  < > = values in this interval not displayed.   Disposition:  The patient is being discharged in stable condition.  Follow-up: Follow-up Information   Follow up with [facility] On 08/28/2012. (At 4:00 PM for wound check)    Contact information:   [address] [place] [address] [phone]     Discharge Medications:    Medication List    STOP taking these medications       clindamycin 300 MG capsule  Commonly known as:  CLEOCIN     metoprolol 50 MG tablet  Commonly known as:  LOPRESSOR      TAKE these medications       BAYER BREEZE 2 TEST VI  by In Vitro route 4 (four) times daily.     COMBIGAN 0.2-0.5 % ophthalmic solution  Generic drug:  brimonidine-timolol  Place 1 drop into the right eye every 12 (twelve) hours.     dorzolamide 2 % ophthalmic solution  Commonly known as:  TRUSOPT  Place 1 drop into the right eye 2 (two) times daily.     [facility] UNILET LANCETS 30G Misc  by Does not apply route.     fish oil-omega-3 fatty acids 1000 MG capsule  Take 1 g by mouth daily.     fluticasone 50 MCG/ACT nasal spray  Commonly known as:  FLONASE  Place 1 spray into the nose daily as needed for allergies.     furosemide 40 MG tablet  Commonly known as:  LASIX  Take 1.5 tablets (60 mg total) by mouth 2 (two) times daily.     insulin lispro 100 UNIT/ML injection  Commonly known as:  HUMALOG  Inject 10-20 Units into the skin daily as needed for high blood sugar.     LANTUS 100 UNIT/ML injection  Generic drug:  insulin glargine  Inject 20-70 Units into the skin at bedtime.     loratadine 10 MG tablet  Commonly known as:  CLARITIN  Take 10 mg by mouth daily as needed for allergies.     losartan 25 MG tablet  Commonly known as:  COZAAR  Take 25 mg by mouth daily.     metoprolol succinate 100 MG  24 hr tablet  Commonly known as:  TOPROL-XL  Take 1 tablet (100 mg total) by mouth daily. Take with or immediately following a meal.     multivitamin with minerals tablet  Take 1 tablet by mouth daily.     nitroGLYCERIN 0.4 MG SL tablet  Commonly known as:  NITROSTAT  Place 0.4 mg under the tongue every 5 (five) minutes as needed for chest pain.     OSTEO BI-FLEX TRIPLE STRENGTH PO  Take 1 tablet by mouth daily.     potassium gluconate 595 MG Tabs  Take 595 mg  by mouth daily.     Saw Palmetto 450 MG Caps  Take 450 mg by mouth 2 (two) times daily.     simvastatin 40 MG tablet  Commonly known as:  ZOCOR  Take 40 mg by mouth every evening.     travoprost (benzalkonium) 0.004 % ophthalmic solution  Commonly known as:  TRAVATAN  Place 1 drop into both eyes at bedtime.     VITAMIN-B COMPLEX PO  Take 1 tablet by mouth daily.       Duration of Discharge Encounter: Greater than 30 minutes including physician time.  Signed, Rick Duff, PA-C 08/22/2012, 6:05 PM

## 2012-08-22 NOTE — Evaluation (Signed)
Occupational Therapy Evaluation Patient Details Name: Louis Acosta MRN: [id number] DOB: 1931/04/14 Today's Date: 08/22/2012 Time: 8657-8469 OT Time Calculation (min): 37 min  OT Assessment / Plan / Recommendation Clinical Impression  Pt admitted with infection of pacemaker lead which was surgically removed yesterday.  After review of PT note of today, kept pt on 3L 02 throughout session with sats remaining in low 90s while walking to bathroom and sink.  Pt with decreased awareness of deficits and memory issues per wife who describes pt as "not quite himself."  Instructed pt and wife in purse lip breathing and energy conservation techniques. Will follow acutely to reinforce.    OT Assessment  Patient needs continued OT Services    Follow Up Recommendations  No OT follow up    Barriers to Discharge      Equipment Recommendations  None recommended by OT    Recommendations for Other Services    Frequency  Min 2X/week    Precautions / Restrictions Precautions Precautions: Fall Precaution Comments: watch 02 sats Restrictions Weight Bearing Restrictions: No   Pertinent Vitals/Pain No pain    ADL  Eating/Feeding: Independent Where Assessed - Eating/Feeding: Chair Grooming: Wash/dry hands;Supervision/safety Where Assessed - Grooming: Unsupported standing Upper Body Bathing: Supervision/safety Where Assessed - Upper Body Bathing: Unsupported sitting Lower Body Bathing: Minimal assistance Where Assessed - Lower Body Bathing: Unsupported sitting;Supported sit to stand Upper Body Dressing: Set up Where Assessed - Upper Body Dressing: Unsupported sitting Lower Body Dressing: Minimal assistance Where Assessed - Lower Body Dressing: Unsupported sitting;Supported sit to stand Toilet Transfer: Min Pension scheme manager Method: Sit to Barista: Raised toilet seat with arms (or 3-in-1 over toilet) Toileting - Clothing Manipulation and Hygiene:  Supervision/safety Where Assessed - Glass blower/designer Manipulation and Hygiene: Standing Equipment Used: Gait belt;Rolling walker (3L 02) Transfers/Ambulation Related to ADLs: min guard assist with RW ADL Comments: Pt is familiar with AE for donning socks, not interested.  Instructed pt in purse lip breathing with 02 during exertion.  Instructed pt on the benefits of home 02 should MD order it as pt somewhat resistant.    OT Diagnosis: Generalized weakness;Cognitive deficits  OT Problem List: Decreased strength;Decreased activity tolerance;Decreased cognition;Decreased safety awareness;Cardiopulmonary status limiting activity;Obesity;Increased edema OT Treatment Interventions: Self-care/ADL training;Patient/family education;Energy conservation   OT Goals Acute Rehab OT Goals OT Goal Formulation: With patient Time For Goal Achievement: 08/29/12 Potential to Achieve Goals: Good ADL Goals Pt Will Perform Grooming: with modified independence;Standing at sink (at least 2 activities) ADL Goal: Grooming - Progress: Goal set today Pt Will Transfer to Toilet: with modified independence;Ambulation;with DME ADL Goal: Toilet Transfer - Progress: Goal set today Pt Will Perform Toileting - Clothing Manipulation: with modified independence;Standing ADL Goal: Toileting - Clothing Manipulation - Progress: Goal set today Pt Will Perform Toileting - Hygiene: Independently;Sitting on 3-in-1 or toilet ADL Goal: Toileting - Hygiene - Progress: Goal set today Miscellaneous OT Goals Miscellaneous OT Goal #1: Pt will generalize energy conservation strategies and purse lip breathing techniques during ADL and mobility. OT Goal: Miscellaneous Goal #1 - Progress: Goal set today  Visit Information  Last OT Received On: 08/22/12 Assistance Needed: +1    Subjective Data  Subjective: "I don't need oxygen at home." Patient Stated Goal: Home with wife's assist.   Prior Functioning     Home Living Lives With:  Spouse Available Help at Discharge: Family Type of Home: House Home Access: Level entry Home Layout: Two level;Bed/bath upstairs Alternate Level Stairs-Rails: Can reach both  Bathroom Shower/Tub: Tub/shower unit;Door [employer] Toilet: Standard Home Adaptive Equipment: Clinical research associate - four wheeled;Shower chair with back;Hand-held shower hose;Grab bars around toilet Additional Comments: pt plans to have grab bar installed at tub Prior Function Level of Independence: Needs assistance Needs Assistance: Dressing Dressing: Minimal (for socks) Able to Take Stairs?: Yes Communication Communication: HOH Dominant Hand: Right         Vision/Perception Vision - History Baseline Vision: Wears glasses all the time Patient Visual Report: No change from baseline   Cognition  Cognition Arousal/Alertness: Awake/alert Behavior During Therapy: WFL for tasks assessed/performed Overall Cognitive Status: Impaired/Different from baseline Area of Impairment: Memory;Safety/judgement;Problem solving Memory: Decreased short-term memory Safety/Judgement: Decreased awareness of deficits;Decreased awareness of safety Problem Solving: Slow processing General Comments: wife reports concern with pt's processing and memory    Extremity/Trunk Assessment Right Upper Extremity Assessment RUE ROM/Strength/Tone: WFL for tasks assessed RUE Coordination: WFL - gross/fine motor Left Upper Extremity Assessment LUE ROM/Strength/Tone: WFL for tasks assessed (mild edema in hand) LUE Coordination: WFL - gross/fine motor     Mobility Bed Mobility Bed Mobility: Not assessed Supine to Sit: 4: Min assist;HOB flat;With rails Details for Bed Mobility Assistance: (A) to elevate trunk OOB with cues for technique.  Pt with heavy use of rail. Transfers Sit to Stand: 4: Min assist;With upper extremity assist;From chair/3-in-1 Stand to Sit: 4: Min assist;With upper extremity assist;To chair/3-in-1 Details for  Transfer Assistance: (A) to initiate transfer and slowly descend to recliner with cues for hand placement     Exercise     Balance     End of Session OT - End of Session Activity Tolerance: Patient tolerated treatment well Patient left: in chair;with call bell/phone within reach;with family/visitor present  GO     [name] 08/22/2012, 12:28 PM [phone]

## 2012-08-22 NOTE — Progress Notes (Signed)
Spoke with on call case manager regarding home oxygen for pt at d/c.  Demographics, order for oxygen, and progress note with qualifying saturations have been faxed to [facility].   Nino Glow RN

## 2012-08-22 NOTE — Progress Notes (Signed)
   ELECTROPHYSIOLOGY ROUNDING NOTE    Patient Name: Louis Acosta Date of Encounter: 08-22-2012    SUBJECTIVE:Patient does not feel well this morning.  Feels like his breathing is "tight".  S/p LV lead extraction 08-21-2012  TELEMETRY: Reviewed telemetry pt in sinus rhythm with ventricular pacing Filed Vitals:   08/21/12 1508 08/21/12 1530 08/21/12 2220 08/22/12 0128  BP: 103/68 115/62 110/59 98/76  Pulse: 66 70 72 76  Temp: 98.9 F (37.2 C)  98.4 F (36.9 C) 97.4 F (36.3 C)  TempSrc: Oral  Oral Oral  Resp: 18  18 18   Height:      Weight:      SpO2: 96%  95% 93%    Intake/Output Summary (Last 24 hours) at 08/22/12 0638 Last data filed at 08/22/12 0153  Gross per 24 hour  Intake   1830 ml  Output    675 ml  Net   1155 ml    LABS: Basic Metabolic Panel:  Recent Labs  16/10/96 0515 08/21/12 1355 08/22/12 0510  NA 136  --  131*  K 4.2  --  3.8  CL 92*  --  89*  CO2 36*  --  35*  GLUCOSE 129*  --  119*  BUN 32*  --  30*  CREATININE 1.16 1.05 1.11  CALCIUM 9.8  --  9.5   CBC:  Recent Labs  08/21/12 1355  WBC 8.6  HGB 13.5  HCT 38.5*  MCV 94.6  PLT 84*     Radiology/Studies:  Pending  PHYSICAL EXAM Chronically ill appearing NAD HEENT: Unremarkable Neck:  7 cm JVD, no thyromegally Back:  No CVA tenderness Lungs:  Clear except for scattered wheezes, no increased work of breathing. Incision with minimal drainage. HEART:  Regular rate rhythm, no murmurs, no rubs, no clicks Abd:  Flat, positive bowel sounds, no organomegally, no rebound, no guarding Ext:  2 plus pulses, no edema, no cyanosis, no clubbing Skin:  No rashes no nodules Neuro:  CN II through XII intact, motor grossly intact    Wound care, restrictions reviewed with patient.  Early follow up scheduled.   EP Attending  Patient seen and examined. Agree with above. He is stable this morning and will plan to proceed with DC home. I will see him back in a couple of weeks.  Leonia Reeves.D.

## 2012-08-23 ENCOUNTER — Encounter (HOSPITAL_COMMUNITY): Payer: Self-pay | Admitting: Internal Medicine

## 2012-08-28 ENCOUNTER — Encounter: Payer: Self-pay | Admitting: Internal Medicine

## 2012-08-28 ENCOUNTER — Ambulatory Visit (INDEPENDENT_AMBULATORY_CARE_PROVIDER_SITE_OTHER): Payer: Medicare Other | Admitting: *Deleted

## 2012-08-28 DIAGNOSIS — I5023 Acute on chronic systolic (congestive) heart failure: Secondary | ICD-10-CM

## 2012-08-28 DIAGNOSIS — I5022 Chronic systolic (congestive) heart failure: Secondary | ICD-10-CM

## 2012-08-28 LAB — PACEMAKER DEVICE OBSERVATION
AL AMPLITUDE: 2.9 mv
AL THRESHOLD: 1 V
ATRIAL PACING PM: 15
DEVICE MODEL PM: 2376403
RV LEAD IMPEDENCE PM: 437.5 Ohm
RV LEAD THRESHOLD: 1 V

## 2012-08-28 NOTE — Progress Notes (Signed)
Wound check-PPM in office. 

## 2012-08-28 NOTE — Addendum Note (Signed)
Addended by: Forestine Chute on: 08/28/2012 04:55 PM   Modules accepted: Orders

## 2012-08-29 LAB — BASIC METABOLIC PANEL
BUN: 19 mg/dL (ref 6–23)
Calcium: 9 mg/dL (ref 8.4–10.5)
Creatinine, Ser: 1.5 mg/dL (ref 0.4–1.5)
GFR: 49.06 mL/min — ABNORMAL LOW (ref >60.00)
Glucose, Bld: 153 mg/dL — ABNORMAL HIGH (ref 70–99)

## 2012-08-30 ENCOUNTER — Ambulatory Visit (INDEPENDENT_AMBULATORY_CARE_PROVIDER_SITE_OTHER): Payer: Medicare Other | Admitting: *Deleted

## 2012-08-30 DIAGNOSIS — I10 Essential (primary) hypertension: Secondary | ICD-10-CM

## 2012-09-09 ENCOUNTER — Telehealth: Payer: Self-pay | Admitting: Internal Medicine

## 2012-09-09 NOTE — Telephone Encounter (Signed)
New problem  Pt is having swelling in his feet. Per wife she has him on a low salt diet and pt is staying off his feet like he was advised by the doctor. She wants to know if they should increase his lasix

## 2012-09-09 NOTE — Telephone Encounter (Signed)
Weight up 2 pounds from discharge.  He is taking Furosemide 40mg  1 1/2 tablets bid.  The swelling goes down at night. He is feeling okay but just wanting to make sure that nothing else they should be doing

## 2012-09-10 NOTE — Telephone Encounter (Signed)
Discussed with Dr Ladona Ridgel if his weight stays the same will keep Fluid pill as is, if his weight goes up will increase.  His wife is aware.  Will keep follow up appointment on 7/3

## 2012-09-19 ENCOUNTER — Ambulatory Visit (INDEPENDENT_AMBULATORY_CARE_PROVIDER_SITE_OTHER): Payer: Medicare Other | Admitting: Internal Medicine

## 2012-09-19 ENCOUNTER — Encounter: Payer: Self-pay | Admitting: Internal Medicine

## 2012-09-19 ENCOUNTER — Encounter: Payer: Self-pay | Admitting: *Deleted

## 2012-09-19 VITALS — BP 137/66 | HR 71 | Ht 71.0 in | Wt 221.0 lb

## 2012-09-19 DIAGNOSIS — I509 Heart failure, unspecified: Secondary | ICD-10-CM

## 2012-09-19 DIAGNOSIS — Z950 Presence of cardiac pacemaker: Secondary | ICD-10-CM

## 2012-09-19 DIAGNOSIS — I5022 Chronic systolic (congestive) heart failure: Secondary | ICD-10-CM

## 2012-09-19 LAB — CBC WITH DIFFERENTIAL/PLATELET
Basophils Absolute: 0 10*3/uL (ref 0.0–0.1)
Lymphocytes Relative: 24.8 % (ref 12.0–46.0)
Monocytes Relative: 5.7 % (ref 3.0–12.0)
Platelets: 102 10*3/uL — ABNORMAL LOW (ref 150.0–400.0)
RDW: 13.3 % (ref 11.5–14.6)

## 2012-09-19 LAB — BASIC METABOLIC PANEL
CO2: 42 mEq/L — ABNORMAL HIGH (ref 19–32)
Calcium: 9.5 mg/dL (ref 8.4–10.5)
Chloride: 89 mEq/L — ABNORMAL LOW (ref 96–112)
Sodium: 135 mEq/L (ref 135–145)

## 2012-09-19 LAB — PACEMAKER DEVICE OBSERVATION
AL AMPLITUDE: 2.3 mv
BAMS-0001: 150 {beats}/min
BATTERY VOLTAGE: 2.9178 V
DEVICE MODEL PM: 2376403
RV LEAD AMPLITUDE: 8.4 mv
RV LEAD IMPEDENCE PM: 400 Ohm

## 2012-09-19 LAB — PROTIME-INR
INR: 1.4 ratio — ABNORMAL HIGH (ref 0.8–1.0)
Prothrombin Time: 14.3 s — ABNORMAL HIGH (ref 10.2–12.4)

## 2012-09-19 LAB — APTT: aPTT: 27.2 s (ref 21.7–28.8)

## 2012-09-19 NOTE — Assessment & Plan Note (Signed)
His symptoms are class III. He is instructed to continue his current medical therapy, maintain a low-sodium diet, and remain as active as possible.

## 2012-09-19 NOTE — Patient Instructions (Addendum)
PRE-OP Labs:  Cbc, bmet, ptt, pt/inr

## 2012-09-19 NOTE — Progress Notes (Signed)
HPI Louis Acosta returns today for followup. He is a 79 year old man with a history of complete heart block, chronic systolic dysfunction, chronic systolic heart failure ejection fraction 15%, who developed a isolated right internal jugular skin infection involving the a portion of his pacemaker lead. This was back in several weeks ago. He feels well and is healed up nicely. He denies chest pain. He has chronic class III heart failure symptoms. Mild peripheral edema. No syncope. He is anxious to have his new left ventricular lead placed. He has not had fevers or chills. Allergies  Allergen Reactions  . Clindamycin/Lincomycin Nausea And Vomiting     Current Outpatient Prescriptions  Medication Sig Dispense Refill  . B Complex Vitamins (VITAMIN-B COMPLEX PO) Take 1 tablet by mouth daily.        . brimonidine-timolol (COMBIGAN) 0.2-0.5 % ophthalmic solution Place 1 drop into the right eye every 12 (twelve) hours.      . dorzolamide (TRUSOPT) 2 % ophthalmic solution Place 1 drop into the right eye 2 (two) times daily.       Marland Kitchen [facility] UNILET LANCETS 30G MISC by Does not apply route.        . fish oil-omega-3 fatty acids 1000 MG capsule Take 1 g by mouth daily.       . fluticasone (FLONASE) 50 MCG/ACT nasal spray Place 1 spray into the nose daily as needed for allergies.       . furosemide (LASIX) 40 MG tablet Take 1.5 tablets (60 mg total) by mouth 2 (two) times daily.  180 tablet  3  . Glucose Blood (BAYER BREEZE 2 TEST VI) by In Vitro route 4 (four) times daily.        . insulin lispro (HUMALOG) 100 UNIT/ML injection Inject 10-20 Units into the skin daily as needed for high blood sugar.       Marland Kitchen LANTUS 100 UNIT/ML injection Inject 20-70 Units into the skin at bedtime.       Marland Kitchen loratadine (CLARITIN) 10 MG tablet Take 10 mg by mouth daily as needed for allergies.       Marland Kitchen losartan (COZAAR) 25 MG tablet Take 25 mg by mouth daily.        . metoprolol succinate (TOPROL-XL) 100 MG 24 hr tablet Take 1  tablet (100 mg total) by mouth daily. Take with or immediately following a meal.  30 tablet  4  . Misc Natural Products (OSTEO BI-FLEX TRIPLE STRENGTH PO) Take 1 tablet by mouth daily.       . Multiple Vitamins-Minerals (MULTIVITAMIN WITH MINERALS) tablet Take 1 tablet by mouth daily.        . nitroGLYCERIN (NITROSTAT) 0.4 MG SL tablet Place 0.4 mg under the tongue every 5 (five) minutes as needed for chest pain.       . potassium gluconate 595 MG TABS Take 595 mg by mouth daily.      . Saw Palmetto 450 MG CAPS Take 450 mg by mouth 2 (two) times daily.      . simvastatin (ZOCOR) 40 MG tablet Take 40 mg by mouth every evening.        . travoprost, benzalkonium, (TRAVATAN) 0.004 % ophthalmic solution Place 1 drop into both eyes at bedtime.         No current facility-administered medications for this visit.     Past Medical History  Diagnosis Date  . Cardiomyopathy     broken 3x's  . History of fracture of nose   .  Diabetes mellitus     type 2  . Hyperlipidemia   . Arthritis     ROS:   All systems reviewed and negative except as noted in the HPI.   Past Surgical History  Procedure Laterality Date  . Transthoracic echocardiogram  01/2007  . Pacemaker lead removal N/A 08/21/2012    Procedure: PACEMAKER LEAD REMOVAL;  Surgeon: Marinus Maw, MD;  Location: [facility] OR;  Service: Cardiovascular;  Laterality: N/A;     No family history on file.   History   Social History  . Marital Status: Married    Spouse Name: N/A    Number of Children: N/A  . Years of Education: N/A   Occupational History  . retired    Social History Main Topics  . Smoking status: Former Games developer  . Smokeless tobacco: Not on file  . Alcohol Use: No  . Drug Use: Not on file  . Sexually Active: Not on file   Other Topics Concern  . Not on file   Social History Narrative  . No narrative on file     BP 137/66  Pulse 71  Ht 5\' 11"  (1.803 m)  Wt 221 lb (100.245 kg)  BMI 30.84 kg/m2  Physical  Exam:  stable appearing 79 year old NAD HEENT: Unremarkable Neck:  9 cm JVD, no thyromegally, well-healed incision in the right internal jugular vein region. Back:  No CVA tenderness Lungs:  Clear except for basilar rales. HEART:  Regular rate rhythm, no murmurs, no rubs, no clicks Abd:  soft, positive bowel sounds, no organomegally, no rebound, no guarding Ext:  2 plus pulses, no edema, no cyanosis, no clubbing Skin:  No rashes no nodules Neuro:  CN II through XII intact, motor grossly intact   DEVICE  Normal device function.  See PaceArt for details.  His device has greater than 5 years of battery longevity.  Assess/Plan:

## 2012-09-19 NOTE — Assessment & Plan Note (Signed)
His St. Jude device is stable and working appropriately. We'll plan to schedule insertion of a new left ventricular lead in the next few weeks. I would anticipate doing this by placing the through the right subclavian vein and, in fact the left sided pocket as he has a history of an occluded left subclavian vein.

## 2012-09-24 ENCOUNTER — Encounter (HOSPITAL_COMMUNITY): Payer: Self-pay | Admitting: Pharmacy Technician

## 2012-09-30 ENCOUNTER — Encounter (HOSPITAL_COMMUNITY): Payer: Self-pay | Admitting: General Practice

## 2012-09-30 ENCOUNTER — Ambulatory Visit (HOSPITAL_COMMUNITY)
Admission: RE | Admit: 2012-09-30 | Discharge: 2012-10-01 | Disposition: A | Discharge status: Home / Self Care | Payer: Medicare Other | Source: Ambulatory Visit | Attending: Internal Medicine | Admitting: Internal Medicine

## 2012-09-30 ENCOUNTER — Encounter (HOSPITAL_COMMUNITY)
Admission: RE | Disposition: A | Discharge status: Home / Self Care | Payer: Self-pay | Source: Ambulatory Visit | Attending: Internal Medicine

## 2012-09-30 DIAGNOSIS — I509 Heart failure, unspecified: Secondary | ICD-10-CM | POA: Insufficient documentation

## 2012-09-30 DIAGNOSIS — Z950 Presence of cardiac pacemaker: Secondary | ICD-10-CM | POA: Diagnosis not present

## 2012-09-30 DIAGNOSIS — I428 Other cardiomyopathies: Secondary | ICD-10-CM | POA: Diagnosis not present

## 2012-09-30 DIAGNOSIS — I442 Atrioventricular block, complete: Secondary | ICD-10-CM | POA: Diagnosis not present

## 2012-09-30 DIAGNOSIS — Z79899 Other long term (current) drug therapy: Secondary | ICD-10-CM | POA: Insufficient documentation

## 2012-09-30 DIAGNOSIS — I10 Essential (primary) hypertension: Secondary | ICD-10-CM

## 2012-09-30 DIAGNOSIS — I447 Left bundle-branch block, unspecified: Secondary | ICD-10-CM | POA: Diagnosis not present

## 2012-09-30 DIAGNOSIS — E119 Type 2 diabetes mellitus without complications: Secondary | ICD-10-CM | POA: Insufficient documentation

## 2012-09-30 DIAGNOSIS — I5022 Chronic systolic (congestive) heart failure: Secondary | ICD-10-CM | POA: Insufficient documentation

## 2012-09-30 DIAGNOSIS — E785 Hyperlipidemia, unspecified: Secondary | ICD-10-CM | POA: Diagnosis not present

## 2012-09-30 DIAGNOSIS — I208 Other forms of angina pectoris: Secondary | ICD-10-CM

## 2012-09-30 HISTORY — DX: Heart failure, unspecified: I50.9

## 2012-09-30 HISTORY — DX: Presence of cardiac pacemaker: Z95.0

## 2012-09-30 HISTORY — PX: LEAD REVISION: SHX5945

## 2012-09-30 HISTORY — DX: Cardiac arrhythmia, unspecified: I49.9

## 2012-09-30 HISTORY — DX: Essential (primary) hypertension: I10

## 2012-09-30 LAB — GLUCOSE, CAPILLARY
Glucose-Capillary: 106 mg/dL — ABNORMAL HIGH (ref 70–99)
Glucose-Capillary: 110 mg/dL — ABNORMAL HIGH (ref 70–99)
Glucose-Capillary: 170 mg/dL — ABNORMAL HIGH (ref 70–99)

## 2012-09-30 LAB — SURGICAL PCR SCREEN: MRSA, PCR: NEGATIVE

## 2012-09-30 SURGERY — LEAD REVISION
Anesthesia: LOCAL

## 2012-09-30 MED ORDER — METOPROLOL SUCCINATE ER 100 MG PO TB24
100.0000 mg | ORAL_TABLET | Freq: Every day | ORAL | Status: DC
Start: 2012-09-30 — End: 2012-10-01
  Administered 2012-09-30 – 2012-10-01 (×2): 100 mg via ORAL
  Filled 2012-09-30 (×2): qty 1

## 2012-09-30 MED ORDER — ONDANSETRON HCL 4 MG/2ML IJ SOLN: 4.0000 mg | Freq: Four times a day (QID) | INTRAMUSCULAR | Status: DC | PRN

## 2012-09-30 MED ORDER — MIDAZOLAM HCL 5 MG/5ML IJ SOLN
INTRAMUSCULAR | Status: AC
  Filled 2012-09-30: qty 5

## 2012-09-30 MED ORDER — MUPIROCIN 2 % EX OINT: TOPICAL_OINTMENT | Freq: Two times a day (BID) | CUTANEOUS | Status: DC

## 2012-09-30 MED ORDER — SODIUM CHLORIDE 0.9 % IV SOLN
INTRAVENOUS | Status: DC
  Administered 2012-09-30: 1000 mL via INTRAVENOUS

## 2012-09-30 MED ORDER — INSULIN ASPART 100 UNIT/ML ~~LOC~~ SOLN
0.0000 [IU] | Freq: Three times a day (TID) | SUBCUTANEOUS | Status: DC
  Administered 2012-09-30: 2 [IU] via SUBCUTANEOUS

## 2012-09-30 MED ORDER — NITROGLYCERIN 0.4 MG SL SUBL: 0.4000 mg | SUBLINGUAL_TABLET | SUBLINGUAL | Status: DC | PRN

## 2012-09-30 MED ORDER — ACETAMINOPHEN 325 MG PO TABS: 325.0000 mg | ORAL_TABLET | ORAL | Status: DC | PRN

## 2012-09-30 MED ORDER — FUROSEMIDE 10 MG/ML IJ SOLN
INTRAMUSCULAR | Status: AC
  Administered 2012-09-30: 60 mg via INTRAVENOUS
  Filled 2012-09-30: qty 8

## 2012-09-30 MED ORDER — FENTANYL CITRATE 0.05 MG/ML IJ SOLN
INTRAMUSCULAR | Status: AC
  Filled 2012-09-30: qty 2

## 2012-09-30 MED ORDER — SODIUM CHLORIDE 0.9 % IR SOLN
80.0000 mg | Status: DC
  Filled 2012-09-30: qty 2

## 2012-09-30 MED ORDER — INSULIN ASPART 100 UNIT/ML ~~LOC~~ SOLN: 6.0000 [IU] | Freq: Every morning | SUBCUTANEOUS | Status: DC

## 2012-09-30 MED ORDER — MUPIROCIN 2 % EX OINT
TOPICAL_OINTMENT | CUTANEOUS | Status: AC
  Filled 2012-09-30: qty 22

## 2012-09-30 MED ORDER — LOSARTAN POTASSIUM 25 MG PO TABS
25.0000 mg | ORAL_TABLET | Freq: Every day | ORAL | Status: DC
  Administered 2012-09-30 – 2012-10-01 (×2): 25 mg via ORAL
  Filled 2012-09-30 (×2): qty 1

## 2012-09-30 MED ORDER — CHLORHEXIDINE GLUCONATE 4 % EX LIQD: 60.0000 mL | Freq: Once | CUTANEOUS | Status: DC

## 2012-09-30 MED ORDER — LIDOCAINE HCL (PF) 1 % IJ SOLN
INTRAMUSCULAR | Status: AC
  Filled 2012-09-30: qty 30

## 2012-09-30 MED ORDER — INSULIN ASPART 100 UNIT/ML ~~LOC~~ SOLN: 0.0000 [IU] | Freq: Every day | SUBCUTANEOUS | Status: DC

## 2012-09-30 MED ORDER — INSULIN GLARGINE 100 UNIT/ML ~~LOC~~ SOLN: 20.0000 [IU] | Freq: Every day | SUBCUTANEOUS | Status: DC

## 2012-09-30 MED ORDER — HEPARIN (PORCINE) IN NACL 2-0.9 UNIT/ML-% IJ SOLN
INTRAMUSCULAR | Status: AC
  Filled 2012-09-30: qty 500

## 2012-09-30 MED ORDER — LIDOCAINE HCL (PF) 1 % IJ SOLN
INTRAMUSCULAR | Status: AC
  Filled 2012-09-30: qty 60

## 2012-09-30 MED ORDER — CEFAZOLIN SODIUM-DEXTROSE 2-3 GM-% IV SOLR
2.0000 g | INTRAVENOUS | Status: DC
  Filled 2012-09-30 (×2): qty 50

## 2012-09-30 MED ORDER — FUROSEMIDE 10 MG/ML IJ SOLN: 60.0000 mg | Freq: Once | INTRAMUSCULAR | Status: AC

## 2012-09-30 MED ORDER — CEFAZOLIN SODIUM-DEXTROSE 2-3 GM-% IV SOLR
2.0000 g | Freq: Four times a day (QID) | INTRAVENOUS | Status: AC
  Administered 2012-09-30 – 2012-10-01 (×3): 2 g via INTRAVENOUS
  Filled 2012-09-30 (×3): qty 50

## 2012-09-30 NOTE — Progress Notes (Signed)
Patient received from cath lab, sating 94-95% 3 liters [place], respirations 24, deep, using accesory muscles.  Patient and wife state he is breathing a lot harder than normal, some inspiratory wheezes and breath sounds very diminished.  Ronie Spies PA paged and notified.  Dr. Tenny Craw up to see patient.  60 mg IV lasix ordered given.  Patient insistent on walking to the bathroom to urinate, was able to get patient to agree to use urinal at the bedside.  Will continue to monitor.  [name]

## 2012-09-30 NOTE — H&P (View-Only) (Signed)
HPI [name] returns today for followup. He is a 79-year-old man with a history of complete heart block, chronic systolic dysfunction, chronic systolic heart failure ejection fraction 15%, who developed a isolated right internal jugular skin infection involving the a portion of his pacemaker lead. This was back in several weeks ago. He feels well and is healed up nicely. He denies chest pain. He has chronic class III heart failure symptoms. Mild peripheral edema. No syncope. He is anxious to have his new left ventricular lead placed. He has not had fevers or chills. Allergies  Allergen Reactions  . Clindamycin/Lincomycin Nausea And Vomiting     Current Outpatient Prescriptions  Medication Sig Dispense Refill  . B Complex Vitamins (VITAMIN-B COMPLEX PO) Take 1 tablet by mouth daily.        . brimonidine-timolol (COMBIGAN) 0.2-0.5 % ophthalmic solution Place 1 drop into the right eye every 12 (twelve) hours.      . dorzolamide (TRUSOPT) 2 % ophthalmic solution Place 1 drop into the right eye 2 (two) times daily.       . DRUG MART UNILET LANCETS 30G MISC by Does not apply route.        . fish oil-omega-3 fatty acids 1000 MG capsule Take 1 g by mouth daily.       . fluticasone (FLONASE) 50 MCG/ACT nasal spray Place 1 spray into the nose daily as needed for allergies.       . furosemide (LASIX) 40 MG tablet Take 1.5 tablets (60 mg total) by mouth 2 (two) times daily.  180 tablet  3  . Glucose Blood (BAYER BREEZE 2 TEST VI) by In Vitro route 4 (four) times daily.        . insulin lispro (HUMALOG) 100 UNIT/ML injection Inject 10-20 Units into the skin daily as needed for high blood sugar.       . LANTUS 100 UNIT/ML injection Inject 20-70 Units into the skin at bedtime.       . loratadine (CLARITIN) 10 MG tablet Take 10 mg by mouth daily as needed for allergies.       . losartan (COZAAR) 25 MG tablet Take 25 mg by mouth daily.        . metoprolol succinate (TOPROL-XL) 100 MG 24 hr tablet Take 1  tablet (100 mg total) by mouth daily. Take with or immediately following a meal.  30 tablet  4  . Misc Natural Products (OSTEO BI-FLEX TRIPLE STRENGTH PO) Take 1 tablet by mouth daily.       . Multiple Vitamins-Minerals (MULTIVITAMIN WITH MINERALS) tablet Take 1 tablet by mouth daily.        . nitroGLYCERIN (NITROSTAT) 0.4 MG SL tablet Place 0.4 mg under the tongue every 5 (five) minutes as needed for chest pain.       . potassium gluconate 595 MG TABS Take 595 mg by mouth daily.      . Saw Palmetto 450 MG CAPS Take 450 mg by mouth 2 (two) times daily.      . simvastatin (ZOCOR) 40 MG tablet Take 40 mg by mouth every evening.        . travoprost, benzalkonium, (TRAVATAN) 0.004 % ophthalmic solution Place 1 drop into both eyes at bedtime.         No current facility-administered medications for this visit.     Past Medical History  Diagnosis Date  . Cardiomyopathy     broken 3x's  . History of fracture of nose   .   Diabetes mellitus     type 2  . Hyperlipidemia   . Arthritis     ROS:   All systems reviewed and negative except as noted in the HPI.   Past Surgical History  Procedure Laterality Date  . Transthoracic echocardiogram  01/2007  . Pacemaker lead removal N/A 08/21/2012    Procedure: PACEMAKER LEAD REMOVAL;  Surgeon: Gregg W Taylor, MD;  Location: [facility] OR;  Service: Cardiovascular;  Laterality: N/A;     No family history on file.   History   Social History  . Marital Status: Married    Spouse Name: N/A    Number of Children: N/A  . Years of Education: N/A   Occupational History  . retired    Social History Main Topics  . Smoking status: Former Smoker  . Smokeless tobacco: Not on file  . Alcohol Use: No  . Drug Use: Not on file  . Sexually Active: Not on file   Other Topics Concern  . Not on file   Social History Narrative  . No narrative on file     BP 137/66  Pulse 71  Ht 5' 11" (1.803 m)  Wt 221 lb (100.245 kg)  BMI 30.84 kg/m2  Physical  Exam:  stable appearing 79-year-old NAD HEENT: Unremarkable Neck:  9 cm JVD, no thyromegally, well-healed incision in the right internal jugular vein region. Back:  No CVA tenderness Lungs:  Clear except for basilar rales. HEART:  Regular rate rhythm, no murmurs, no rubs, no clicks Abd:  soft, positive bowel sounds, no organomegally, no rebound, no guarding Ext:  2 plus pulses, no edema, no cyanosis, no clubbing Skin:  No rashes no nodules Neuro:  CN II through XII intact, motor grossly intact   DEVICE  Normal device function.  See PaceArt for details.  His device has greater than 5 years of battery longevity.  Assess/Plan: 

## 2012-09-30 NOTE — Interval H&P Note (Signed)
History and Physical Interval Note: In the interim, the patient continues to be stable and presents today for insertion of a new LV lead with tunneling to the contra-lateral side. I have discussed the risks/benefits/goals/expectations of the procedure with the patient and he wishes to proceed.  09/30/2012 7:09 AM  Billey Gosling Hermine Messick  has presented today for surgery, with the diagnosis of Redo lead  The various methods of treatment have been discussed with the patient and family. After consideration of risks, benefits and other options for treatment, the patient has consented to  Procedure(s): LEAD REVISION (N/A) as a surgical intervention .  The patient's history has been reviewed, patient examined, no change in status, stable for surgery.  I have reviewed the patient's chart and labs.  Questions were answered to the patient's satisfaction.     Leonia Reeves.D.

## 2012-09-30 NOTE — Op Note (Signed)
Attempted LV lead insertion carried out without immediate complication. Full note dictated. Unable to keep the LV lead in place despite multiple attempts with multiple leads and multiple veins. [id number].

## 2012-10-01 ENCOUNTER — Ambulatory Visit (HOSPITAL_COMMUNITY): Payer: Medicare Other

## 2012-10-01 ENCOUNTER — Other Ambulatory Visit: Payer: Self-pay

## 2012-10-01 DIAGNOSIS — I5022 Chronic systolic (congestive) heart failure: Secondary | ICD-10-CM | POA: Diagnosis not present

## 2012-10-01 LAB — GLUCOSE, CAPILLARY: Glucose-Capillary: 109 mg/dL — ABNORMAL HIGH (ref 70–99)

## 2012-10-01 NOTE — Progress Notes (Signed)
Reviewed discharge instructions with patient and he stated his understanding.  Patient discharged via wheelchair and home oxygen tank home with wife.  [name]

## 2012-10-01 NOTE — Op Note (Signed)
NAMEJUNIEL, GROENE              ACCOUNT NO.:  [id number]  MEDICAL RECORD NO.:  [id number]  LOCATION:  [id number]                        FACILITY:  [facility]  PHYSICIAN:  Doylene Canning. Ladona Ridgel, MD    DATE OF BIRTH:  06/23/1931  DATE OF PROCEDURE:  09/30/2012 DATE OF DISCHARGE:                              OPERATIVE REPORT   PROCEDURE PERFORMED:  Attempted insertion of a left ventricular pacing lead utilizing coronary sinus venography.  INTRODUCTION:  The patient is a 79 year old man who developed an infection of his previous right IJ pacemaker LV lead insertion site. This LV lead was extracted and he was allowed to heal up.  He has underlying complete heart block with a dual-chamber pacing.  He has a pacing induced left bundle-branch block with a QRS duration of over 170 milliseconds, and class III heart failure and is now admitted for insertion of a left ventricular lead by way of the right subclavian vein with plans for tunneling across to the ICD pocket.  PROCEDURE:  After informed consent was obtained, the patient was taken to the diagnostic EP lab in a fasting state.  After usual preparation and draping, intravenous fentanyl and midazolam was given for sedation. A 30 mL of lidocaine was infiltrated into the right infraclavicular region.  A 4 cm incision was carried out over this region. Electrocautery was utilized to dissect down to the fascial plane.  The right subclavian vein was then punctured and a guidewire advanced into the right atrium.  A St. Jude Straight guiding catheter was advanced into the right atrium.  A 6-French hexapolar EP catheter was utilized to cannulate the coronary sinus.  Venography of the coronary sinus initially demonstrated a posterior vein which had a 180 degree loop in it.  The St. Jude LV pacing lead (bipolar) was advanced over the angioplasty 0.014 Mailman guidewire into this vein without particular difficulty.  The guiding catheter was liberated from the LV  lead in the usual manner and the lead was secured to the subpectoral fascia with silk suture and the sewing sleeve was secured with silk suture.  At this point, we made plans to towel the lead.  A midline 2 cm incision was made, and a blunt dissection was utilized to dissect subcutaneously from the midline incision all the way to the right infraclavicular pocket. Lidocaine was utilized for analgesia during this portion of the procedure.  The LV lead was pulled through to the midline and we then decided to check the lead and found that the LV threshold had increased from less than a V to around 2 V.  Fluoroscopy of the LV lead demonstrated that the lead had pulled back almost to the coronary sinus. It was deemed at this point that the likelihood of keeping the LV lead in this vein was unlikely and would result in a complete lead dislodgement.  At this point, another guiding catheter was advanced into the right atrium over an angioplasty guidewire and the coronary sinus was recannulated and venography was carried out.  It should be noted that there was a large valve present impeding progress of our guiding catheter.  Ultimately, this valve was traversed utilizing a dilator and a [employer]  wire and additional venography was carried out.  This demonstrated a lateral vein which was acceptable for LV lead placement but was tortuous.  The 0.014 angioplasty guidewire was advanced into this vein and the initial St. Jude lead was advanced into the vein, however, after approximately 5 minutes, this St. Jude bipolar lead withdrew approximately halfway from base to apex, and it too was deemed unreliable for stable position.  As to why the patient continued to have retraction of his LV lead was unclear.  We then tried a Medtronic model 4194 LV pacing lead and the initial guidewire dislodged.  A new Luge 0.014 guidewire was then advanced and a smaller Medtronic model 4196 LV lead was advanced into the  lateral vein.  In this location, the threshold was approximately 2 V.  Unfortunately, the lead stability remained a problem and I was concerned at this point that this lead was also dislodged and for this reason, I let the guidewire remain in place and we attempted to advance the larger Medtronic model 4194 lead over the guidewire and into the lateral vein which was unsuccessful.  The vein was too small for the lead to get through.  Attempts then to go back in with the smaller 4196 lead were unsuccessful as the vein os became occluded.  At this point, we tried once again to get into the posterior vein, which had a 180-degree turn and were unsuccessful.  With approximately 75 minutes of fluoro time and over 3 hours of procedure time, it was deemed most appropriate at this point to abandon the procedure, and the Guidant lead was removed and hemostasis was obtained.  The right side of the pocket was irrigated as was the midline pocket, and the incision was closed with 2-0 and 3-0 Vicryl.  Benzoin and Steri-Strips were painted on the skin.  A pressure dressing was applied.  The patient was returned to his room in stable condition.  COMPLICATIONS:  There were no immediate procedure complications.  RESULTS:  This demonstrates ultimately unsuccessful attempt at placement of a new LV pacing lead in a patient with left bundle-branch block, and complete heart block, and severe left ventricular dysfunction with an ejection fraction of 25%.  The patient will be referred to the cardiac surgeons for possible epicardial lead placement.     Doylene Canning. Ladona Ridgel, MD     GWT/MEDQ  D:  09/30/2012  T:  10/01/2012  Job:  [id number]  cc:   Evelene Croon, M.D.

## 2012-10-01 NOTE — Discharge Summary (Signed)
ELECTROPHYSIOLOGY PROCEDURE DISCHARGE SUMMARY    Patient ID: Louis Acosta,  MRN: [id number], DOB/AGE: 26-Dec-1931 79 y.o.  Admit date: 09/30/2012 Discharge date: 10/01/2012  Primary Care Physician: Marden Noble, MD Primary Cardiologist: Lewayne Bunting, MD  Primary Discharge Diagnosis:  Non ischemic cardiomyopathy and congestive heart failure s/p attempted placement of an LV lead this admission  Secondary Discharge Diagnosis:  1.  Complete heart block s/p PPM implant 2.  Diabetes 3.  Hyperlipidemia  Procedures This Admission:  1.  Attempted placement of an LV lead on 09-30-2012.  Because of poor vein targets and the vein becoming occluded, LV lead placement was unsuccessful.  The patient had no early apparent complications.   Brief HPI: The patient is a 79 year old man who developed an infection of his previous right IJ pacemaker LV lead insertion site. This LV lead was extracted and he was allowed to heal up. He has underlying complete heart block with a dual-chamber pacing. He has a pacing induced left bundle-branch block with a QRS duration of over 170 milliseconds, and class III heart failure and is now admitted for insertion of a left ventricular lead by way of the right subclavian vein with plans for tunneling across to the ICD pocket.  Hospital Course:  The patient was admitted and underwent attempted placement of an LV lead by Dr Ladona Ridgel.  See his op note for full details.  This was unsuccessful.  The patient was monitored on telemetry overnight which demonstrated P synchronous ventricular pacing.  His incisions were without complication.  Dr Ladona Ridgel examined the patient and considered him stable for discharge to home. We will arrange consultation with [facility] to discuss possible epicardial LV lead placement.   Discharge Vitals: Blood pressure 114/60, pulse 68, temperature 97.8 F (36.6 C), temperature source Oral, resp. rate 24, height 5\' 10"  (1.778 m), weight 224 lb 9.6 oz  (101.878 kg), SpO2 95.00%.   Labs:   Lab Results  Component Value Date   WBC 6.1 09/19/2012   HGB 12.4* 09/19/2012   HCT 35.8* 09/19/2012   MCV 98.6 09/19/2012   PLT 102.0* 09/19/2012     Discharge Medications:    Medication List         BAYER BREEZE 2 TEST VI  by In Vitro route 4 (four) times daily.     COMBIGAN 0.2-0.5 % ophthalmic solution  Generic drug:  brimonidine-timolol  Place 1 drop into the right eye every 12 (twelve) hours.     dorzolamide 2 % ophthalmic solution  Commonly known as:  TRUSOPT  Place 1 drop into the right eye 2 (two) times daily.     DRUG MART UNILET LANCETS 30G Misc  by Does not apply route.     fish oil-omega-3 fatty acids 1000 MG capsule  Take 1 g by mouth daily.     fluticasone 50 MCG/ACT nasal spray  Commonly known as:  FLONASE  Place 1 spray into the nose daily as needed for allergies.     furosemide 40 MG tablet  Commonly known as:  LASIX  Take 1.5 tablets (60 mg total) by mouth 2 (two) times daily.     insulin lispro 100 UNIT/ML injection  Commonly known as:  HUMALOG  Inject 10-20 Units into the skin daily as needed for high blood sugar.     LANTUS 100 UNIT/ML injection  Generic drug:  insulin glargine  Inject 20-70 Units into the skin at bedtime.     loratadine 10 MG tablet  Commonly known as:  CLARITIN  Take 10 mg by mouth daily as needed for allergies.     losartan 25 MG tablet  Commonly known as:  COZAAR  Take 25 mg by mouth daily.     metoprolol succinate 100 MG 24 hr tablet  Commonly known as:  TOPROL-XL  Take 1 tablet (100 mg total) by mouth daily. Take with or immediately following a meal.     multivitamin with minerals tablet  Take 1 tablet by mouth daily.     nitroGLYCERIN 0.4 MG SL tablet  Commonly known as:  NITROSTAT  Place 0.4 mg under the tongue every 5 (five) minutes as needed for chest pain.     OSTEO BI-FLEX TRIPLE STRENGTH PO  Take 1 tablet by mouth daily.     potassium gluconate 595 MG Tabs  Take 595  mg by mouth daily.     Saw Palmetto 450 MG Caps  Take 450 mg by mouth 2 (two) times daily.     simvastatin 40 MG tablet  Commonly known as:  ZOCOR  Take 40 mg by mouth every evening.     SYSTANE ULTRA OP  Place 1 drop into the left eye every morning.     travoprost (benzalkonium) 0.004 % ophthalmic solution  Commonly known as:  TRAVATAN  Place 1 drop into both eyes at bedtime.     VITAMIN-B COMPLEX PO  Take 1 tablet by mouth daily.        Disposition:  Discharge Orders   Future Appointments Provider Department Dept Phone   10/09/2012 10:30 AM [facility] Main Office [place]) [phone]   Future Orders Complete By Expires     Diet - low sodium heart healthy  As directed     Increase activity slowly  As directed       Follow-up Information   Follow up with [facility] On 10/09/2012. (At 10:30 AM for wound check)    Contact information:   [address] [place] [address] [phone]      Follow up with [facility]. (Their office will call you with your appt date and time)    Contact information:   [address]  [address] [place] [address]       Duration of Discharge Encounter: Greater than 30 minutes including physician time.  Signed, Gypsy Balsam, RN, BSN 10/01/2012, 11:30 AM

## 2012-10-01 NOTE — Progress Notes (Signed)
   ELECTROPHYSIOLOGY ROUNDING NOTE    Patient Name: Louis Acosta Date of Encounter: 10/01/2012    SUBJECTIVE:Patient feels well this morning.  No chest pain.  Shortness of breath stable.  Minimal incisional discomfort.  S/p attempted LV pacing lead insertion 09-30-2012 with LV lead ejected three times before abandoning procedure.  Plan for patient to be referred to [facility] for epicardial LV lead placement  TELEMETRY: Reviewed telemetry pt ventricular pacing Filed Vitals:   09/30/12 1800 09/30/12 1900 09/30/12 2210 10/01/12 0447  BP: 121/79 127/84 109/55 114/60  Pulse: 75 76 67 68  Temp:   98.5 F (36.9 C) 97.8 F (36.6 C)  TempSrc:      Resp:      Height:      Weight:    224 lb 9.6 oz (101.878 kg)  SpO2: 91% 92% 93% 95%    Intake/Output Summary (Last 24 hours) at 10/01/12 9528 Last data filed at 10/01/12 0450  Gross per 24 hour  Intake    210 ml  Output   2001 ml  Net  -1791 ml    CURRENT MEDICATIONS: . insulin aspart  0-15 Units Subcutaneous TID WC  . insulin aspart  0-5 Units Subcutaneous QHS  . losartan  25 mg Oral Daily  . metoprolol succinate  100 mg Oral Daily      Radiology/Studies:  Pending  PHYSICAL EXAM Midline and right chest incision without hematoma  A/P 1. CHB 2. S/p BiV PPM with subsequent infection of LV lead which had been previously place in right IJ and tunneled. 3. S/p prior LV lead extraction, now DDD pacing 4. S/p attempted re-insertion of a new LV lead, unable to maintain lead stability despite utilizing multiple leads, multiple veins, and multiple guide wires; I suspect above due to worsening TR from RV dysynchronous pacing. Will dc home today on current meds and plan to refer to Dr. Laneta Simmers for consideration of epicardial LV lead insertion.  Wound care, restrictions reviewed with patient.  Will have [facility] call patient later today or tomorrow to schedule appt to discuss epicardial LV lead placement.   Leonia Reeves.D.

## 2012-10-03 ENCOUNTER — Encounter: Payer: Self-pay | Admitting: Surgery

## 2012-10-03 ENCOUNTER — Ambulatory Visit (INDEPENDENT_AMBULATORY_CARE_PROVIDER_SITE_OTHER): Payer: Medicare Other | Admitting: Surgery

## 2012-10-03 VITALS — BP 127/81 | HR 79 | Resp 20 | Ht 70.0 in | Wt 224.0 lb

## 2012-10-03 DIAGNOSIS — I5022 Chronic systolic (congestive) heart failure: Secondary | ICD-10-CM

## 2012-10-03 DIAGNOSIS — Z8679 Personal history of other diseases of the circulatory system: Secondary | ICD-10-CM

## 2012-10-04 ENCOUNTER — Encounter: Payer: Self-pay | Admitting: Surgery

## 2012-10-04 ENCOUNTER — Other Ambulatory Visit: Payer: Self-pay | Admitting: *Deleted

## 2012-10-04 DIAGNOSIS — I519 Heart disease, unspecified: Secondary | ICD-10-CM

## 2012-10-04 NOTE — Progress Notes (Signed)
[address]       [name] [address]             [phone]        PCP is Pearla Dubonnet, MD Referring Provider is Marinus Maw, MD  Chief Complaint  Patient presents with  . Congestive Heart Failure    Discuss Epicardial LV Lead Placement, attemped insertion of left ventricular pacing lead on 09/30/12- Dr Ladona Ridgel    HPI:  The patient is a 79 year old gentleman with a long-standing history of non-ischemic cardiomyopathy, complete heart block and chronic systolic heart failure who had a biventricular pacer insertion around 2004. It was upgraded to a BiV ICD in 2008. In 01/2009 he had the old BiV ICD generator removed and a new LV lead placed via the right IJ vein and tunneled over to the left sided generator which was changed out to a BiV pacer due to reaching the ERI and having an elevated RV defib threshold. 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 In May of this year he presented with an infection in the LV lead pocket in the area lateral to the right IJV. He was treated with a course of antibiotics with continued drainage requiring removal of the LV lead on 08/21/2012. Then on 09/30/2012 he had an attemp at insertion of a new LV lead via the right subclavian vein but it was not possible to keep the lead in satisfactory position in the cardiac veins to allow pacing. Therefore the lead was removed. He says that biventricular pacing has improved his shortness of breath and exertional tolerance and he would like to return to that state if possible.  Past Medical History  Diagnosis Date  . Cardiomyopathy     broken 3x's  . History of fracture of nose   . Diabetes mellitus     type 2  . Hyperlipidemia   . Arthritis   . Hypertension   . Dysrhythmia     HX OF COMPLETE HEART BLOCK  . CHF (congestive heart failure)   . Pacemaker     Past Surgical History  Procedure Laterality Date  .  Transthoracic echocardiogram  01/2007  . Pacemaker lead removal N/A 08/21/2012    Procedure: PACEMAKER LEAD REMOVAL;  Surgeon: Marinus Maw, MD;  Location: [facility] OR;  Service: Cardiovascular;  Laterality: N/A;  . Back surgery    . Knee surgery      History reviewed. No pertinent family history.  Social History History  Substance Use Topics  . Smoking status: Former Smoker    Quit date: 03/20/1968  . Smokeless tobacco: Never Used  . Alcohol Use: No    Current Outpatient Prescriptions  Medication Sig Dispense Refill  . B Complex Vitamins (VITAMIN-B COMPLEX PO) Take 1 tablet by mouth daily.        . brimonidine-timolol (COMBIGAN) 0.2-0.5 % ophthalmic solution Place 1 drop into the right eye every 12 (twelve) hours.      . dorzolamide (TRUSOPT) 2 % ophthalmic solution Place 1 drop into the right eye 2 (two) times daily.       Marland Kitchen [employer] [facility] UNILET LANCETS 30G MISC by Does not apply route.        . fish oil-omega-3 fatty acids 1000 MG capsule Take 1 g by mouth daily.       . fluticasone (FLONASE) 50 MCG/ACT nasal spray Place 1 spray  into the nose daily as needed for allergies.       . furosemide (LASIX) 40 MG tablet Take 1.5 tablets (60 mg total) by mouth 2 (two) times daily.  180 tablet  3  . Glucose Blood (BAYER BREEZE 2 TEST VI) by In Vitro route 4 (four) times daily.        . insulin lispro (HUMALOG) 100 UNIT/ML injection Inject 10-20 Units into the skin daily as needed for high blood sugar.       Marland Kitchen LANTUS 100 UNIT/ML injection Inject 20-70 Units into the skin at bedtime.       Marland Kitchen loratadine (CLARITIN) 10 MG tablet Take 10 mg by mouth daily as needed for allergies.       Marland Kitchen losartan (COZAAR) 25 MG tablet Take 25 mg by mouth daily.        . metoprolol succinate (TOPROL-XL) 100 MG 24 hr tablet Take 1 tablet (100 mg total) by mouth daily. Take with or immediately following a meal.  30 tablet  4  . Misc Natural Products (OSTEO BI-FLEX TRIPLE STRENGTH PO) Take 1 tablet by mouth daily.       .  Multiple Vitamins-Minerals (MULTIVITAMIN WITH MINERALS) tablet Take 1 tablet by mouth daily.        . nitroGLYCERIN (NITROSTAT) 0.4 MG SL tablet Place 0.4 mg under the tongue every 5 (five) minutes as needed for chest pain.       Bertram Gala Glycol-Propyl Glycol (SYSTANE ULTRA OP) Place 1 drop into the left eye every morning.       . potassium gluconate 595 MG TABS Take 595 mg by mouth daily.      . Saw Palmetto 450 MG CAPS Take 450 mg by mouth 2 (two) times daily.      . simvastatin (ZOCOR) 40 MG tablet Take 40 mg by mouth every evening.        . travoprost, benzalkonium, (TRAVATAN) 0.004 % ophthalmic solution Place 1 drop into both eyes at bedtime.         No current facility-administered medications for this visit.    Allergies  Allergen Reactions  . Clindamycin/Lincomycin Nausea And Vomiting    Review of Systems  Constitutional: Positive for activity change and fatigue. Negative for fever, chills and appetite change.  HENT: Negative.   Eyes: Negative.   Respiratory: Positive for shortness of breath. Negative for chest tightness.   Cardiovascular: Negative for chest pain, palpitations and leg swelling.  Endocrine: Negative.   Genitourinary: Negative.   Musculoskeletal: Negative.   Allergic/Immunologic: Negative.   Neurological: Negative.   Hematological: Negative.   Psychiatric/Behavioral: Negative.     BP 127/81  Pulse 79  Resp 20  Ht 5\' 10"  (1.778 m)  Wt 224 lb (101.606 kg)  BMI 32.14 kg/m2  SpO2 94% Physical Exam  Constitutional: He is oriented to person, place, and time.  Elderly, chronically ill-appearing gentleman in no distress wearing oxygen.  HENT:  Head: Normocephalic and atraumatic.  Mouth/Throat: Oropharynx is clear and moist.  Eyes: EOM are normal. Pupils are equal, round, and reactive to light.  Neck: Normal range of motion. Neck supple. JVD present.  Cardiovascular: Normal rate, regular rhythm, normal heart sounds and intact distal pulses.   No murmur  heard. Pulmonary/Chest: Effort normal and breath sounds normal. No respiratory distress. He has no wheezes. He has no rales.  Recent surgical wound right subclavicular region with an occlusive dressing in place.   There is an incision in the midline over the  upper sternum that has slight erythema next to it.   The BiV generator is in a left subclavicular pocket that looks ok.  Abdominal: Soft. Bowel sounds are normal. He exhibits no distension and no mass. There is no tenderness.  Musculoskeletal: Normal range of motion. He exhibits no edema.  Neurological: He is alert and oriented to person, place, and time. He has normal strength. No cranial nerve deficit or sensory deficit.  Skin: Skin is warm and dry.  Psychiatric: He has a normal mood and affect.     Diagnostic Tests:  *RADIOLOGY REPORT*  Clinical Data: 78 year old male with chest pain. Attempted left  ventricular lead insertion.  CHEST - 2 VIEW  Comparison: 08/22/2012 and earlier.  Findings: Semi upright AP and lateral views of the chest. Left  chest cardiac AICD device with multiple transvenous leads. The RV  apex is not included on the AP view. On the lateral view, on the  lateral leads configuration appears stable. Stable cardiac size  and mediastinal contours.  No pneumothorax identified. Increased left lung base atelectasis.  Increased pulmonary vascularity without overt edema. No definite  effusion. No acute osseous abnormality identified.  IMPRESSION:  1. Increased retrocardiac atelectasis. No pneumothorax or  definite effusion.  2. Left chest AICD lead appears stable in configuration on the  lateral view. The RV apex is not entirely included on the AP view.  Original Report Authenticated By: Erskine Speed, M.D.   Impression:  He has severe NICM with complete heart block and chronic systolic heart failure with significant symptom improvement with BiV pacing. He is now without an LV lead and it could not be inserted  percutaneously. I think it would be worth inserting an LV epicardial lead to improve his quality of life. I would like to wait a few more weeks to be sure that the incisions heal without any signs of infection before inserting the LV epicardial leads and exposing the generator again. I discussed the procedure with the patient and his wife including alternatives, benefits and risks including but not limited to bleeding, infection, injury to the heart or lung, and malfunction of the system. He understands and agrees to proceed.  Plan:  I will plan to do this at the end of July. I will see him back in the office the day before to be sure that the prior wounds looks ok.

## 2012-10-09 ENCOUNTER — Ambulatory Visit (INDEPENDENT_AMBULATORY_CARE_PROVIDER_SITE_OTHER): Payer: Medicare Other | Admitting: *Deleted

## 2012-10-09 DIAGNOSIS — Z950 Presence of cardiac pacemaker: Secondary | ICD-10-CM

## 2012-10-09 LAB — PACEMAKER DEVICE OBSERVATION: DEVICE MODEL PM: 2376403

## 2012-10-09 NOTE — Progress Notes (Signed)
Pt seen in device clinic for follow up of recently attempted LV lead revision.  Wound well healed.  No redness, swelling, or edema. Normal, mild bruising.  Steri-strips removed today.   No device interrogation today, see PaceArt for full details.  F/u tbd after pt sees Dr. Laneta Simmers for LV lead reimplantation. Tentatively [facility] 01/13/13 & ROV w/ Dr. Ladona Ridgel July/2015.   Louis Acosta 10/09/2012 11:38 AM

## 2012-10-14 ENCOUNTER — Telehealth: Payer: Self-pay | Admitting: Internal Medicine

## 2012-10-14 NOTE — Telephone Encounter (Signed)
New prob  Pt wife states husband is having bad dreams, his feet are swelling and he seems to be losing his memory at times. She believes it is because of the TOPROL-XL.

## 2012-10-14 NOTE — Telephone Encounter (Signed)
Since that time patient has shakiness, nausea, and memory problems. Pt's wife feels these are due to Toprol. She states patient had been prescribed Toprol in the past and had similar symptoms. She states patient had been changed from Toprol to Lpressor in the past because of this and he tolerated Lopressor OK, wife is asking if Toprol can be changed due to these side effects.  She also mentioned pt had swelling in his feet and ankles over the weekend but they are better today. Pt is avoiding NA and keeping feet and legs elevated. Pt denies increase in SOB. I will forward to Dr Ladona Ridgel for review.

## 2012-10-14 NOTE — Telephone Encounter (Signed)
She states patient was prescribed Toprol at discharge from the hospital 08/22/12.

## 2012-10-14 NOTE — Telephone Encounter (Signed)
Spoke with patient's wife, Talbert Forest. She states she feels patient is having side effects from Toprol.

## 2012-10-16 MED ORDER — METOPROLOL TARTRATE 50 MG PO TABS: 50.0000 mg | ORAL_TABLET | Freq: Two times a day (BID) | ORAL | Status: DC

## 2012-10-16 NOTE — Telephone Encounter (Signed)
Discussed with Dr Ladona Ridgel.  Okay to switch to Lopressor 50mg  bid.  Wife aware and medication called in

## 2012-10-17 ENCOUNTER — Encounter (HOSPITAL_COMMUNITY): Payer: Self-pay | Admitting: Pharmacy Technician

## 2012-10-22 NOTE — Pre-Procedure Instructions (Signed)
CAPTAIN BLUCHER  10/22/2012   Your procedure is scheduled on:  Thurs, Aug 7 @ 7:30 AM  Report to [facility] at 5:30 AM.  Call this number if you have problems the morning of surgery: [phone]   Remember:   Do not eat food or drink liquids after midnight.   Take these medicines the morning of surgery with A SIP OF WATER: Eye Drops,Flonase(Fluticasone-if needed),and Metoprolol(Lopressor)               No Goody's,BC's,Aleve,Ibuprofen,Fish Oil,or any Herbal Medications   Do not wear jewelry  Do not wear lotions, powders, orcolognes. You may wear deodorant.  Men may shave face and neck.  Do not bring valuables to the hospital.  [facility] is not responsible                   for any belongings or valuables.  Contacts, dentures or bridgework may not be worn into surgery.  Leave suitcase in the car. After surgery it may be brought to your room.  For patients admitted to the hospital, checkout time is 11:00 AM the day of  discharge.     Special Instructions: Shower using CHG 2 nights before surgery and the night before surgery.  If you shower the day of surgery use CHG.  Use special wash - you have one bottle of CHG for all showers.  You should use approximately 1/3 of the bottle for each shower.   Please read over the following fact sheets that you were given: Pain Booklet, Coughing and Deep Breathing, Blood Transfusion Information, MRSA Information and Surgical Site Infection Prevention

## 2012-10-23 ENCOUNTER — Encounter: Payer: Self-pay | Admitting: Surgery

## 2012-10-23 ENCOUNTER — Ambulatory Visit: Payer: Medicare Other | Admitting: Surgery

## 2012-10-23 ENCOUNTER — Encounter (HOSPITAL_COMMUNITY)
Admission: RE | Admit: 2012-10-23 | Discharge: 2012-10-23 | Disposition: A | Discharge status: Home / Self Care | Payer: Medicare Other | Source: Ambulatory Visit | Attending: Surgery | Admitting: Surgery

## 2012-10-23 ENCOUNTER — Encounter (HOSPITAL_COMMUNITY): Payer: Self-pay

## 2012-10-23 ENCOUNTER — Other Ambulatory Visit: Payer: Self-pay

## 2012-10-23 ENCOUNTER — Ambulatory Visit (INDEPENDENT_AMBULATORY_CARE_PROVIDER_SITE_OTHER): Payer: Medicare Other | Admitting: Surgery

## 2012-10-23 VITALS — BP 107/68 | HR 76 | Temp 97.9°F | Resp 20 | Ht 69.0 in | Wt 222.0 lb

## 2012-10-23 VITALS — BP 120/71 | HR 82 | Resp 16 | Ht 69.0 in | Wt 222.0 lb

## 2012-10-23 DIAGNOSIS — I5022 Chronic systolic (congestive) heart failure: Secondary | ICD-10-CM

## 2012-10-23 DIAGNOSIS — Z5189 Encounter for other specified aftercare: Secondary | ICD-10-CM

## 2012-10-23 DIAGNOSIS — Z8679 Personal history of other diseases of the circulatory system: Secondary | ICD-10-CM

## 2012-10-23 DIAGNOSIS — T827XXD Infection and inflammatory reaction due to other cardiac and vascular devices, implants and grafts, subsequent encounter: Secondary | ICD-10-CM

## 2012-10-23 DIAGNOSIS — I519 Heart disease, unspecified: Secondary | ICD-10-CM

## 2012-10-23 HISTORY — DX: Personal history of other diseases of the digestive system: Z87.19

## 2012-10-23 HISTORY — DX: Pain in unspecified joint: M25.50

## 2012-10-23 HISTORY — DX: Angina pectoris, unspecified: I20.9

## 2012-10-23 HISTORY — DX: Disorientation, unspecified: R41.0

## 2012-10-23 HISTORY — DX: Other specified postprocedural states: Z98.890

## 2012-10-23 HISTORY — DX: Benign prostatic hyperplasia without lower urinary tract symptoms: N40.0

## 2012-10-23 HISTORY — DX: Unspecified glaucoma: H40.9

## 2012-10-23 HISTORY — DX: Personal history of other infectious and parasitic diseases: Z86.19

## 2012-10-23 HISTORY — DX: Spontaneous ecchymoses: R23.3

## 2012-10-23 HISTORY — DX: Dorsalgia, unspecified: M54.9

## 2012-10-23 HISTORY — DX: Other chronic pain: G89.29

## 2012-10-23 HISTORY — DX: Other seasonal allergic rhinitis: J30.2

## 2012-10-23 HISTORY — DX: Shortness of breath: R06.02

## 2012-10-23 HISTORY — DX: Personal history of colon polyps, unspecified: Z86.0100

## 2012-10-23 HISTORY — DX: Nocturia: R35.1

## 2012-10-23 HISTORY — DX: Nausea with vomiting, unspecified: R11.2

## 2012-10-23 HISTORY — DX: Diverticulosis of intestine, part unspecified, without perforation or abscess without bleeding: K57.90

## 2012-10-23 HISTORY — DX: Personal history of colonic polyps: Z86.010

## 2012-10-23 LAB — SURGICAL PCR SCREEN: Staphylococcus aureus: NEGATIVE

## 2012-10-23 LAB — BLOOD GAS, ARTERIAL
Acid-Base Excess: 8.4 mmol/L — ABNORMAL HIGH (ref 0.0–2.0)
Drawn by: 206361
O2 Content: 3 L/min
Patient temperature: 98.6
TCO2: 35 mmol/L (ref 0–100)

## 2012-10-23 LAB — URINALYSIS, ROUTINE W REFLEX MICROSCOPIC
Bilirubin Urine: NEGATIVE
Ketones, ur: NEGATIVE mg/dL
Nitrite: NEGATIVE
Protein, ur: NEGATIVE mg/dL
pH: 7 (ref 5.0–8.0)

## 2012-10-23 LAB — CBC
HCT: 30.9 % — ABNORMAL LOW (ref 39.0–52.0)
MCH: 33.5 pg (ref 26.0–34.0)
MCHC: 34.6 g/dL (ref 30.0–36.0)
MCV: 96.9 fL (ref 78.0–100.0)
Platelets: 85 10*3/uL — ABNORMAL LOW (ref 150–400)
RDW: 13 % (ref 11.5–15.5)

## 2012-10-23 LAB — TYPE AND SCREEN
ABO/RH(D): B POS
Antibody Screen: NEGATIVE

## 2012-10-23 LAB — COMPREHENSIVE METABOLIC PANEL
Albumin: 3.7 g/dL (ref 3.5–5.2)
BUN: 16 mg/dL (ref 6–23)
Calcium: 9.3 mg/dL (ref 8.4–10.5)
Creatinine, Ser: 0.87 mg/dL (ref 0.50–1.35)
Total Bilirubin: 0.6 mg/dL (ref 0.3–1.2)
Total Protein: 6.9 g/dL (ref 6.0–8.3)

## 2012-10-23 LAB — APTT: aPTT: 28 seconds (ref 24–37)

## 2012-10-23 LAB — PROTIME-INR
INR: 1.22 (ref 0.00–1.49)
Prothrombin Time: 15.1 seconds (ref 11.6–15.2)

## 2012-10-23 NOTE — Progress Notes (Addendum)
Cardiologist is Dr.Varanasi with [facility]-to request last office visit   Stress test done about 41yrs ago   Echo reports in epic from 2008 and 2014  Heart cath report in epic from 2004  Medical MD is Dr.Robert Kevan Ny

## 2012-10-23 NOTE — Progress Notes (Signed)
Anesthesia PAT Evaluation:  Patient is a 79 year old male scheduled for left thoracotomy for epicardial pacing lead placement on 10/24/12 by Dr. Laneta Simmers.  History includes non-ischemic CM, complete heart block, chronic systolic CHF, DM2, HLD, HTN, former smoker, hiatal hernia, glaucoma, BPH, diverticulosis, post-operative N/V, right TKA, back surgery X 2, cataract extraction.  He underwent placement of BiV PPM in 2004 and upgraded to BiV ICD 2008. In 01/2009 he had the old BiV ICD generator removed and a new LV lead placed via the right IJ vein and tunneled over to the left sided generator which was changed out to a BiV pacer due to reaching the ERI and having an elevated RV defib threshold. 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 In May of this year he presented with an infection in the LV lead pocket in the area lateral to the right IJV. He was treated with a course of antibiotics with continued drainage requiring removal of the LV lead on 08/21/2012. Then on 09/30/2012 he had an attempt at insertion of a new LV lead via the right subclavian vein but it was not possible to keep the lead in satisfactory position in the cardiac veins to allow pacing. Therefore the lead was removed. He had significant improvement with his symptoms with BiV pacing, so reinserting a LV epicardial lead has been recommended to improve his quality of life. He has required home O2 at 3L/[place] since June 2014.  PCP is Dr. Johnnette Barrios.  He is scheduled to see Dr. Laneta Simmers at [place] today.  I was asked to see patient today during his PAT appointment due to history of Nitro SL two weeks ago.  Patient reports he has had to take intermittent Nitro for at least 2-3 years now.  He describes a 4-5/10 mid-left sharp, non-radiating chest pain.  He is unsure if it is related to meals.  As a precaution, he takes Nitro at onset and notices relief within seconds.  He gets intermittent  palpitations that are not associated with any pain.  Two years ago he had associated diaphoresis, but otherwise he has no radiating pain, diaphoresis, increased SOB, presyncope.  He denies exertional chest pain within the past several years, but has not been very active for the past few months due to increasing dyspnea following LV lead removal.  He has had mild LE edema since then as well.  He denies SOB at rest, and can walk around his house without significant dyspnea as long as he is wearing his O2.  There is no current chest pain.  Exam shows a pleasant Caucasian male in NAD, sitting in a wheelchair.  No conversational dyspnea.  Lungs clear.  Heart RRR, but distant (more so on the right.  Up to 1+ LE pre-tibial edema.    Primary cardiologist is Dr. Eldridge Dace, notes still pending.  EP Cardiologist is Dr. Ladona Ridgel.    EKG on 10/23/12 showed atrial sensed, V-paced rhythm. No significant change from previous EKG.  Echo on 08/19/12 showed: - Procedure narrative: Transthoracic echocardiography. Image quality was adequate. The study was technically difficult. Intravenous contrast (Definity) was administered. - Left ventricle: The cavity size was severely dilated. Wall thickness was increased in a pattern of mild LVH. The estimated ejection fraction was 15%. There is akinesis of the entireinferior myocardium. Doppler parameters are consistent with high ventricular filling pressure. - Left atrium: The atrium was severely dilated.  His last cardiac cath  noted was from 01/22/03 and showed 20-30% LAD with luminal irregularities. EF 15 % by echo.  Non-ischemic cardiomyopathy.  CXR on 10/23/12 showed chronic cardiomegaly.Transvenous defibrillator wires and pacing wires in place. Slight increased pulmonary vascular prominence.  Preoperative labs noted.  PLT 85K, previously 77-102K since 07/2012.  H/H 10.7/30.9.  PT/PTT WNL.  Glucose 251. (He reports home glucose readings typically 70's - low 200's.)  Earlier  today, I sent Dr. Laneta Simmers a staff message about patient's Nitro use and thrombocytopenia.  I also spoke with his nurse Jolene.  Patient reports what sounds like intermittent, chronic chest pain that has been going on for at least 2-3 years.  He feels that his symptoms have not progressed in severity or frequency.  Since he is being evaluated by Dr. Laneta Simmers today, I will let him speak further with patient's cardiologists if any concerns.  [facility] reports that [employer] rep will be present for procedure.  I have also updated anesthesiologist Dr. Randa Evens.  Velna Ochs [facility]/Anesthesiology Phone [phone] 10/23/2012 2:05 PM

## 2012-10-23 NOTE — Progress Notes (Signed)
[address]       [name] [address]             [phone]        HPI:  Louis Acosta returns for examination of his wounds prior to planned left thoracotomy for insertion of LV epicardial pacing leads for revision of his biventricular pacing system. He remains short of breath with activity. He denies any fevers or chills.  Current Outpatient Prescriptions  Medication Sig Dispense Refill  . B Complex Vitamins (VITAMIN-B COMPLEX PO) Take 1 tablet by mouth daily.       . brimonidine-timolol (COMBIGAN) 0.2-0.5 % ophthalmic solution Place 1 drop into the right eye every 12 (twelve) hours.      . dorzolamide (TRUSOPT) 2 % ophthalmic solution Place 1 drop into the right eye 2 (two) times daily.       Marland Kitchen [employer] UNILET LANCETS 30G MISC by Does not apply route.       . fish oil-omega-3 fatty acids 1000 MG capsule Take 1 g by mouth daily.       . fluticasone (FLONASE) 50 MCG/ACT nasal spray Place 1 spray into the nose daily as needed for allergies.       . furosemide (LASIX) 40 MG tablet Take 1.5 tablets (60 mg total) by mouth 2 (two) times daily.  180 tablet  3  . insulin lispro (HUMALOG) 100 UNIT/ML injection Inject 10-20 Units into the skin daily as needed for high blood sugar.       Marland Kitchen LANTUS 100 UNIT/ML injection Inject 20-70 Units into the skin at bedtime.       Marland Kitchen loratadine (CLARITIN) 10 MG tablet Take 10 mg by mouth daily as needed for allergies.       Marland Kitchen losartan (COZAAR) 25 MG tablet Take 25 mg by mouth daily.       . metoprolol (LOPRESSOR) 50 MG tablet Take 1 tablet (50 mg total) by mouth 2 (two) times daily.  180 tablet  3  . Misc Natural Products (OSTEO BI-FLEX TRIPLE STRENGTH PO) Take 1 tablet by mouth daily.       . Multiple Vitamins-Minerals (MULTIVITAMIN WITH MINERALS) tablet Take 1 tablet by mouth daily.       . nitroGLYCERIN (NITROSTAT) 0.4 MG SL tablet Place 0.4 mg under the tongue every 5 (five) minutes as needed for chest pain.       Bertram Gala  Glycol-Propyl Glycol (SYSTANE ULTRA OP) Place 1 drop into the left eye every morning.       . potassium gluconate 595 MG TABS Take 595 mg by mouth daily.      . Saw Palmetto 450 MG CAPS Take 450 mg by mouth 2 (two) times daily.      . simvastatin (ZOCOR) 40 MG tablet Take 40 mg by mouth every evening.       . travoprost, benzalkonium, (TRAVATAN) 0.004 % ophthalmic solution Place 1 drop into both eyes at bedtime.        No current facility-administered medications for this visit.     Physical Exam: BP 120/71  Pulse 82  Resp 16  Ht 5\' 9"  (1.753 m)  Wt 222 lb (100.699 kg)  BMI 32.77 kg/m2  SpO2 97% The incisions all looks ok. They are essentially healed with no erythema or drainage.  Diagnostic Tests: none  Impression:  I think the previous infection is resolved and we can proceed with the LV epicardial lead insertion. I reviewed  the surgery again with the patient and his wife. We discussed alternatives, benefits, and risks including but not limited to bleeding, infection, injury to the heart, malfunction of the system, and the possibility that biventricular pacing with the LV epicardial leads may not improve his symptoms like the previous pacing system did. He understands and agrees to proceed.  Plan:  He is scheduled for surgery tomorrow.

## 2012-10-24 ENCOUNTER — Inpatient Hospital Stay (HOSPITAL_COMMUNITY): Payer: Medicare Other

## 2012-10-24 ENCOUNTER — Encounter (HOSPITAL_COMMUNITY): Payer: Self-pay | Admitting: *Deleted

## 2012-10-24 ENCOUNTER — Inpatient Hospital Stay (HOSPITAL_COMMUNITY): Payer: Medicare Other | Admitting: Anesthesiology

## 2012-10-24 ENCOUNTER — Inpatient Hospital Stay (HOSPITAL_COMMUNITY)
Admission: RE | Admit: 2012-10-24 | Discharge: 2012-10-30 | DRG: 261 | Disposition: A | Discharge status: Home Health | Payer: Medicare Other | Source: Ambulatory Visit | Attending: Surgery | Admitting: Surgery

## 2012-10-24 ENCOUNTER — Encounter (HOSPITAL_COMMUNITY)
Admission: RE | Disposition: A | Discharge status: Home Health | Payer: Self-pay | Source: Ambulatory Visit | Attending: Surgery

## 2012-10-24 ENCOUNTER — Encounter (HOSPITAL_COMMUNITY): Payer: Self-pay | Admitting: Anesthesiology

## 2012-10-24 DIAGNOSIS — Z794 Long term (current) use of insulin: Secondary | ICD-10-CM

## 2012-10-24 DIAGNOSIS — Z95810 Presence of automatic (implantable) cardiac defibrillator: Secondary | ICD-10-CM

## 2012-10-24 DIAGNOSIS — Z860100 Personal history of colon polyps, unspecified: Secondary | ICD-10-CM

## 2012-10-24 DIAGNOSIS — E119 Type 2 diabetes mellitus without complications: Secondary | ICD-10-CM | POA: Diagnosis present

## 2012-10-24 DIAGNOSIS — G8929 Other chronic pain: Secondary | ICD-10-CM | POA: Diagnosis present

## 2012-10-24 DIAGNOSIS — R5381 Other malaise: Secondary | ICD-10-CM | POA: Diagnosis not present

## 2012-10-24 DIAGNOSIS — I498 Other specified cardiac arrhythmias: Secondary | ICD-10-CM | POA: Diagnosis present

## 2012-10-24 DIAGNOSIS — I428 Other cardiomyopathies: Secondary | ICD-10-CM | POA: Diagnosis present

## 2012-10-24 DIAGNOSIS — H409 Unspecified glaucoma: Secondary | ICD-10-CM | POA: Diagnosis present

## 2012-10-24 DIAGNOSIS — R0902 Hypoxemia: Secondary | ICD-10-CM | POA: Diagnosis present

## 2012-10-24 DIAGNOSIS — M129 Arthropathy, unspecified: Secondary | ICD-10-CM | POA: Diagnosis present

## 2012-10-24 DIAGNOSIS — Z79899 Other long term (current) drug therapy: Secondary | ICD-10-CM

## 2012-10-24 DIAGNOSIS — J9819 Other pulmonary collapse: Secondary | ICD-10-CM | POA: Diagnosis not present

## 2012-10-24 DIAGNOSIS — I5022 Chronic systolic (congestive) heart failure: Secondary | ICD-10-CM | POA: Diagnosis present

## 2012-10-24 DIAGNOSIS — Z01812 Encounter for preprocedural laboratory examination: Secondary | ICD-10-CM

## 2012-10-24 DIAGNOSIS — I442 Atrioventricular block, complete: Principal | ICD-10-CM | POA: Diagnosis present

## 2012-10-24 DIAGNOSIS — I509 Heart failure, unspecified: Secondary | ICD-10-CM | POA: Diagnosis present

## 2012-10-24 DIAGNOSIS — D6959 Other secondary thrombocytopenia: Secondary | ICD-10-CM | POA: Diagnosis not present

## 2012-10-24 DIAGNOSIS — I519 Heart disease, unspecified: Secondary | ICD-10-CM

## 2012-10-24 DIAGNOSIS — I447 Left bundle-branch block, unspecified: Secondary | ICD-10-CM

## 2012-10-24 DIAGNOSIS — Z87891 Personal history of nicotine dependence: Secondary | ICD-10-CM

## 2012-10-24 DIAGNOSIS — I10 Essential (primary) hypertension: Secondary | ICD-10-CM | POA: Diagnosis present

## 2012-10-24 DIAGNOSIS — E785 Hyperlipidemia, unspecified: Secondary | ICD-10-CM | POA: Diagnosis present

## 2012-10-24 DIAGNOSIS — Z9981 Dependence on supplemental oxygen: Secondary | ICD-10-CM

## 2012-10-24 DIAGNOSIS — E876 Hypokalemia: Secondary | ICD-10-CM | POA: Diagnosis not present

## 2012-10-24 HISTORY — PX: EPICARDIAL PACING LEAD PLACEMENT: SHX6274

## 2012-10-24 HISTORY — PX: THORACOTOMY: SHX5074

## 2012-10-24 LAB — GLUCOSE, CAPILLARY
Glucose-Capillary: 124 mg/dL — ABNORMAL HIGH (ref 70–99)
Glucose-Capillary: 139 mg/dL — ABNORMAL HIGH (ref 70–99)
Glucose-Capillary: 147 mg/dL — ABNORMAL HIGH (ref 70–99)
Glucose-Capillary: 179 mg/dL — ABNORMAL HIGH (ref 70–99)

## 2012-10-24 SURGERY — THORACOTOMY, MAJOR
Anesthesia: General | Site: Chest | Wound class: Clean

## 2012-10-24 MED ORDER — BISACODYL 5 MG PO TBEC
10.0000 mg | DELAYED_RELEASE_TABLET | Freq: Every day | ORAL | Status: DC
  Administered 2012-10-25 – 2012-10-28 (×4): 10 mg via ORAL
  Filled 2012-10-24 (×4): qty 2

## 2012-10-24 MED ORDER — MORPHINE SULFATE 2 MG/ML IJ SOLN
INTRAMUSCULAR | Status: AC
  Filled 2012-10-24: qty 1

## 2012-10-24 MED ORDER — LIDOCAINE HCL 4 % MT SOLN
OROMUCOSAL | Status: DC | PRN
  Administered 2012-10-24: 4 mL via TOPICAL

## 2012-10-24 MED ORDER — DEXTROSE-NACL 5-0.45 % IV SOLN
INTRAVENOUS | Status: DC
  Administered 2012-10-24: 16:00:00 via INTRAVENOUS

## 2012-10-24 MED ORDER — BRIMONIDINE TARTRATE-TIMOLOL 0.2-0.5 % OP SOLN: 1.0000 [drp] | Freq: Two times a day (BID) | OPHTHALMIC | Status: DC

## 2012-10-24 MED ORDER — GLYCOPYRROLATE 0.2 MG/ML IJ SOLN
INTRAMUSCULAR | Status: DC | PRN
  Administered 2012-10-24 (×2): .8 mg via INTRAVENOUS

## 2012-10-24 MED ORDER — FENTANYL CITRATE 0.05 MG/ML IJ SOLN
INTRAMUSCULAR | Status: DC | PRN
  Administered 2012-10-24: 50 ug via INTRAVENOUS
  Administered 2012-10-24: 100 ug via INTRAVENOUS
  Administered 2012-10-24: 50 ug via INTRAVENOUS

## 2012-10-24 MED ORDER — POTASSIUM CHLORIDE 10 MEQ/50ML IV SOLN: 10.0000 meq | Freq: Every day | INTRAVENOUS | Status: DC | PRN

## 2012-10-24 MED ORDER — BRIMONIDINE TARTRATE 0.2 % OP SOLN
1.0000 [drp] | Freq: Two times a day (BID) | OPHTHALMIC | Status: DC
  Administered 2012-10-24 – 2012-10-30 (×12): 1 [drp] via OPHTHALMIC
  Filled 2012-10-24: qty 5

## 2012-10-24 MED ORDER — FUROSEMIDE 10 MG/ML IJ SOLN
40.0000 mg | Freq: Once | INTRAMUSCULAR | Status: AC
  Administered 2012-10-24: 40 mg via INTRAVENOUS
  Filled 2012-10-24: qty 4

## 2012-10-24 MED ORDER — MORPHINE SULFATE 2 MG/ML IJ SOLN
2.0000 mg | INTRAMUSCULAR | Status: DC | PRN
  Administered 2012-10-24 – 2012-10-25 (×4): 2 mg via INTRAVENOUS
  Filled 2012-10-24 (×3): qty 1

## 2012-10-24 MED ORDER — TRAVOPROST (BAK FREE) 0.004 % OP SOLN
1.0000 [drp] | Freq: Every day | OPHTHALMIC | Status: DC
  Administered 2012-10-24 – 2012-10-29 (×6): 1 [drp] via OPHTHALMIC
  Filled 2012-10-24: qty 2.5

## 2012-10-24 MED ORDER — TIMOLOL MALEATE 0.5 % OP SOLN
1.0000 [drp] | Freq: Two times a day (BID) | OPHTHALMIC | Status: DC
  Administered 2012-10-24 – 2012-10-30 (×12): 1 [drp] via OPHTHALMIC
  Filled 2012-10-24: qty 5

## 2012-10-24 MED ORDER — POTASSIUM GLUCONATE 595 (99 K) MG PO TABS: 595.0000 mg | ORAL_TABLET | Freq: Every day | ORAL | Status: DC

## 2012-10-24 MED ORDER — SIMVASTATIN 40 MG PO TABS
40.0000 mg | ORAL_TABLET | Freq: Every evening | ORAL | Status: DC
  Administered 2012-10-25 – 2012-10-29 (×5): 40 mg via ORAL
  Filled 2012-10-24 (×7): qty 1

## 2012-10-24 MED ORDER — 0.9 % SODIUM CHLORIDE (POUR BTL) OPTIME
TOPICAL | Status: DC | PRN
  Administered 2012-10-24: 1000 mL

## 2012-10-24 MED ORDER — ROCURONIUM BROMIDE 100 MG/10ML IV SOLN
INTRAVENOUS | Status: DC | PRN
  Administered 2012-10-24: 10 mg via INTRAVENOUS
  Administered 2012-10-24: 30 mg via INTRAVENOUS
  Administered 2012-10-24: 20 mg via INTRAVENOUS

## 2012-10-24 MED ORDER — OXYCODONE HCL 5 MG PO TABS: 5.0000 mg | ORAL_TABLET | ORAL | Status: AC | PRN

## 2012-10-24 MED ORDER — OXYCODONE-ACETAMINOPHEN 5-325 MG PO TABS
1.0000 | ORAL_TABLET | ORAL | Status: DC | PRN
  Administered 2012-10-26 (×2): 1 via ORAL

## 2012-10-24 MED ORDER — LACTATED RINGERS IV SOLN
INTRAVENOUS | Status: DC | PRN
  Administered 2012-10-24: 07:00:00 via INTRAVENOUS

## 2012-10-24 MED ORDER — ARTIFICIAL TEARS OP OINT
TOPICAL_OINTMENT | OPHTHALMIC | Status: DC | PRN
  Administered 2012-10-24: 1 via OPHTHALMIC

## 2012-10-24 MED ORDER — ONDANSETRON HCL 4 MG/2ML IJ SOLN: 4.0000 mg | Freq: Once | INTRAMUSCULAR | Status: DC | PRN

## 2012-10-24 MED ORDER — DEXTROSE 5 % IV SOLN
1.5000 g | Freq: Two times a day (BID) | INTRAVENOUS | Status: AC
  Administered 2012-10-24 – 2012-10-25 (×2): 1.5 g via INTRAVENOUS
  Filled 2012-10-24 (×2): qty 1.5

## 2012-10-24 MED ORDER — NEOSTIGMINE METHYLSULFATE 1 MG/ML IJ SOLN
INTRAMUSCULAR | Status: DC | PRN
  Administered 2012-10-24: 5 mg via INTRAVENOUS

## 2012-10-24 MED ORDER — OXYCODONE-ACETAMINOPHEN 5-325 MG PO TABS
1.0000 | ORAL_TABLET | ORAL | Status: DC | PRN
  Filled 2012-10-24 (×2): qty 1

## 2012-10-24 MED ORDER — FUROSEMIDE 10 MG/ML IJ SOLN
80.0000 mg | Freq: Once | INTRAMUSCULAR | Status: AC
  Administered 2012-10-24: 80 mg via INTRAVENOUS

## 2012-10-24 MED ORDER — ACETAMINOPHEN 160 MG/5ML PO SOLN: 1000.0000 mg | Freq: Four times a day (QID) | ORAL | Status: AC

## 2012-10-24 MED ORDER — INSULIN ASPART 100 UNIT/ML ~~LOC~~ SOLN
0.0000 [IU] | SUBCUTANEOUS | Status: DC
  Administered 2012-10-24 (×2): 2 [IU] via SUBCUTANEOUS
  Administered 2012-10-25: 4 [IU] via SUBCUTANEOUS
  Administered 2012-10-25 (×2): 2 [IU] via SUBCUTANEOUS
  Administered 2012-10-25: 4 [IU] via SUBCUTANEOUS
  Administered 2012-10-25: 2 [IU] via SUBCUTANEOUS
  Administered 2012-10-25: 4 [IU] via SUBCUTANEOUS
  Administered 2012-10-26 (×2): 2 [IU] via SUBCUTANEOUS
  Administered 2012-10-26 (×2): 4 [IU] via SUBCUTANEOUS

## 2012-10-24 MED ORDER — FLUTICASONE PROPIONATE 50 MCG/ACT NA SUSP
1.0000 | Freq: Every day | NASAL | Status: DC | PRN
  Filled 2012-10-24: qty 16

## 2012-10-24 MED ORDER — HYDROMORPHONE HCL PF 1 MG/ML IJ SOLN
0.2500 mg | INTRAMUSCULAR | Status: DC | PRN
  Administered 2012-10-24: 0.25 mg via INTRAVENOUS

## 2012-10-24 MED ORDER — ETOMIDATE 2 MG/ML IV SOLN
INTRAVENOUS | Status: DC | PRN
  Administered 2012-10-24: 10 mg via INTRAVENOUS

## 2012-10-24 MED ORDER — TRAVOPROST 0.004 % OP SOLN: 1.0000 [drp] | Freq: Every day | OPHTHALMIC | Status: DC

## 2012-10-24 MED ORDER — NALOXONE HCL 0.4 MG/ML IJ SOLN
INTRAMUSCULAR | Status: DC | PRN
  Administered 2012-10-24 (×3): .04 ug via INTRAVENOUS

## 2012-10-24 MED ORDER — DEXTROSE 5 % IV SOLN
1.5000 g | INTRAVENOUS | Status: AC
  Administered 2012-10-24: 1.5 g via INTRAVENOUS
  Filled 2012-10-24: qty 1.5

## 2012-10-24 MED ORDER — DORZOLAMIDE HCL 2 % OP SOLN
1.0000 [drp] | Freq: Two times a day (BID) | OPHTHALMIC | Status: DC
  Administered 2012-10-24 – 2012-10-30 (×12): 1 [drp] via OPHTHALMIC
  Filled 2012-10-24: qty 10

## 2012-10-24 MED ORDER — INSULIN DETEMIR 100 UNIT/ML ~~LOC~~ SOLN
20.0000 [IU] | Freq: Two times a day (BID) | SUBCUTANEOUS | Status: DC
  Administered 2012-10-24: 20 [IU] via SUBCUTANEOUS
  Filled 2012-10-24 (×3): qty 0.2

## 2012-10-24 MED ORDER — LORATADINE 10 MG PO TABS
10.0000 mg | ORAL_TABLET | Freq: Every day | ORAL | Status: DC | PRN
  Filled 2012-10-24: qty 1

## 2012-10-24 MED ORDER — ACETAMINOPHEN 500 MG PO TABS
1000.0000 mg | ORAL_TABLET | Freq: Four times a day (QID) | ORAL | Status: AC
  Filled 2012-10-24: qty 2

## 2012-10-24 MED ORDER — PROPOFOL 10 MG/ML IV BOLUS
INTRAVENOUS | Status: DC | PRN
  Administered 2012-10-24: 50 mg via INTRAVENOUS
  Administered 2012-10-24: 150 mg via INTRAVENOUS

## 2012-10-24 MED ORDER — SENNOSIDES-DOCUSATE SODIUM 8.6-50 MG PO TABS
1.0000 | ORAL_TABLET | Freq: Every evening | ORAL | Status: DC | PRN
  Filled 2012-10-24: qty 1

## 2012-10-24 MED ORDER — PHENYLEPHRINE HCL 10 MG/ML IJ SOLN
10.0000 mg | INTRAVENOUS | Status: DC | PRN
  Administered 2012-10-24: 25 ug/min via INTRAVENOUS

## 2012-10-24 MED ORDER — METOPROLOL TARTRATE 50 MG PO TABS
50.0000 mg | ORAL_TABLET | Freq: Two times a day (BID) | ORAL | Status: DC
  Administered 2012-10-26 – 2012-10-30 (×9): 50 mg via ORAL
  Filled 2012-10-24 (×14): qty 1

## 2012-10-24 MED ORDER — METOPROLOL TARTRATE 50 MG PO TABS
50.0000 mg | ORAL_TABLET | ORAL | Status: AC
  Administered 2012-10-24: 50 mg via ORAL
  Filled 2012-10-24 (×2): qty 1

## 2012-10-24 MED ORDER — FUROSEMIDE 10 MG/ML IJ SOLN
INTRAMUSCULAR | Status: AC
  Filled 2012-10-24: qty 8

## 2012-10-24 MED ORDER — LOSARTAN POTASSIUM 25 MG PO TABS
25.0000 mg | ORAL_TABLET | Freq: Every day | ORAL | Status: DC
  Administered 2012-10-26: 25 mg via ORAL
  Filled 2012-10-24 (×3): qty 1

## 2012-10-24 MED ORDER — FUROSEMIDE 40 MG PO TABS
60.0000 mg | ORAL_TABLET | Freq: Two times a day (BID) | ORAL | Status: DC
  Administered 2012-10-26 – 2012-10-29 (×7): 60 mg via ORAL
  Filled 2012-10-24 (×13): qty 1

## 2012-10-24 MED ORDER — ONDANSETRON HCL 4 MG/2ML IJ SOLN
4.0000 mg | Freq: Four times a day (QID) | INTRAMUSCULAR | Status: DC | PRN
Start: 2012-10-24 — End: 2012-10-30

## 2012-10-24 MED ORDER — LIDOCAINE HCL (CARDIAC) 20 MG/ML IV SOLN
INTRAVENOUS | Status: DC | PRN
  Administered 2012-10-24: 100 mg via INTRAVENOUS

## 2012-10-24 MED ORDER — HYDROMORPHONE HCL PF 1 MG/ML IJ SOLN
INTRAMUSCULAR | Status: AC
  Filled 2012-10-24: qty 1

## 2012-10-24 MED ORDER — SUCCINYLCHOLINE CHLORIDE 20 MG/ML IJ SOLN
INTRAMUSCULAR | Status: DC | PRN
  Administered 2012-10-24: 120 mg via INTRAVENOUS

## 2012-10-24 MED ORDER — ONDANSETRON HCL 4 MG/2ML IJ SOLN
INTRAMUSCULAR | Status: DC | PRN
  Administered 2012-10-24: 4 mg via INTRAVENOUS

## 2012-10-24 SURGICAL SUPPLY — 69 items
ADH SKN CLS APL DERMABOND .7 (GAUZE/BANDAGES/DRESSINGS) ×2
APL SKNCLS STERI-STRIP NONHPOA (GAUZE/BANDAGES/DRESSINGS)
BENZOIN TINCTURE PRP APPL 2/3 (GAUZE/BANDAGES/DRESSINGS) IMPLANT
CANISTER SUCTION 2500CC (MISCELLANEOUS) ×5 IMPLANT
CATH KIT ON Q 5IN SLV (PAIN MANAGEMENT) IMPLANT
CATH THORACIC 28FR (CATHETERS) IMPLANT
CATH THORACIC 36FR (CATHETERS) IMPLANT
CATH THORACIC 36FR RT ANG (CATHETERS) IMPLANT
CLIP TI MEDIUM 6 (CLIP) ×3 IMPLANT
CLOTH BEACON ORANGE TIMEOUT ST (SAFETY) ×3 IMPLANT
CONN ST 1/4X3/8  BEN (MISCELLANEOUS)
CONN ST 1/4X3/8 BEN (MISCELLANEOUS) IMPLANT
CONN Y 3/8X3/8X3/8  BEN (MISCELLANEOUS)
CONN Y 3/8X3/8X3/8 BEN (MISCELLANEOUS) IMPLANT
CONT SPEC 4OZ CLIKSEAL STRL BL (MISCELLANEOUS) ×5 IMPLANT
COVER SURGICAL LIGHT HANDLE (MISCELLANEOUS) ×6 IMPLANT
DERMABOND ADVANCED (GAUZE/BANDAGES/DRESSINGS) ×1
DERMABOND ADVANCED .7 DNX12 (GAUZE/BANDAGES/DRESSINGS) ×2 IMPLANT
DRAIN CHANNEL 28F RND 3/8 FF (WOUND CARE) IMPLANT
DRAIN CHANNEL 32F RND 10.7 FF (WOUND CARE) ×1 IMPLANT
DRAPE LAPAROSCOPIC ABDOMINAL (DRAPES) ×3 IMPLANT
DRAPE WARM FLUID 44X44 (DRAPE) ×3 IMPLANT
DRILL BIT 5/64 (BIT) IMPLANT
ELECT REM PT RETURN 9FT ADLT (ELECTROSURGICAL) ×3
ELECTRODE REM PT RTRN 9FT ADLT (ELECTROSURGICAL) ×2 IMPLANT
GLOVE BIO SURGEON STRL SZ 6 (GLOVE) ×1 IMPLANT
GLOVE BIOGEL PI IND STRL 7.0 (GLOVE) IMPLANT
GLOVE BIOGEL PI INDICATOR 7.0 (GLOVE) ×2
GLOVE EUDERMIC 7 POWDERFREE (GLOVE) ×6 IMPLANT
GOWN PREVENTION PLUS XLARGE (GOWN DISPOSABLE) ×3 IMPLANT
GOWN STRL NON-REIN LRG LVL3 (GOWN DISPOSABLE) ×6 IMPLANT
IMPL BIOMEC 54 ~~LOC~~ (Pacemaker) IMPLANT
IMPLANT BIOMEC 54 ~~LOC~~ (Pacemaker) ×6 IMPLANT
KIT BASIN OR (CUSTOM PROCEDURE TRAY) ×3 IMPLANT
KIT ROOM TURNOVER OR (KITS) ×3 IMPLANT
Lead Cap ×1 IMPLANT
NS IRRIG 1000ML POUR BTL (IV SOLUTION) ×6 IMPLANT
PACK CHEST (CUSTOM PROCEDURE TRAY) ×3 IMPLANT
PAD ARMBOARD 7.5X6 YLW CONV (MISCELLANEOUS) ×6 IMPLANT
SEALANT SURG COSEAL 4ML (VASCULAR PRODUCTS) IMPLANT
SEALANT SURG COSEAL 8ML (VASCULAR PRODUCTS) IMPLANT
SOLUTION ANTI FOG 6CC (MISCELLANEOUS) ×3 IMPLANT
SPONGE GAUZE 4X4 12PLY (GAUZE/BANDAGES/DRESSINGS) ×3 IMPLANT
SUT PROLENE 3 0 SH DA (SUTURE) IMPLANT
SUT SILK  1 MH (SUTURE) ×1
SUT SILK 1 MH (SUTURE) ×4 IMPLANT
SUT SILK 2 0 SH (SUTURE) ×4 IMPLANT
SUT SILK 2 0SH CR/8 30 (SUTURE) IMPLANT
SUT SILK 3 0SH CR/8 30 (SUTURE) IMPLANT
SUT VIC AB 1 CTX 36 (SUTURE) ×3
SUT VIC AB 1 CTX36XBRD ANBCTR (SUTURE) ×2 IMPLANT
SUT VIC AB 2-0 CT1 27 (SUTURE) ×3
SUT VIC AB 2-0 CT1 TAPERPNT 27 (SUTURE) IMPLANT
SUT VIC AB 2-0 CTX 36 (SUTURE) ×2 IMPLANT
SUT VIC AB 2-0 UR6 27 (SUTURE) IMPLANT
SUT VIC AB 3-0 MH 27 (SUTURE) IMPLANT
SUT VIC AB 3-0 SH 27 (SUTURE) ×3
SUT VIC AB 3-0 SH 27X BRD (SUTURE) IMPLANT
SUT VIC AB 3-0 X1 27 (SUTURE) ×3 IMPLANT
SUT VICRYL 2 TP 1 (SUTURE) ×3 IMPLANT
SUT VICRYL 4-0 PS2 18IN ABS (SUTURE) ×1 IMPLANT
SYSTEM SAHARA CHEST DRAIN ATS (WOUND CARE) ×3 IMPLANT
TIP APPLICATOR SPRAY EXTEND 16 (VASCULAR PRODUCTS) IMPLANT
TOWEL OR 17X24 6PK STRL BLUE (TOWEL DISPOSABLE) ×3 IMPLANT
TOWEL OR 17X26 10 PK STRL BLUE (TOWEL DISPOSABLE) ×4 IMPLANT
TRAP SPECIMEN MUCOUS 40CC (MISCELLANEOUS) IMPLANT
TRAY FOLEY CATH 14FRSI W/METER (CATHETERS) ×3 IMPLANT
TUNNELER SHEATH ON-Q 11GX8 (MISCELLANEOUS) IMPLANT
WATER STERILE IRR 1000ML POUR (IV SOLUTION) ×6 IMPLANT

## 2012-10-24 NOTE — H&P (Signed)
[address]       [name] [address]             [phone]      PCP is Pearla Dubonnet, MD  Referring Provider is Marinus Maw, MD  Chief Complaint   Patient presents with   .  Congestive Heart Failure     Discuss Epicardial LV Lead Placement, attemped insertion of left ventricular pacing lead on 09/30/12- Dr Ladona Ridgel   HPI:  The patient is a 79 year old gentleman with a long-standing history of non-ischemic cardiomyopathy, complete heart block and chronic systolic heart failure who had a biventricular pacer insertion around 2004. It was upgraded to a BiV ICD in 2008. In 01/2009 he had the old BiV ICD generator removed and a new LV lead placed via the right IJ vein and tunneled over to the left sided generator which was changed out to a BiV pacer due to reaching the ERI and having an elevated RV defib threshold. 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 In May of this year he presented with an infection in the LV lead pocket in the area lateral to the right IJV. He was treated with a course of antibiotics with continued drainage requiring removal of the LV lead on 08/21/2012. Then on 09/30/2012 he had an attemp at insertion of a new LV lead via the right subclavian vein but it was not possible to keep the lead in satisfactory position in the cardiac veins to allow pacing. Therefore the lead was removed. He says that biventricular pacing has improved his shortness of breath and exertional tolerance and he would like to return to that state if possible.   Past Medical History   Diagnosis  Date   .  Cardiomyopathy      broken 3x's   .  History of fracture of nose    .  Diabetes mellitus      type 2   .  Hyperlipidemia    .  Arthritis    .  Hypertension    .  Dysrhythmia      HX OF COMPLETE HEART BLOCK   .  CHF (congestive heart failure)    .  Pacemaker     Past Surgical History   Procedure   Laterality  Date   .  Transthoracic echocardiogram   01/2007   .  Pacemaker lead removal  N/A  08/21/2012     Procedure: PACEMAKER LEAD REMOVAL; Surgeon: Marinus Maw, MD; Location: [facility] OR; Service: Cardiovascular; Laterality: N/A;   .  Back surgery     .  Knee surgery     History reviewed. No pertinent family history.  Social History  History   Substance Use Topics   .  Smoking status:  Former Smoker     Quit date:  03/20/1968   .  Smokeless tobacco:  Never Used   .  Alcohol Use:  No    Current Outpatient Prescriptions   Medication  Sig  Dispense  Refill   .  B Complex Vitamins (VITAMIN-B COMPLEX PO)  Take 1 tablet by mouth daily.     .  brimonidine-timolol (COMBIGAN) 0.2-0.5 % ophthalmic solution  Place 1 drop into the right eye every 12 (twelve) hours.     .  dorzolamide (TRUSOPT) 2 % ophthalmic solution  Place 1 drop into the right eye 2 (two) times  daily.     Marland Kitchen  [employer] UNILET LANCETS 30G MISC  by Does not apply route.     .  fish oil-omega-3 fatty acids 1000 MG capsule  Take 1 g by mouth daily.     .  fluticasone (FLONASE) 50 MCG/ACT nasal spray  Place 1 spray into the nose daily as needed for allergies.     .  furosemide (LASIX) 40 MG tablet  Take 1.5 tablets (60 mg total) by mouth 2 (two) times daily.  180 tablet  3   .  Glucose Blood (BAYER BREEZE 2 TEST VI)  by In Vitro route 4 (four) times daily.     .  insulin lispro (HUMALOG) 100 UNIT/ML injection  Inject 10-20 Units into the skin daily as needed for high blood sugar.     Marland Kitchen  LANTUS 100 UNIT/ML injection  Inject 20-70 Units into the skin at bedtime.     Marland Kitchen  loratadine (CLARITIN) 10 MG tablet  Take 10 mg by mouth daily as needed for allergies.     Marland Kitchen  losartan (COZAAR) 25 MG tablet  Take 25 mg by mouth daily.     .  metoprolol succinate (TOPROL-XL) 100 MG 24 hr tablet  Take 1 tablet (100 mg total) by mouth daily. Take with or immediately following a meal.  30 tablet  4   .  Misc Natural Products (OSTEO BI-FLEX TRIPLE STRENGTH  PO)  Take 1 tablet by mouth daily.     .  Multiple Vitamins-Minerals (MULTIVITAMIN WITH MINERALS) tablet  Take 1 tablet by mouth daily.     .  nitroGLYCERIN (NITROSTAT) 0.4 MG SL tablet  Place 0.4 mg under the tongue every 5 (five) minutes as needed for chest pain.     Bertram Gala Glycol-Propyl Glycol (SYSTANE ULTRA OP)  Place 1 drop into the left eye every morning.     .  potassium gluconate 595 MG TABS  Take 595 mg by mouth daily.     .  Saw Palmetto 450 MG CAPS  Take 450 mg by mouth 2 (two) times daily.     .  simvastatin (ZOCOR) 40 MG tablet  Take 40 mg by mouth every evening.     .  travoprost, benzalkonium, (TRAVATAN) 0.004 % ophthalmic solution  Place 1 drop into both eyes at bedtime.      No current facility-administered medications for this visit.    Allergies   Allergen  Reactions   .  Clindamycin/Lincomycin  Nausea And Vomiting   Review of Systems  Constitutional: Positive for activity change and fatigue. Negative for fever, chills and appetite change.  HENT: Negative.  Eyes: Negative.  Respiratory: Positive for shortness of breath. Negative for chest tightness.  Cardiovascular: Negative for chest pain, palpitations and leg swelling.  Endocrine: Negative.  Genitourinary: Negative.  Musculoskeletal: Negative.  Allergic/Immunologic: Negative.  Neurological: Negative.  Hematological: Negative.  Psychiatric/Behavioral: Negative.   BP 127/81  Pulse 79  Resp 20  Ht 5\' 10"  (1.778 m)  Wt 224 lb (101.606 kg)  BMI 32.14 kg/m2  SpO2 94%   Physical Exam   Constitutional: He is oriented to person, place, and time.  Elderly, chronically ill-appearing gentleman in no distress wearing oxygen.  HENT:  Head: Normocephalic and atraumatic.  Mouth/Throat: Oropharynx is clear and moist.  Eyes: EOM are normal. Pupils are equal, round, and reactive to light.  Neck: Normal range of motion. Neck supple. JVD present.  Cardiovascular: Normal rate, regular rhythm, normal heart sounds  and  intact distal pulses.  No murmur heard.  Pulmonary/Chest: Effort normal and breath sounds normal. No respiratory distress. He has no wheezes. He has no rales.  Recent surgical wound right subclavicular region that is healed.  There is an incision in the midline over the upper sternum that is healed  The BiV generator is in a left subclavicular pocket that looks ok.  Abdominal: Soft. Bowel sounds are normal. He exhibits no distension and no mass. There is no tenderness.  Musculoskeletal: Normal range of motion. He exhibits no edema.  Neurological: He is alert and oriented to person, place, and time. He has normal strength. No cranial nerve deficit or sensory deficit.  Skin: Skin is warm and dry.  Psychiatric: He has a normal mood and affect.  Diagnostic Tests:  *RADIOLOGY REPORT*  Clinical Data: 79 year old male with chest pain. Attempted left  ventricular lead insertion.  CHEST - 2 VIEW  Comparison: 08/22/2012 and earlier.  Findings: Semi upright AP and lateral views of the chest. Left  chest cardiac AICD device with multiple transvenous leads. The RV  apex is not included on the AP view. On the lateral view, on the  lateral leads configuration appears stable. Stable cardiac size  and mediastinal contours.  No pneumothorax identified. Increased left lung base atelectasis.  Increased pulmonary vascularity without overt edema. No definite  effusion. No acute osseous abnormality identified.  IMPRESSION:  1. Increased retrocardiac atelectasis. No pneumothorax or  definite effusion.  2. Left chest AICD lead appears stable in configuration on the  lateral view. The RV apex is not entirely included on the AP view.  Original Report Authenticated By: Erskine Speed, M.D.   Impression:   He has severe NICM with complete heart block and chronic systolic heart failure with significant symptom improvement with BiV pacing. He is now without an LV lead and it could not be inserted percutaneously. I  think it would be worth inserting an LV epicardial lead to improve his quality of life. I reviewed the surgery with the patient and his wife. We discussed alternatives, benefits, and risks including but not limited to bleeding, infection, injury to the heart, malfunction of the system, and the possibility that biventricular pacing with the LV epicardial leads may not improve his symptoms like the previous pacing system did. He understands and agrees to proceed.   Plan:   Insertion of LV epicardial leads through a left thoracotomy and revision of his biventricular pacing system.

## 2012-10-24 NOTE — Care Management Note (Signed)
    Page 1 of 2   10/30/2012     3:48:10 PM   CARE MANAGEMENT NOTE 10/30/2012  Patient:  Louis Acosta, Louis Acosta   Account Number:  [id number]  Date Initiated:  10/24/2012  Documentation initiated by:  [facility]  Subjective/Objective Assessment:   Post op THORACOTOMY MAJOR (Left)  EPICARDIAL PACING LEAD PLACEMENT (N/A) - LV EPICARDIAL LEADS  Revision of biventricular pacing system     Action/Plan:   Anticipated DC Date:  10/29/2012   Anticipated DC Plan:  HOME W HOME HEALTH SERVICES         [facility] Choice  HOME HEALTH   Choice offered to / List presented to:  C-1 Patient        HH arranged  HH-1 RN  HH-2 PT      HH agency  [facility]   Status of service:  Completed, signed off Medicare Important Message given?   (If response is "NO", the following Medicare IM given date fields will be blank) Date Medicare IM given:   Date Additional Medicare IM given:    Discharge Disposition:  HOME W HOME HEALTH SERVICES  Per UR Regulation:  Reviewed for med. necessity/level of care/duration of stay  If discussed at Long Length of Stay Meetings, dates discussed:    Comments:  ContactKarin, Acosta [phone]   [phone]                 Louis,Acosta Daughter [phone]  10/30/12 Louis Enrico,RN,BSN [phone] PT DISCHARGED HOME TODAY WITH WIFE.  NOTIFIED [facility] OF DC DATE.  10/29/12 Louis Filter,RN,BSN [phone] MET WITH PT AND WIFE TO DISCUSS DC PLANS.  PT HAS HOME O2, PROVIDED BY [facility].  HE ALSO HAS WALKER AND SHOWER SEAT AT HOME.  REFERRAL TO [facility], PER PT/WIFE CHOICE.  START OF CARE 24-48H POST DC DATE.

## 2012-10-24 NOTE — Preoperative (Signed)
Beta Blockers   Reason not to administer Beta Blockers:Lopressor this am

## 2012-10-24 NOTE — Progress Notes (Signed)
Pt found with NRB off, sats in the 50s, labored breathing requiring accessory muscles and coloring appeared dusky.  Reapplied NRB.  Sats continued in the 50s.  RT called to bedside to assist and assess. Pt sats slowly climbing and reached >90%.  Educated pt on importance of leaving NRB mask in place.  Pt acknowledge understanding.  Will continue to monitor.

## 2012-10-24 NOTE — Procedures (Signed)
Extubation Procedure Note  Patient Details:   Name: Louis Acosta DOB: Aug 03, 1931 MRN: [id number]   Airway Documentation:  Airway 8 mm (Active)  Secured at (cm) 23 cm 10/24/2012 12:00 AM    Evaluation  O2 sats: stable throughout Complications: No apparent complications Patient did tolerate procedure well. Bilateral Breath Sounds: Clear   Yes  Patient extubated by CRNA and placed on 100% NRB. Able to vocalize and clear secretions.  RT will continue to monitor.  Lurlean Leyden 10/24/2012, 11:10 AM

## 2012-10-24 NOTE — Anesthesia Postprocedure Evaluation (Signed)
  Anesthesia Post-op Note  Patient: Louis Acosta  Procedure(s) Performed: Procedure(s) with comments: THORACOTOMY MAJOR (Left) EPICARDIAL PACING LEAD PLACEMENT (N/A) - LV EPICARDIAL LEADS  Patient Location: PACU  Anesthesia Type:General  Level of Consciousness: awake, sedated and patient cooperative  Airway and Oxygen Therapy: Patient Spontanous Breathing and non-rebreather face mask  Post-op Pain: mild  Post-op Assessment: Post-op Vital signs reviewed, Patient's Cardiovascular Status Stable, RESPIRATORY FUNCTION UNSTABLE, Patent Airway, No signs of Nausea or vomiting and Pain level controlled  Post-op Vital Signs: stable  Complications: No apparent anesthesia complications

## 2012-10-24 NOTE — Brief Op Note (Signed)
10/24/2012  10:07 AM  PATIENT:  Louis Acosta  79 y.o. male  PRE-OPERATIVE DIAGNOSIS:  LV DYSFUNCTION W/ CHF  POST-OPERATIVE DIAGNOSIS:  Left Ventricular Dysfunction/ Congestive Heart Failure  PROCEDURE:  Procedure(s) with comments: THORACOTOMY MAJOR (Left) EPICARDIAL PACING LEAD PLACEMENT (N/A) - LV EPICARDIAL LEADS Revision of biventricular pacing system  SURGEON:  Surgeon(s) and Role:    * Alleen Borne, MD - Primary  PHYSICIAN ASSISTANT: none  ASSISTANTS: none   ANESTHESIA:   general  EBL:  Total I/O In: 400 [I.V.:400] Out: 550 [Urine:450; Blood:100]  BLOOD ADMINISTERED:none  DRAINS: (48F) Blake drain(s) in the left pleural space   LOCAL MEDICATIONS USED:  NONE  SPECIMEN:  No Specimen  DISPOSITION OF SPECIMEN:  N/A  COUNTS:  YES  TOURNIQUET:  * No tourniquets in log *  PLAN OF CARE: Admit to inpatient   PATIENT DISPOSITION:  PACU - hemodynamically stable.   Delay start of Pharmacological VTE agent (>24hrs) due to surgical blood loss or risk of bleeding: yes

## 2012-10-24 NOTE — Anesthesia Procedure Notes (Signed)
Procedure Name: Intubation Date/Time: 10/24/2012 7:56 AM Performed by: Sherie Don Pre-anesthesia Checklist: Patient identified, Emergency Drugs available, Suction available, Patient being monitored and Timeout performed Patient Re-evaluated:Patient Re-evaluated prior to inductionOxygen Delivery Method: Circle system utilized Preoxygenation: Pre-oxygenation with 100% oxygen Intubation Type: IV induction Ventilation: Mask ventilation without difficulty and Oral airway inserted - appropriate to patient size Laryngoscope Size: Mac and 4 Grade View: Grade I Tube type: Oral Endobronchial tube: Left, Double lumen EBT, EBT position confirmed by auscultation and EBT position confirmed by fiberoptic bronchoscope and 39 Fr Number of attempts: 1 Airway Equipment and Method: Stylet and LTA kit utilized Placement Confirmation: ETT inserted through vocal cords under direct vision,  breath sounds checked- equal and bilateral and positive ETCO2 Tube secured with: Tape Dental Injury: Teeth and Oropharynx as per pre-operative assessment

## 2012-10-24 NOTE — Plan of Care (Signed)
Problem: Phase I Progression Outcomes Goal: O2 sats > or equal to 88% with O2 Outcome: Progressing On nonrebreather

## 2012-10-24 NOTE — Progress Notes (Signed)
Patient ID: Louis Acosta, male   DOB: [birth year], 79 y.o.   MRN: [id number]  SICU Evening Rounds:  Hemodynamically stable  Pacing with narrow complex  Urine output good  CT output low.

## 2012-10-24 NOTE — Anesthesia Preprocedure Evaluation (Signed)
Anesthesia Evaluation  Patient identified by MRN, date of birth, ID band Patient awake    Reviewed: Allergy & Precautions, H&P , NPO status , Patient's Chart, lab work & pertinent test results  Airway Mallampati: I TM Distance: >3 FB Neck ROM: full    Dental   Pulmonary          Cardiovascular hypertension, + angina + CAD and +CHF + dysrhythmias + pacemaker Rhythm:regular Rate:Normal     Neuro/Psych PSYCHIATRIC DISORDERS    GI/Hepatic hiatal hernia,   Endo/Other  diabetes  Renal/GU      Musculoskeletal   Abdominal   Peds  Hematology   Anesthesia Other Findings   Reproductive/Obstetrics                           Anesthesia Physical Anesthesia Plan  ASA: IV  Anesthesia Plan: General   Post-op Pain Management:    Induction: Intravenous  Airway Management Planned: Oral ETT  Additional Equipment: Arterial line and CVP  Intra-op Plan:   Post-operative Plan: Possible Post-op intubation/ventilation  Informed Consent: I have reviewed the patients History and Physical, chart, labs and discussed the procedure including the risks, benefits and alternatives for the proposed anesthesia with the patient or authorized representative who has indicated his/her understanding and acceptance.     Plan Discussed with: CRNA, Anesthesiologist and Surgeon  Anesthesia Plan Comments:         Anesthesia Quick Evaluation

## 2012-10-24 NOTE — Progress Notes (Signed)
Called to room by RN around 20:00. Pt had removed NRB and sats were in the 50s. NRB was placed back on pt and sats increased to 92%. Pt was assessed and wheezing was noted throughout entire left lung with decreased air movement. RN ordered STAT CXR which showed significant whiting out of left lung. RT will continue to monitor. Pt has no RT services ordered at this time.

## 2012-10-24 NOTE — Op Note (Signed)
CARDIOTHORACIC SURGERY OPERATIVE NOTE  10/24/2012 KYZER BLOWE [id number]  Surgeon:  Alleen Borne, MD  First Assistant: none   Preoperative Diagnosis:  Class III congestive heart failure with complete heart block and pacing induced LBBB  Postoperative Diagnosis: same  Procedure:  1. Left anterolateral thoracotomy 2. Insertion of 2 left ventricular epicardial pacing leads 3. Revision of Biventricular pacing system  Anesthesia:  General Endotracheal   Clinical History/Surgical Indication:  The patient is a 79 year old gentleman with a long-standing history of non-ischemic cardiomyopathy, complete heart block and chronic systolic heart failure who had a biventricular pacer insertion around 2004. It was upgraded to a BiV ICD in 2008. In 01/2009 he had the old BiV ICD generator removed and a new LV lead placed via the right IJ vein and tunneled over to the left sided generator which was changed out to a BiV pacer due to reaching the ERI and having an elevated RV defib threshold. 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 In May of this year he presented with an infection in the LV lead pocket in the area lateral to the right IJV. He was treated with a course of antibiotics with continued drainage requiring removal of the LV lead on 08/21/2012. Then on 09/30/2012 he had an attemp at insertion of a new LV lead via the right subclavian vein but it was not possible to keep the lead in satisfactory position in the cardiac veins to allow pacing. Therefore the lead was removed. He says that biventricular pacing has improved his shortness of breath and exertional tolerance and he would like to return to that state if possible. He has severe NICM with complete heart block and chronic systolic heart failure with significant symptom improvement with BiV pacing. He is now without an LV lead and it could not be inserted percutaneously. I think  it would be worth inserting an LV epicardial lead to improve his quality of life. I reviewed the surgery with the patient and his wife. We discussed alternatives, benefits, and risks including but not limited to bleeding, infection, injury to the heart, malfunction of the system, and the possibility that biventricular pacing with the LV epicardial leads may not improve his symptoms like the previous pacing system did. He understands and agrees to proceed.    Preparation:  The patient was seen in the preoperative holding area and the correct patient, correct operation, correct operative side were confirmed with the patient after reviewing the medical record. The consent was signed by me. Preoperative antibiotics were given.  The patient was taken back to the operating room and positioned supine on the operating room table. After being placed under general endotracheal anesthesia by the anesthesia team using a double lumen tube a foley catheter was placed. A roll was place beneath the left back to tilt the left side up slightly. The chest was prepped with betadine soap and solution.  A surgical time-out was taken and the correct patient,operative side, and operative procedure were confirmed with the nursing and anesthesia staff.   Operative Procedure:  A short anterolateral thoracotomy incision was made below the pectoralis border. The subcutaneous tissue was divided using electrocautery. The serratus muscle was split along its fibers. The pleural space was entered through the 6th intercostal space. The pericardium was immediately adjacent to the chest wall due to cardiac enlargement. The pericardium was opened. A site was chosen for pacing lead insertion on  the mid-lateral wall. A [employer] pacing lead was screwed into the myocardium ( model [id number], SN [id number]). It was tested and the Rs was 8.5. Impedence was 820-780. Threshold was 1.0 @ 0.63ms. Another lead of the same type was screwed into the  myocardium about 2 cm away from the first ( model [id number], SN [id number]). Testing showed an Rs of 12, Impedence of 830-760, and a threshold of 1.1@ 0.4 ms. The generator pocket was opened through the prior incision. There was a small amout of serous fluid present in the pocket. The generator was removed. The two LV epicardial leads were brought out through the interspace and tunneled along the subcutaneous tissue of the chest wall up to the generator pocket. The first lead ([id number]) was connected to the generator and the other lead was capped. Through the device the RA Ps was 4.5, impedence 460, and threshold 1.5 @ 0.4 ms. The RV lead had an impedence of 480 and a threshold of 1.25 @ 0.5 ms. No Rs was tested. The LV lead had an impedence of 580 and a threshold of 1.0 @ 0.60ms. The pocket was enlarged slightly inferiorly. The generator was replaced in the pocket and the excess lead coiled behind it. The pocket was irrigated with saline. The subcutaneous tissue was closed with 3-0 vicryl continuous suture. The skin was closed with 4-0 vicryl subcuticular suture. The thoracotomy incision was closed using continuous #0 vicryl suture for the serratus muscle, 2-0 vicryl subcutaneous suture and 3-0 vicryl subcuticular suture. Dermabond was applied.  All sponge, needle, and instrument counts were reported correct at the end of the case. Dry sterile dressings were placed over the incisions and around the chest tubes which were connected to pleurevac suction. The patient was extubated and transported to the PACU in satisfactory and stable condition.

## 2012-10-24 NOTE — Transfer of Care (Cosign Needed)
Immediate Anesthesia Transfer of Care Note  Patient: Louis Acosta  Procedure(s) Performed: Procedure(s) with comments: THORACOTOMY MAJOR (Left) EPICARDIAL PACING LEAD PLACEMENT (N/A) - LV EPICARDIAL LEADS  Patient Location: PACU  Anesthesia Type:General  Level of Consciousness: Patient remains intubated per anesthesia plan  Airway & Oxygen Therapy: Patient Spontanous Breathing and Patient remains intubated per anesthesia plan placed on ventilator by RT SpO2 100  Post-op Assessment: Report given to PACU RN and Post -op Vital signs reviewed and stable  Post vital signs: Reviewed and stable  Complications: No apparent anesthesia complications

## 2012-10-24 NOTE — Progress Notes (Signed)
Pt remains on 15L NRB mask, O2 sat 94%, resp. Rate regular, shallow, he responds approp. To commands. Dr Katrinka Blazing updated. OK to TX pt to 2314 on NRB.

## 2012-10-24 NOTE — Progress Notes (Signed)
Notified Sandy, rep from [employer] of patient's 0730 surgery and that [facility] cardiology stated that "rep would be here". She stated she would check on patient about 0700 this am.

## 2012-10-25 ENCOUNTER — Inpatient Hospital Stay (HOSPITAL_COMMUNITY): Payer: Medicare Other

## 2012-10-25 LAB — CBC
HCT: 32.6 % — ABNORMAL LOW (ref 39.0–52.0)
Platelets: 82 10*3/uL — ABNORMAL LOW (ref 150–400)
RBC: 3.32 MIL/uL — ABNORMAL LOW (ref 4.22–5.81)
RDW: 13.1 % (ref 11.5–15.5)
WBC: 8.6 10*3/uL (ref 4.0–10.5)

## 2012-10-25 LAB — GLUCOSE, CAPILLARY
Glucose-Capillary: 186 mg/dL — ABNORMAL HIGH (ref 70–99)
Glucose-Capillary: 165 mg/dL — ABNORMAL HIGH (ref 70–99)

## 2012-10-25 LAB — BASIC METABOLIC PANEL
BUN: 18 mg/dL (ref 6–23)
Chloride: 92 mEq/L — ABNORMAL LOW (ref 96–112)
GFR calc non Af Amer: 69 mL/min — ABNORMAL LOW (ref >90)
Glucose, Bld: 190 mg/dL — ABNORMAL HIGH (ref 70–99)
Potassium: 4.7 mEq/L (ref 3.5–5.1)

## 2012-10-25 MED ORDER — FUROSEMIDE 10 MG/ML IJ SOLN
60.0000 mg | Freq: Two times a day (BID) | INTRAMUSCULAR | Status: AC
  Administered 2012-10-25 (×2): 60 mg via INTRAVENOUS
  Filled 2012-10-25 (×2): qty 6

## 2012-10-25 MED ORDER — INSULIN DETEMIR 100 UNIT/ML ~~LOC~~ SOLN
30.0000 [IU] | Freq: Two times a day (BID) | SUBCUTANEOUS | Status: DC
  Administered 2012-10-25 – 2012-10-30 (×11): 30 [IU] via SUBCUTANEOUS
  Filled 2012-10-25 (×12): qty 0.3

## 2012-10-25 NOTE — Progress Notes (Signed)
Pt appears work of breathing increased.  Pt using accessory muscles and appears pale.  RT to bedside to assess as well.  Dr. Laneta Simmers called and updated on status.  Received orders for 40mg  IV Lasix and BiPap.  Will carry out orders and continue to monitor pt.

## 2012-10-25 NOTE — Progress Notes (Signed)
Patient ID: Louis Acosta, male   DOB: [birth year], 79 y.o.   MRN: [id number] EVENING ROUNDS NOTE :     [address]       [name] [address]             [phone]                 1 Day Post-Op Procedure(s) (LRB): THORACOTOMY MAJOR (Left) EPICARDIAL PACING LEAD PLACEMENT (N/A)  Total Length of Stay:  LOS: 1 day  BP 110/55  Pulse 97  Temp(Src) 98.6 F (37 C) (Oral)  Resp 22  Wt 228 lb 13.4 oz (103.8 kg)  BMI 33.78 kg/m2  SpO2 88%  .Intake/Output     08/08 0701 - 08/09 0700   I.V. (mL/kg) 575 (5.5)   IV Piggyback 50   Total Intake(mL/kg) 625 (6)   Urine (mL/kg/hr) 700 (0.5)   Blood    Chest Tube 120 (0.1)   Total Output 820   Net -195         . dextrose 5 % and 0.45% NaCl 50 mL/hr at 10/24/12 1612     Lab Results  Component Value Date   WBC 8.6 10/25/2012   HGB 11.0* 10/25/2012   HCT 32.6* 10/25/2012   PLT 82* 10/25/2012   GLUCOSE 190* 10/25/2012   ALT 12 10/23/2012   AST 24 10/23/2012   NA 138 10/25/2012   K 4.7 10/25/2012   CL 92* 10/25/2012   CREATININE 1.00 10/25/2012   BUN 18 10/25/2012   CO2 37* 10/25/2012   INR 1.22 10/23/2012   Stable after lead placement   Delight Ovens MD  Beeper [phone] Office [phone] 10/25/2012 7:44 PM

## 2012-10-25 NOTE — Progress Notes (Signed)
1 Day Post-Op Procedure(s) (LRB): THORACOTOMY MAJOR (Left) EPICARDIAL PACING LEAD PLACEMENT (N/A) Subjective:  Feels better this am. Had a rough night with his breathing. He required 100% FIO2 to maintain his sats. Desats into 50's with mask off.  Objective: Vital signs in last 24 hours: Temp:  [97.4 F (36.3 C)-98.3 F (36.8 C)] 98.3 F (36.8 C) (08/08 0757) Pulse Rate:  [68-96] 90 (08/08 0800) Cardiac Rhythm:  [-] Ventricular paced (08/08 0800) Resp:  [11-28] 21 (08/08 0800) BP: (91-121)/(51-97) 117/58 mmHg (08/08 0800) SpO2:  [55 %-100 %] 100 % (08/08 0800) Arterial Line BP: (119-144)/(56-65) 133/56 mmHg (08/07 1800) FiO2 (%):  [50 %-100 %] 100 % (08/08 0800) Weight:  [103.8 kg (228 lb 13.4 oz)] 103.8 kg (228 lb 13.4 oz) (08/08 0600)   Hemodynamic parameters for last 24 hours:    Intake/Output from previous day: 08/07 0701 - 08/08 0700 In: 1720 [I.V.:1620; IV Piggyback:100] Out: 3335 [Urine:2925; Blood:100; Chest Tube:310] Intake/Output this shift: Total I/O In: 75 [I.V.:25; IV Piggyback:50] Out: 150 [Urine:100; Chest Tube:50]  General appearance: alert and cooperative Neurologic: intact Heart: regular rate and rhythm, S1, S2 normal, no murmur, click, rub or gallop Lungs: diminished breath sounds LLL Extremities: extremities normal, atraumatic, no cyanosis or edema Wound: dressing dry. Minimal chest tube output  Lab Results:  Recent Labs  10/23/12 0955 10/25/12 0330  WBC 5.5 8.6  HGB 10.7* 11.0*  HCT 30.9* 32.6*  PLT 85* 82*   BMET:  Recent Labs  10/23/12 0955 10/25/12 0330  NA 133* 138  K 3.8 4.7  CL 91* 92*  CO2 32 37*  GLUCOSE 251* 190*  BUN 16 18  CREATININE 0.87 1.00  CALCIUM 9.3 9.0    PT/INR:  Recent Labs  10/23/12 0955  LABPROT 15.1  INR 1.22   ABG    Component Value Date/Time   PHART 7.399 10/23/2012 0955   HCO3 33.3* 10/23/2012 0955   TCO2 35.0 10/23/2012 0955   O2SAT 97.8 10/23/2012 0955   CBG (last 3)   Recent Labs   10/24/12 2336 10/25/12 0328 10/25/12 0755  GLUCAP 179* 186* 155*   *RADIOLOGY REPORT*  Clinical Data: Chest tube in place.  PORTABLE CHEST - 1 VIEW  Comparison: Chest x-ray 10/24/2012.  Findings: Left-sided chest tube remains in position with tip and  sideport projecting over the left hemithorax. Persistent dense  opacification throughout the left mid and lower hemithorax may  reflect areas of atelectasis and/or consolidation in the left base  with superimposed moderate left pleural effusion. Right lung  appears clear. No right pleural effusion. No appreciable  pneumothorax at this time. Cardiopericardial silhouette appears  enlarged (unchanged). Cephalization of the pulmonary vasculature,  without frank pulmonary edema. Nodular opacity projecting over the  lateral aspect of the right upper lung at the junction of the  anterolateral right first rib and posterolateral right 4th rib  measuring approximately 9 mm, increased in conspicuity compared to  prior examinations dating back to 06/20/2012.  Atherosclerosis of the thoracic aorta. The patient is rotated to  the left on today's exam, resulting in distortion of the  mediastinal contours and reduced diagnostic sensitivity and  specificity for mediastinal pathology. Left-sided biventricular  pacemaker / AICD with lead tips projecting over the expected  location of the right atrium, right ventricular apex and left  ventricle via the coronary sinus and coronary veins. In addition,  there are epicardial leads projecting over the left side of the  heart.  IMPRESSION:  1. Support apparatus, as  above.  2. Atelectasis and/or consolidation in the left lower lobe with  possible moderate left sided pleural effusion.  3. Cardiomegaly with pulmonary venous congestion, but no frank  pulmonary edema.  4. Increased conspicuity of right upper lobe pulmonary nodule  compared to several prior examinations. Attention on follow-up  studies is  recommended, with consideration for further evaluation  with CT of the thorax if there is clinical concern for neoplasm.  Original Report Authenticated By: Trudie Reed, M.D.   Assessment/Plan: S/P Procedure(s) (LRB): THORACOTOMY MAJOR (Left) EPICARDIAL PACING LEAD PLACEMENT (N/A)  He is hemodynamically stable but BP is still a little low to resume lopressor and losartan. Will hold for today. Continue diuretic IV. DC chest tube. Work on IS. He needs PT consult to help with mobilization. Pacer checked by rep this am and is working well.   LOS: 1 day    BARTLE,BRYAN K 10/25/2012

## 2012-10-25 NOTE — Progress Notes (Signed)
Pt refuses to lay any way except on his left said.  Pt states he has to lay on left side d/t "hernias on his right side that pinch off his air".  Pt remains turned left, on BiPap and o2 sats 99%.  Will continue to monitor.

## 2012-10-26 ENCOUNTER — Inpatient Hospital Stay (HOSPITAL_COMMUNITY): Payer: Medicare Other

## 2012-10-26 LAB — GLUCOSE, CAPILLARY
Glucose-Capillary: 101 mg/dL — ABNORMAL HIGH (ref 70–99)
Glucose-Capillary: 111 mg/dL — ABNORMAL HIGH (ref 70–99)
Glucose-Capillary: 128 mg/dL — ABNORMAL HIGH (ref 70–99)
Glucose-Capillary: 144 mg/dL — ABNORMAL HIGH (ref 70–99)
Glucose-Capillary: 184 mg/dL — ABNORMAL HIGH (ref 70–99)

## 2012-10-26 LAB — BASIC METABOLIC PANEL
Calcium: 8.9 mg/dL (ref 8.4–10.5)
Creatinine, Ser: 0.92 mg/dL (ref 0.50–1.35)
GFR calc Af Amer: >90 mL/min (ref >90)

## 2012-10-26 MED ORDER — POTASSIUM CHLORIDE CRYS ER 10 MEQ PO TBCR
10.0000 meq | EXTENDED_RELEASE_TABLET | Freq: Every day | ORAL | Status: DC
  Administered 2012-10-26 – 2012-10-30 (×4): 10 meq via ORAL
  Filled 2012-10-26 (×5): qty 1

## 2012-10-26 MED ORDER — SODIUM CHLORIDE 0.9 % IJ SOLN
3.0000 mL | Freq: Two times a day (BID) | INTRAMUSCULAR | Status: DC
  Administered 2012-10-26 – 2012-10-29 (×6): 3 mL via INTRAVENOUS

## 2012-10-26 MED ORDER — TRAMADOL HCL 50 MG PO TABS
50.0000 mg | ORAL_TABLET | Freq: Four times a day (QID) | ORAL | Status: DC | PRN
  Administered 2012-10-27: 50 mg via ORAL
  Filled 2012-10-26: qty 1

## 2012-10-26 MED ORDER — POTASSIUM GLUCONATE 595 (99 K) MG PO TABS: 595.0000 mg | ORAL_TABLET | Freq: Every day | ORAL | Status: DC

## 2012-10-26 MED ORDER — SODIUM CHLORIDE 0.9 % IJ SOLN
3.0000 mL | INTRAMUSCULAR | Status: DC | PRN
  Administered 2012-10-26: 3 mL via INTRAVENOUS

## 2012-10-26 NOTE — Evaluation (Signed)
Physical Therapy Evaluation Patient Details Name: Louis Acosta MRN: [id number] DOB: 1931/10/20 Today's Date: 10/26/2012 Time: 4098-1191 PT Time Calculation (min): 27 min  PT Assessment / Plan / Recommendation History of Present Illness  Left Ventricular Dysfunction/ Congestive Heart Failure, now s/p revision on pacing placement  Clinical Impression  Patient demonstrates deficits in functional mobility as indicated below. Patient will benefit from skilled PT to address deficits and maximize function. Will continue to see as indicated. Upon discharge, recommend HHPT.     PT Assessment  Patient needs continued PT services    Follow Up Recommendations  Home health PT;Supervision/Assistance - 24 hour          Equipment Recommendations  None recommended by PT       Frequency Min 4X/week    Precautions / Restrictions Restrictions Weight Bearing Restrictions: No   Pertinent Vitals/Pain 7/10 pain in right ankle when ambulating      Mobility  Bed Mobility Bed Mobility: Not assessed Transfers Transfers: Sit to Stand;Stand to Sit Sit to Stand: 1: +2 Total assist;With armrests;From chair/3-in-1 Sit to Stand: Patient Percentage: 60% Stand to Sit: 3: Mod assist Details for Transfer Assistance: VCs for controlled movement and body postioning Ambulation/Gait Ambulation/Gait Assistance: 4: Min assist Ambulation Distance (Feet): 120 Feet Assistive device: Rolling walker Ambulation/Gait Assistance Details: some instability, VCs for upright posture, minimal assist for stability and control of rw at times, as patient became fatigued gait more unsteady Gait Pattern: Step-through pattern;Decreased stride length;Antalgic;Trunk flexed;Wide base of support Gait velocity: decreased        PT Diagnosis: Difficulty walking;Generalized weakness  PT Problem List: Decreased strength;Decreased range of motion;Decreased activity tolerance;Decreased balance;Decreased mobility PT Treatment  Interventions: DME instruction;Gait training;Stair training;Functional mobility training;Therapeutic activities;Therapeutic exercise;Balance training;Patient/family education     PT Goals(Current goals can be found in the care plan section) Acute Rehab PT Goals Patient Stated Goal: to go home PT Goal Formulation: With patient Time For Goal Achievement: 11/09/12 Potential to Achieve Goals: Good  Visit Information  Last PT Received On: 10/26/12 Assistance Needed: +2 (for line management and safety) History of Present Illness: Left Ventricular Dysfunction/ Congestive Heart Failure, now s/p revision on pacing placement       Prior Functioning  Home Living Family/patient expects to be discharged to:: Private residence Living Arrangements: Spouse/significant other;Children Available Help at Discharge: Family Type of Home: House Home Access: Level entry Home Layout: Two level;Bed/bath upstairs Alternate Level Stairs-Rails: Can reach both Home Equipment: Walker - 2 wheels;Walker - 4 wheels;Cane - single point Additional Comments: pt plans to have grab bar installed at tub Prior Function Level of Independence: Needs assistance Communication Communication: HOH Dominant Hand: Right    Cognition  Cognition Arousal/Alertness: Awake/alert Behavior During Therapy: WFL for tasks assessed/performed Overall Cognitive Status: Within Functional Limits for tasks assessed    Extremity/Trunk Assessment Upper Extremity Assessment Upper Extremity Assessment: Defer to OT evaluation Lower Extremity Assessment Lower Extremity Assessment: Generalized weakness;RLE deficits/detail RLE Deficits / Details: pain in right ankle s/p fall RLE Sensation: decreased proprioception (secondary to degenerative discs in back)   Balance Balance Balance Assessed: Yes High Level Balance High Level Balance Activites: Side stepping;Backward walking;Direction changes;Turns High Level Balance Comments: Cues for  position within rw; some instability noted  End of Session PT - End of Session Equipment Utilized During Treatment: Gait belt;Oxygen Activity Tolerance: Patient limited by fatigue Patient left: in chair;with call bell/phone within reach;with nursing/sitter in room Nurse Communication: Mobility status  GP     Fabio Asa  10/26/2012, 9:08 AM [place] Crumb, PT DPT  [phone]

## 2012-10-26 NOTE — Progress Notes (Signed)
2 Days Post-Op Procedure(s) (LRB): THORACOTOMY MAJOR (Left) EPICARDIAL PACING LEAD PLACEMENT (N/A) Subjective:  Feels better this am. Had a rough night with his breathing. He required bipapa last night.  Confused but muck better then last night  Objective: Vital signs in last 24 hours: Temp:  [97.9 F (36.6 C)-98.8 F (37.1 C)] 98.1 F (36.7 C) (08/09 0804) Pulse Rate:  [86-105] 105 (08/09 0900) Cardiac Rhythm:  [-] Ventricular paced (08/09 0746) Resp:  [16-28] 22 (08/09 0900) BP: (92-148)/(48-114) 105/72 mmHg (08/09 0900) SpO2:  [76 %-99 %] 95 % (08/09 0900) FiO2 (%):  [40 %-50 %] 50 % (08/09 0400) Weight:  [216 lb 14.9 oz (98.4 kg)] 216 lb 14.9 oz (98.4 kg) (08/09 0600)   Hemodynamic parameters for last 24 hours:    Intake/Output from previous day: 08/08 0701 - 08/09 0700 In: 1705 [P.O.:480; I.V.:1175; IV Piggyback:50] Out: 1660 [Urine:1540; Chest Tube:120] Intake/Output this shift: Total I/O In: 100 [I.V.:100] Out: 65 [Urine:65]  General appearance: alert and cooperative Neurologic: intact Heart: regular rate and rhythm, S1, S2 normal, no murmur, click, rub or gallop Lungs: diminished breath sounds LLL Extremities: extremities normal, atraumatic, no cyanosis or edema Wound: dressing dry.chest tube output is out   Lab Results:  Recent Labs  10/23/12 0955 10/25/12 0330  WBC 5.5 8.6  HGB 10.7* 11.0*  HCT 30.9* 32.6*  PLT 85* 82*   BMET:   Recent Labs  10/25/12 0330 10/26/12 0515  NA 138 138  K 4.7 3.6  CL 92* 94*  CO2 37* 36*  GLUCOSE 190* 100*  BUN 18 20  CREATININE 1.00 0.92  CALCIUM 9.0 8.9    PT/INR:   Recent Labs  10/23/12 0955  LABPROT 15.1  INR 1.22   ABG    Component Value Date/Time   PHART 7.399 10/23/2012 0955   HCO3 33.3* 10/23/2012 0955   TCO2 35.0 10/23/2012 0955   O2SAT 97.8 10/23/2012 0955   CBG (last 3)   Recent Labs  10/26/12 0017 10/26/12 0346 10/26/12 0801  GLUCAP 111* 101* 90   Dg Chest Port 1 View  10/26/2012    *RADIOLOGY REPORT*  Clinical Data: Left thoracotomy  PORTABLE CHEST - 1 VIEW  Comparison: 10/25/2012  Findings: Left chest tube has been removed.  Left lower lobe consolidation and effusion unchanged.  Mild right lower lobe atelectasis unchanged.  No definite heart failure.  The heart is enlarged. Negative for pneumothorax.  IMPRESSION: Left lower lobe consolidation and effusion are unchanged.  No pneumothorax .   Original Report Authenticated By: Janeece Riggers, M.D.      Assessment/Plan: S/P Procedure(s) (LRB): THORACOTOMY MAJOR (Left) EPICARDIAL PACING LEAD PLACEMENT (N/A) Hypokalemia- replace po desat with minimal activity, on home o2 at 3 liters He is hemodynamically stable but BP is still a little low will resume lopressor at reduced dose and continue to hold losartan. Continue diuretic IV.  Work on IS.  PT consult to help with mobilization. D/c foley   LOS: 2 days    Louis Acosta B 10/26/2012

## 2012-10-26 NOTE — Progress Notes (Signed)
Patient ID: [name], male   DOB: 01/25/1932, 79 y.o.   MRN: [id number] EVENING ROUNDS NOTE :     [address]       [place] [address]             [phone]                 2 Days Post-Op Procedure(s) (LRB): THORACOTOMY MAJOR (Left) EPICARDIAL PACING LEAD PLACEMENT (N/A)  Total Length of Stay:  LOS: 2 days  BP 116/62  Pulse 90  Temp(Src) 98.2 F (36.8 C) (Oral)  Resp 21  Wt 216 lb 14.9 oz (98.4 kg)  BMI 32.02 kg/m2  SpO2 91%  .Intake/Output     08/09 0701 - 08/10 0700   P.O.    I.V. (mL/kg) 550 (5.6)   IV Piggyback    Total Intake(mL/kg) 550 (5.6)   Urine (mL/kg/hr) 345 (0.3)   Chest Tube    Total Output 345   Net +205         . dextrose 5 % and 0.45% NaCl 50 mL/hr at 10/25/12 2000     Lab Results  Component Value Date   WBC 8.6 10/25/2012   HGB 11.0* 10/25/2012   HCT 32.6* 10/25/2012   PLT 82* 10/25/2012   GLUCOSE 100* 10/26/2012   ALT 12 10/23/2012   AST 24 10/23/2012   NA 138 10/26/2012   K 3.6 10/26/2012   CL 94* 10/26/2012   CREATININE 0.92 10/26/2012   BUN 20 10/26/2012   CO2 36* 10/26/2012   INR 1.22 10/23/2012   Hold cozaar for BP Mental status much improved from last night  Delight Ovens MD  Beeper [phone] Office [phone] 10/26/2012 7:49 PM

## 2012-10-27 LAB — GLUCOSE, CAPILLARY
Glucose-Capillary: 79 mg/dL (ref 70–99)
Glucose-Capillary: 92 mg/dL (ref 70–99)
Glucose-Capillary: 117 mg/dL — ABNORMAL HIGH (ref 70–99)
Glucose-Capillary: 123 mg/dL — ABNORMAL HIGH (ref 70–99)

## 2012-10-27 MED ORDER — INSULIN ASPART 100 UNIT/ML ~~LOC~~ SOLN: 0.0000 [IU] | SUBCUTANEOUS | Status: DC

## 2012-10-27 MED ORDER — INSULIN ASPART 100 UNIT/ML ~~LOC~~ SOLN
0.0000 [IU] | Freq: Three times a day (TID) | SUBCUTANEOUS | Status: DC
  Administered 2012-10-27: 2 [IU] via SUBCUTANEOUS
  Administered 2012-10-28: 3 [IU] via SUBCUTANEOUS
  Administered 2012-10-29: 4 [IU] via SUBCUTANEOUS

## 2012-10-27 NOTE — Progress Notes (Signed)
Pt found sitting on side of bed attempting to get OOB, when asked about notifying staff to get OOB Pt states "You all were to busy, I didn't want to bother you."; explanation given to Pt about high fall risk and necessity of notifying staff when getting OOB to which Pt again stating not wanting to bother staff, Pt assisted to bedside chair and re-inforced the need to call staff if the need to move arises again. Also re-inforced at this time was the need to get adequate sleep to improve recovery outcome, to which Pt stated he hasn't slept in 3 weeks and sleeps when he wants, becoming agitated at staff for encouraging necessary sleep; after de-escalating Pt situation, staff increased observation and compliance with safe Pt movement.

## 2012-10-27 NOTE — Progress Notes (Addendum)
3 Days Post-Op Procedure(s) (LRB): THORACOTOMY MAJOR (Left) EPICARDIAL PACING LEAD PLACEMENT (N/A) Subjective:   He required bipap at night Confused but muck better then last night  Objective: Vital signs in last 24 hours: Temp:  [97.7 F (36.5 C)-98.2 F (36.8 C)] 97.9 F (36.6 C) (08/10 0818) Pulse Rate:  [65-101] 72 (08/10 0800) Cardiac Rhythm:  [-] Ventricular paced (08/10 0800) Resp:  [17-25] 17 (08/10 0800) BP: (94-139)/(47-77) 110/60 mmHg (08/10 0800) SpO2:  [90 %-100 %] 96 % (08/10 0800)   Hemodynamic parameters for last 24 hours:    Intake/Output from previous day: 08/09 0701 - 08/10 0700 In: 1733 [P.O.:1080; I.V.:653] Out: 670 [Urine:670] Intake/Output this shift: Total I/O In: 120 [P.O.:120] Out: -   General appearance: alert and cooperative Neurologic: intact Heart: regular rate and rhythm, S1, S2 normal, no murmur, click, rub or gallop Lungs: diminished breath sounds LLL Extremities: extremities normal, atraumatic, no cyanosis or edema Wound: dressing dry  Lab Results:  Recent Labs  10/25/12 0330  WBC 8.6  HGB 11.0*  HCT 32.6*  PLT 82*   BMET:   Recent Labs  10/25/12 0330 10/26/12 0515  NA 138 138  K 4.7 3.6  CL 92* 94*  CO2 37* 36*  GLUCOSE 190* 100*  BUN 18 20  CREATININE 1.00 0.92  CALCIUM 9.0 8.9    PT/INR:  No results found for this basename: LABPROT, INR,  in the last 72 hours ABG    Component Value Date/Time   PHART 7.399 10/23/2012 0955   HCO3 33.3* 10/23/2012 0955   TCO2 35.0 10/23/2012 0955   O2SAT 97.8 10/23/2012 0955   CBG (last 3)   Recent Labs  10/26/12 2326 10/27/12 0354 10/27/12 0816  GLUCAP 144* 79 92   Dg Chest Port 1 View  10/26/2012   *RADIOLOGY REPORT*  Clinical Data: Left thoracotomy  PORTABLE CHEST - 1 VIEW  Comparison: 10/25/2012  Findings: Left chest tube has been removed.  Left lower lobe consolidation and effusion unchanged.  Mild right lower lobe atelectasis unchanged.  No definite heart failure.  The  heart is enlarged. Negative for pneumothorax.  IMPRESSION: Left lower lobe consolidation and effusion are unchanged.  No pneumothorax .   Original Report Authenticated By: Janeece Riggers, M.D.      Assessment/Plan: S/P Procedure(s) (LRB): THORACOTOMY MAJOR (Left) EPICARDIAL PACING LEAD PLACEMENT (N/A)  desat with minimal activity, on home o2 at 3 liters He is hemodynamically stable but BP is still a little low will resume lopressor at reduced dose and continue to hold losartan. D/c foley To step down in am if respiratory status stable  thrombocytopenia  82,000 avoiding heparin   LOS: 3 days    Narda Fundora B 10/27/2012

## 2012-10-27 NOTE — Progress Notes (Signed)
Patient ID: Louis Acosta, male   DOB: 1932/01/29, 79 y.o.   MRN: [id number] EVENING ROUNDS NOTE :     [address]       [place] [address]             [phone]                 3 Days Post-Op Procedure(s) (LRB): THORACOTOMY MAJOR (Left) EPICARDIAL PACING LEAD PLACEMENT (N/A)  Total Length of Stay:  LOS: 3 days  BP 131/66  Pulse 81  Temp(Src) 98 F (36.7 C) (Oral)  Resp 20  Ht 5\' 8"  (1.727 m)  Wt 216 lb 14.9 oz (98.4 kg)  BMI 32.99 kg/m2  SpO2 94%  .Intake/Output     08/10 0701 - 08/11 0700   P.O. 240   I.V. (mL/kg)    Total Intake(mL/kg) 240 (2.4)   Urine (mL/kg/hr) 300 (0.2)   Total Output 300   Net -60         . dextrose 5 % and 0.45% NaCl 20 mL/hr (10/26/12 2054)     Lab Results  Component Value Date   WBC 8.6 10/25/2012   HGB 11.0* 10/25/2012   HCT 32.6* 10/25/2012   PLT 82* 10/25/2012   GLUCOSE 100* 10/26/2012   ALT 12 10/23/2012   AST 24 10/23/2012   NA 138 10/26/2012   K 3.6 10/26/2012   CL 94* 10/26/2012   CREATININE 0.92 10/26/2012   BUN 20 10/26/2012   CO2 36* 10/26/2012   INR 1.22 10/23/2012    Improved stable day  Delight Ovens MD  Beeper [phone] Office [phone] 10/27/2012 7:26 PM

## 2012-10-27 NOTE — Progress Notes (Deleted)
Transfer to [facility], received by RN & tech after report called. VSS, tolerated tx well.

## 2012-10-28 ENCOUNTER — Inpatient Hospital Stay (HOSPITAL_COMMUNITY): Payer: Medicare Other

## 2012-10-28 LAB — CBC
HCT: 31.1 % — ABNORMAL LOW (ref 39.0–52.0)
Hemoglobin: 10.8 g/dL — ABNORMAL LOW (ref 13.0–17.0)
MCH: 33 pg (ref 26.0–34.0)
MCHC: 34.7 g/dL (ref 30.0–36.0)
MCV: 95.1 fL (ref 78.0–100.0)
Platelets: 93 10*3/uL — ABNORMAL LOW (ref 150–400)
RBC: 3.27 MIL/uL — ABNORMAL LOW (ref 4.22–5.81)
RDW: 13 % (ref 11.5–15.5)
WBC: 6.8 10*3/uL (ref 4.0–10.5)

## 2012-10-28 LAB — BASIC METABOLIC PANEL
BUN: 30 mg/dL — ABNORMAL HIGH (ref 6–23)
CO2: 36 mEq/L — ABNORMAL HIGH (ref 19–32)
Calcium: 9.3 mg/dL (ref 8.4–10.5)
Chloride: 88 mEq/L — ABNORMAL LOW (ref 96–112)
Creatinine, Ser: 0.92 mg/dL (ref 0.50–1.35)
GFR calc Af Amer: >90 mL/min (ref >90)
GFR calc non Af Amer: 78 mL/min — ABNORMAL LOW (ref >90)
Glucose, Bld: 98 mg/dL (ref 70–99)
Potassium: 3.4 mEq/L — ABNORMAL LOW (ref 3.5–5.1)
Sodium: 132 mEq/L — ABNORMAL LOW (ref 135–145)

## 2012-10-28 LAB — GLUCOSE, CAPILLARY
Glucose-Capillary: 126 mg/dL — ABNORMAL HIGH (ref 70–99)
Glucose-Capillary: 168 mg/dL — ABNORMAL HIGH (ref 70–99)
Glucose-Capillary: 113 mg/dL — ABNORMAL HIGH (ref 70–99)

## 2012-10-28 MED ORDER — POTASSIUM CHLORIDE CRYS ER 20 MEQ PO TBCR: 20.0000 meq | EXTENDED_RELEASE_TABLET | ORAL | Status: AC | PRN

## 2012-10-28 MED ORDER — LACTULOSE 10 GM/15ML PO SOLN
30.0000 g | Freq: Every day | ORAL | Status: DC | PRN
  Filled 2012-10-28: qty 45

## 2012-10-28 MED ORDER — POTASSIUM CHLORIDE CRYS ER 20 MEQ PO TBCR: 20.0000 meq | EXTENDED_RELEASE_TABLET | Freq: Two times a day (BID) | ORAL | Status: DC

## 2012-10-28 MED ORDER — POTASSIUM CHLORIDE CRYS ER 20 MEQ PO TBCR
EXTENDED_RELEASE_TABLET | ORAL | Status: AC
  Administered 2012-10-28: 20 meq via ORAL
  Filled 2012-10-28: qty 1

## 2012-10-28 MED ORDER — POLYVINYL ALCOHOL 1.4 % OP SOLN
1.0000 [drp] | Freq: Every day | OPHTHALMIC | Status: DC
  Administered 2012-10-28 – 2012-10-30 (×3): 1 [drp] via OPHTHALMIC
  Filled 2012-10-28: qty 15

## 2012-10-28 MED ORDER — POTASSIUM CHLORIDE CRYS ER 20 MEQ PO TBCR
20.0000 meq | EXTENDED_RELEASE_TABLET | ORAL | Status: AC
  Administered 2012-10-28 (×2): 20 meq via ORAL
  Filled 2012-10-28 (×2): qty 1

## 2012-10-28 NOTE — Progress Notes (Signed)
Pt's K+= 3.4 and creat= 0.92 w/  CrCl  = 78; TCTS KCL protocol initiated with K Dur 20 mEq PO given. Pt noted to have K Dur 10 mEq PO regularly for 1000 so further dosing will be discussed with Dr Laneta Simmers during A.M. Rounds.

## 2012-10-28 NOTE — Progress Notes (Signed)
Physical Therapy Treatment Patient Details Name: Louis Acosta MRN: [id number] DOB: [birth year] Today's Date: 10/28/2012 Time: 4098-1191 PT Time Calculation (min): 26 min  PT Assessment / Plan / Recommendation  History of Present Illness Left Ventricular Dysfunction/ Congestive Heart Failure, now s/p revision on pacing placement   PT Comments   Patient making steady progress towards PT goals, able to ambulate increased distance with less supplemental O2 today. Patient >93% on 3 liters throughout ambulation, when attempted 2 liters patient desaturated in the mid 80s. Will continue to see and progress activity as tolerated.   Follow Up Recommendations  Home health PT;Supervision/Assistance - 24 hour           Equipment Recommendations  None recommended by PT       Frequency Min 4X/week   Progress towards PT Goals Progress towards PT goals: Progressing toward goals  Plan Current plan remains appropriate    Precautions / Restrictions Restrictions Weight Bearing Restrictions: No   Pertinent Vitals/Pain No pain at this time, SpO2 on 4 liters 98%, on 3 liters >93%, on 2 liters desaturated into the mid 80s.    Mobility  Bed Mobility Bed Mobility: Not assessed Transfers Transfers: Sit to Stand;Stand to Sit Sit to Stand: 4: Min guard Stand to Sit: 4: Min guard Details for Transfer Assistance: VCs for controlled movement and body postioning Ambulation/Gait Ambulation/Gait Assistance: 5: Supervision Ambulation Distance (Feet): 400 Feet Assistive device: Rolling walker Ambulation/Gait Assistance Details: VCs for upright posture and rest breaks and energy conservation techniques Gait Pattern: Step-through pattern;Decreased stride length;Antalgic;Trunk flexed;Wide base of support Gait velocity: decreased General Gait Details: steady with ambulation using rw        PT Diagnosis: Difficulty walking;Generalized weakness  PT Problem List: Decreased strength;Decreased range of  motion;Decreased activity tolerance;Decreased balance;Decreased mobility PT Treatment Interventions: DME instruction;Gait training;Stair training;Functional mobility training;Therapeutic activities;Therapeutic exercise;Balance training;Patient/family education   PT Goals (current goals can now be found in the care plan section) Acute Rehab PT Goals Patient Stated Goal: to go home PT Goal Formulation: With patient Time For Goal Achievement: 11/09/12 Potential to Achieve Goals: Good  Visit Information  Last PT Received On: 10/28/12 Assistance Needed: +2 (for line management and safety) History of Present Illness: Left Ventricular Dysfunction/ Congestive Heart Failure, now s/p revision on pacing placement    Subjective Data  Subjective: I just want to get home Patient Stated Goal: to go home   Cognition  Cognition Arousal/Alertness: Awake/alert Behavior During Therapy: WFL for tasks assessed/performed Overall Cognitive Status: Within Functional Limits for tasks assessed    Balance  Balance Balance Assessed: Yes High Level Balance High Level Balance Activites: Side stepping;Backward walking;Direction changes;Turns High Level Balance Comments: Improved stability noted  End of Session PT - End of Session Equipment Utilized During Treatment: Gait belt;Oxygen Activity Tolerance: Patient tolerated treatment well Patient left: in chair;with call bell/phone within reach;with family/visitor present Nurse Communication: Mobility status   GP     Fabio Asa 10/28/2012, 12:51 PM Charlotte Crumb, PT DPT  [phone]

## 2012-10-28 NOTE — Clinical Documentation Improvement (Signed)
THIS DOCUMENT IS NOT A PERMANENT PART OF THE MEDICAL RECORD  Please update your documentation with the medical record to reflect your response to this query. If you need help knowing how to do this please call [phone].  10/28/12  Dear Lowella Dandy Marton Redwood,  In a better effort to capture your patient's severity of illness, reflect appropriate length of stay and utilization of resources, a review of the patient medical record has revealed the following indicators.    Based on your clinical judgment, please clarify and document in a progress note and/or discharge summary the clinical condition associated with the following supporting information:   Possible Clinical Conditions?  _______Acute on Chronic Respiratory Failure  _______Chronic Respiratory Failure  _______Other Condition  _______Cannot Clinically Determine     Rik Factors: Hypoxemia, on home O2 @ 3L, desats with minimal activity noted per 8/11 progress notes. Required Bipap last night, noted per 8/09 progress notess.                  You may use possible, probable, or suspect with inpatient documentation. possible, probable, suspected diagnoses MUST be documented at the time of discharge  Reviewed: No additional documentation provided per discharge summary of 10/29/12.  Thank You,  Marciano Sequin,  Clinical Documentation Specialist: [phone] Health Information Management [place]

## 2012-10-28 NOTE — Plan of Care (Signed)
Problem: Phase I Progression Outcomes Goal: Initial discharge plan identified Outcome: Completed/Met Date Met:  10/28/12 Home with wife.  Patient is on home O2

## 2012-10-28 NOTE — Progress Notes (Addendum)
      [address]       [name] [address]             [phone]      4 Days Post-Op Procedure(s) (LRB): THORACOTOMY MAJOR (Left) EPICARDIAL PACING LEAD PLACEMENT (N/A)  Subjective:  [name] states he feels much better this morning.  He continues to desaturate with ambulation.  However he is on oxygen at home. No BM  Objective: Vital signs in last 24 hours: Temp:  [98 F (36.7 C)-98.3 F (36.8 C)] 98.3 F (36.8 C) (08/10 2000) Pulse Rate:  [62-82] 65 (08/11 0600) Cardiac Rhythm:  [-] Ventricular paced (08/10 2000) Resp:  [15-24] 20 (08/11 0600) BP: (85-135)/(50-75) 106/53 mmHg (08/11 0400) SpO2:  [94 %-98 %] 96 % (08/11 0600)  Intake/Output from previous day: 08/10 0701 - 08/11 0700 In: 846 [P.O.:840; I.V.:6] Out: 650 [Urine:650]  General appearance: alert, cooperative and no distress Heart: regular rate and rhythm Lungs: clear to auscultation bilaterally Abdomen: soft, non-tender; bowel sounds normal; no masses,  no organomegaly Extremities: edema trace Wound: clean and dry  Lab Results:  Recent Labs  10/28/12 0450  WBC 6.8  HGB 10.8*  HCT 31.1*  PLT 93*   BMET:  Recent Labs  10/26/12 0515 10/28/12 0450  NA 138 132*  K 3.6 3.4*  CL 94* 88*  CO2 36* 36*  GLUCOSE 100* 98  BUN 20 30*  CREATININE 0.92 0.92  CALCIUM 8.9 9.3    PT/INR: No results found for this basename: LABPROT, INR,  in the last 72 hours ABG    Component Value Date/Time   PHART 7.399 10/23/2012 0955   HCO3 33.3* 10/23/2012 0955   TCO2 35.0 10/23/2012 0955   O2SAT 97.8 10/23/2012 0955   CBG (last 3)   Recent Labs  10/27/12 1601 10/27/12 2207 10/28/12 0811  GLUCAP 88 123* 86    Assessment/Plan: S/P Procedure(s) (LRB): THORACOTOMY MAJOR (Left) EPICARDIAL PACING LEAD PLACEMENT (N/A)  1. CV- Sinus brady, blood pressure improved this morning 2. Pulm- hypoxemia-on oxygen at home at 3L, desats with minimal activity here, encouraged use of IS 3.  Thrombocytopenia-improving continue to avoid Heparin 4. Dispo- transfer to 2000, continue current care  LOS: 4 days    Lowella Dandy 10/28/2012 CXR reviewed patient examined and medical record reviewed,agree with above note. VAN TRIGT III,PETER 10/28/2012

## 2012-10-28 NOTE — Plan of Care (Signed)
Problem: Phase III Progression Outcomes Goal: Transferred to Telemetry Outcome: Completed/Met Date Met:  10/28/12 1300

## 2012-10-29 ENCOUNTER — Encounter (HOSPITAL_COMMUNITY): Payer: Self-pay | Admitting: Surgery

## 2012-10-29 ENCOUNTER — Inpatient Hospital Stay (HOSPITAL_COMMUNITY): Payer: Medicare Other

## 2012-10-29 MED ORDER — FUROSEMIDE 10 MG/ML IJ SOLN
40.0000 mg | Freq: Two times a day (BID) | INTRAMUSCULAR | Status: AC
  Administered 2012-10-29 (×2): 40 mg via INTRAVENOUS
  Filled 2012-10-29 (×2): qty 4

## 2012-10-29 MED ORDER — LACTULOSE 10 GM/15ML PO SOLN
30.0000 g | Freq: Every day | ORAL | Status: AC
  Administered 2012-10-29: 30 g via ORAL
  Filled 2012-10-29: qty 45

## 2012-10-29 NOTE — Discharge Summary (Signed)
[address]       [name] [address]             [phone]              Discharge Summary  Name: Louis Acosta DOB: 1931/11/20 79 y.o. MRN: [id number]   Admission Date: 10/24/2012 Discharge Date: 10/30/2012    Admitting Diagnosis: Class III congestive heart failure  Complete heart block  Pacing induced left bundle branch block   Discharge Diagnosis:  Class III congestive heart failure  Complete heart block  Pacing induced left bundle branch block Postoperative thrombocytopenia  Past Medical History  Diagnosis Date  . Cardiomyopathy     broken 3x's  . History of fracture of nose   . Hyperlipidemia     takes Simvastatin daily  . Arthritis   . Seasonal allergies     uses Flonase prn and takes Claritin prn  . Dysrhythmia     HX OF COMPLETE HEART BLOCK;takes Metoprolol daily  . Hypertension     takes Losartan daily  . CHF (congestive heart failure)     takes Furosemide daily  . PONV (postoperative nausea and vomiting)   . Pacemaker     St Jude  . Anginal pain     within the last 2 wks has taken Ntg 1 time  . Shortness of breath     lying and with exertion  . Confusion     short term memory loss  . Joint pain   . Chronic back pain   . Skin spots, red   . H/O hiatal hernia   . History of colon polyps   . Diverticulosis   . Enlarged prostate     takes [employer]  . Nocturia   . Diabetes mellitus     type 2;takes Lantus nightly and Humalog prn  . Glaucoma     uses several drops  . History of shingles   . History of staph infection     50+yrs ago       Procedures: LEFT ANTERIOR THORACOTOMY - 10/24/2012  REVISION OF BIVENTRICULAR PACING SYSTEM  LEFT VENTRICULAR EPICARDIAL PACING LEAD PLACEMENT x 2      HPI:  The patient is a 79 y.o. male with a long-standing history of non-ischemic cardiomyopathy, complete heart block and chronic systolic heart failure who had a biventricular pacer insertion around 2004. It was  upgraded to a BiV ICD in 2008. In 01/2009, he had the old BiV ICD generator removed and a new LV lead placed via the right IJ vein and tunneled over to the left sided generator which was changed out to a BiV pacer due to reaching the ERI and having an elevated RV defib threshold. 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 In May of this year, he presented with an infection in the LV lead pocket in the area lateral to the right IJV. He was treated with a course of antibiotics with continued drainage, requiring removal of the LV lead on 08/21/2012. Then, on 09/30/2012, he had an attempt at insertion of a new LV lead via the right subclavian vein, but it was not possible to keep the lead in satisfactory position in the cardiac veins to allow pacing. Therefore, the lead was removed. He says that biventricular pacing has improved his shortness of breath and exertional tolerance and he would like to return to that state if  possible. He has severe NICM with complete heart block and chronic systolic heart failure with significant symptom improvement with BiV pacing. He is now without an LV lead and it could not be inserted percutaneously. He was referred to Dr. Laneta Simmers for surgical evaluation. He felt that it would be worth inserting an LV epicardial lead to improve his quality of life. All risks, benefits and alternatives of surgery were explained in detail, and the patient agreed to proceed.   Hospital Course:  The patient was admitted to [facility] on 10/24/2012. The patient was taken to the operating room and underwent the above procedure.   The postoperative course was notable for some early hypotension and bradycardia, which resolved with conservative treatment. His pacer was interrogated and is functioning appropriately.  He has been deconditioned, and physical therapy was consulted to assist with ambulation.  His main issue has been oxygen desaturation.  He initially required bipap while in the ICU.  He has been treated with aggressive pulmonary toilet and diuresis.  He currently is maintaining sats of greater than 90% on 3 liters, which he was on previously at home.  He has had a postoperative thrombocytopenia which was observed closely, and his platelet count is slowly improving.  Incisions are healing well.  He is tolerating a regular diet and having normal bowel and bladder function.  He presently is medically stable and ready for discharge home.    Recent vital signs:  Filed Vitals:   10/29/12 0459  BP: 113/53  Pulse: 73  Temp: 97.9 F (36.6 C)  Resp: 20    Recent laboratory studies:  CBC: Recent Labs  10/28/12 0450  WBC 6.8  HGB 10.8*  HCT 31.1*  PLT 93*   BMET:  Recent Labs  10/28/12 0450  NA 132*  K 3.4*  CL 88*  CO2 36*  GLUCOSE 98  BUN 30*  CREATININE 0.92  CALCIUM 9.3    PT/INR: No results found for this basename: LABPROT, INR,  in the last 72 hours   Discharge Medications:     Medication List    STOP taking these medications       losartan 25 MG tablet  Commonly known as:  COZAAR      TAKE these medications       COMBIGAN 0.2-0.5 % ophthalmic solution  Generic drug:  brimonidine-timolol  Place 1 drop into the right eye every 12 (twelve) hours.     dorzolamide 2 % ophthalmic solution  Commonly known as:  TRUSOPT  Place 1 drop into the right eye 2 (two) times daily.     [employer] UNILET LANCETS 30G Misc  by Does not apply route.     fish oil-omega-3 fatty acids 1000 MG capsule  Take 1 g by mouth daily.     fluticasone 50 MCG/ACT nasal spray  Commonly known as:  FLONASE  Place 1 spray into the nose daily as needed for allergies.     furosemide 40 MG tablet  Commonly known as:  LASIX  Take 1.5 tablets (60 mg total) by mouth 2 (two) times daily.     insulin lispro 100 UNIT/ML injection  Commonly known as:  HUMALOG  Inject 10-20 Units into the skin daily as needed for high blood sugar.      LANTUS 100 UNIT/ML injection  Generic drug:  insulin glargine  Inject 20-70 Units into the skin at bedtime.     loratadine 10 MG tablet  Commonly known as:  CLARITIN  Take 10  mg by mouth daily as needed for allergies.     metoprolol 50 MG tablet  Commonly known as:  LOPRESSOR  Take 1 tablet (50 mg total) by mouth 2 (two) times daily.     multivitamin with minerals tablet  Take 1 tablet by mouth daily.     nitroGLYCERIN 0.4 MG SL tablet  Commonly known as:  NITROSTAT  Place 0.4 mg under the tongue every 5 (five) minutes as needed for chest pain.     OSTEO BI-FLEX TRIPLE STRENGTH PO  Take 1 tablet by mouth daily.     oxyCODONE-acetaminophen 5-325 MG per tablet  Commonly known as:  PERCOCET/ROXICET  Take 1-2 tablets by mouth every 4 (four) hours as needed for pain.     potassium gluconate 595 MG Tabs tablet  Take 595 mg by mouth daily.     Saw Palmetto 450 MG Caps  Take 450 mg by mouth 2 (two) times daily.     simvastatin 40 MG tablet  Commonly known as:  ZOCOR  Take 40 mg by mouth every evening.     SYSTANE ULTRA OP  Place 1 drop into the left eye every morning.     travoprost (benzalkonium) 0.004 % ophthalmic solution  Commonly known as:  TRAVATAN  Place 1 drop into both eyes at bedtime.     VITAMIN-B COMPLEX PO  Take 1 tablet by mouth daily.         Discharge Instructions:  The patient is to refrain from driving, heavy lifting or strenuous activity.  May shower daily and clean incisions with soap and water.  May resume regular diet.   Follow Up: Follow-up Information   Follow up with Alleen Borne, MD. (Office will contact you with an appointment)    Contact information:   [address] [place] [address] [phone]       Follow up with Lewayne Bunting, MD. (Follow up as directed)    Contact information:   [address] [place] [address] [phone]            Adella Hare 10/29/2012, 8:01  AM

## 2012-10-29 NOTE — Progress Notes (Signed)
Physical Therapy Treatment Patient Details Name: Louis Acosta MRN: [id number] DOB: 05-22-1931 Today's Date: 10/29/2012 Time: 4132-4401 PT Time Calculation (min): 23 min  PT Assessment / Plan / Recommendation  History of Present Illness Left Ventricular Dysfunction/ Congestive Heart Failure, now s/p revision on pacing placement   PT Comments   Patient demonstrates continuous improvements in functional mobility. Able to ambulate with conversation today on 3 liters with O2 remaining above 92%.  Spoke with patient at length regarding activities for LE edema control as well as encouraged use of IS.  Will address stair negotiation tomorrow. Patient will most likely benefit from home O2 on 3 liters for ambulation (less than 3 liters patient desaturates into the low to mid 80s).  Will continue to see as indicated and progress activity as tolerated.   Follow Up Recommendations  Home health PT;Supervision/Assistance - 24 hour     Does the patient have the potential to tolerate intense rehabilitation     Barriers to Discharge        Equipment Recommendations  None recommended by PT    Recommendations for Other Services    Frequency Min 4X/week   Progress towards PT Goals Progress towards PT goals: Progressing toward goals  Plan Current plan remains appropriate    Precautions / Restrictions Restrictions Weight Bearing Restrictions: No   Pertinent Vitals/Pain No pain at this time, SpO2 >92% on 3 liters (desaturation with less than 3 liters)    Mobility  Bed Mobility Bed Mobility: Not assessed Transfers Transfers: Sit to Stand;Stand to Sit Sit to Stand: 5: Supervision;From chair/3-in-1 Stand to Sit: 5: Supervision;To chair/3-in-1 Details for Transfer Assistance: Better postioning and ability today despite increased LE edema Ambulation/Gait Ambulation/Gait Assistance: 5: Supervision Ambulation Distance (Feet): 360 Feet Assistive device: Rolling walker Ambulation/Gait Assistance  Details: VCs for upright posture upon fatigue Gait Pattern: Step-through pattern;Decreased stride length;Antalgic;Trunk flexed;Wide base of support Gait velocity: decreased General Gait Details: steady with ambulation using rw    Exercises     PT Diagnosis: Difficulty walking;Generalized weakness  PT Problem List: Decreased strength;Decreased range of motion;Decreased activity tolerance;Decreased balance;Decreased mobility PT Treatment Interventions: DME instruction;Gait training;Stair training;Functional mobility training;Therapeutic activities;Therapeutic exercise;Balance training;Patient/family education   PT Goals (current goals can now be found in the care plan section) Acute Rehab PT Goals Patient Stated Goal: to go home PT Goal Formulation: With patient Time For Goal Achievement: 11/09/12 Potential to Achieve Goals: Good  Visit Information  Last PT Received On: 10/29/12 Assistance Needed: +1 History of Present Illness: Left Ventricular Dysfunction/ Congestive Heart Failure, now s/p revision on pacing placement    Subjective Data  Subjective: I just want to get back home Patient Stated Goal: to go home   Cognition  Cognition Arousal/Alertness: Awake/alert Behavior During Therapy: WFL for tasks assessed/performed Overall Cognitive Status: Within Functional Limits for tasks assessed    Balance  Balance Balance Assessed: Yes High Level Balance High Level Balance Activites: Side stepping;Backward walking;Direction changes;Turns High Level Balance Comments: Improved stability noted  End of Session PT - End of Session Equipment Utilized During Treatment: Gait belt;Oxygen Activity Tolerance: Patient tolerated treatment well Patient left: in chair;with call bell/phone within reach;with family/visitor present Nurse Communication: Mobility status   GP     Fabio Asa 10/29/2012, 9:10 AM Charlotte Crumb, PT DPT  [phone]

## 2012-10-29 NOTE — Progress Notes (Addendum)
       [address]       [place] [address]             [phone]          5 Days Post-Op Procedure(s) (LRB): THORACOTOMY MAJOR (Left) EPICARDIAL PACING LEAD PLACEMENT (N/A)  Subjective: Feels ok this am, no specific complaints.   Objective: Vital signs in last 24 hours: Patient Vitals for the past 24 hrs:  BP Temp Temp src Pulse Resp SpO2 Weight  10/29/12 0459 113/53 mmHg 97.9 F (36.6 C) Oral 73 20 96 % 220 lb 8 oz (100.018 kg)  10/28/12 2029 121/66 mmHg 98.4 F (36.9 C) Oral 80 18 96 % -  10/28/12 1239 115/72 mmHg 98.1 F (36.7 C) Oral 79 16 96 % -  10/28/12 1223 - 98 F (36.7 C) Oral - - - -  10/28/12 1000 - - - 83 18 94 % -  10/28/12 0900 - - - 75 17 96 % -  10/28/12 0809 138/66 mmHg 97.7 F (36.5 C) Oral - - - -  10/28/12 0800 - - - 73 27 97 % -   Current Weight  10/29/12 220 lb 8 oz (100.018 kg)     Intake/Output from previous day: 08/11 0701 - 08/12 0700 In: 483 [P.O.:480; I.V.:3] Out: 1275 [Urine:1275]    PHYSICAL EXAM:  Heart: RRR Lungs: Decreased BS in bases Wound: Clean and dry     Lab Results: CBC: Recent Labs  10/28/12 0450  WBC 6.8  HGB 10.8*  HCT 31.1*  PLT 93*   BMET:  Recent Labs  10/28/12 0450  NA 132*  K 3.4*  CL 88*  CO2 36*  GLUCOSE 98  BUN 30*  CREATININE 0.92  CALCIUM 9.3    PT/INR: No results found for this basename: LABPROT, INR,  in the last 72 hours  CXR: Findings: Stable position of biventricular and epicardial cardiac  rhythm maintenance device in the left chest. Stable cardiac and  mediastinal contours. Atherosclerotic calcifications noted in the  aorta. Improving pulmonary vascular congestion. Persistent left  pleural effusion versus pleural thickening and associated linear  atelectasis. No acute osseous abnormality. No pneumothorax.  IMPRESSION:  1. Improving pulmonary vascular congestion.  2. Persistent pleural thickening versus small pleural fluid on the  left with  associated atelectasis.   Assessment/Plan: S/P Procedure(s) (LRB): THORACOTOMY MAJOR (Left) EPICARDIAL PACING LEAD PLACEMENT (N/A) CV- HR, BP stable. Pulm- sats stable on 3L. (was on home O2 prior to admission). Deconditioning- progressing well with mobility.  Will arrange for HHPT. Disp- pt stays at home with his wife, who has Alzheimer's, but he has family who will be available to assist post-d/c.  He wants to go home.  Will d/w MD- possible d/c later today vs in am.   LOS: 5 days    COLLINS,GINA H 10/29/2012  Fluid overloaded- needs IV lasix today BUN 30 recheck Bmet in AM Prob home tomorrow

## 2012-10-30 ENCOUNTER — Inpatient Hospital Stay (HOSPITAL_COMMUNITY): Payer: Medicare Other

## 2012-10-30 LAB — BASIC METABOLIC PANEL
BUN: 23 mg/dL (ref 6–23)
CO2: 39 mEq/L — ABNORMAL HIGH (ref 19–32)
Calcium: 9.1 mg/dL (ref 8.4–10.5)
Chloride: 90 mEq/L — ABNORMAL LOW (ref 96–112)
Creatinine, Ser: 0.86 mg/dL (ref 0.50–1.35)
GFR calc Af Amer: >90 mL/min (ref >90)
GFR calc non Af Amer: 80 mL/min — ABNORMAL LOW (ref >90)
Glucose, Bld: 94 mg/dL (ref 70–99)
Potassium: 3.4 mEq/L — ABNORMAL LOW (ref 3.5–5.1)
Sodium: 135 mEq/L (ref 135–145)

## 2012-10-30 LAB — GLUCOSE, CAPILLARY
Glucose-Capillary: 111 mg/dL — ABNORMAL HIGH (ref 70–99)
Glucose-Capillary: 107 mg/dL — ABNORMAL HIGH (ref 70–99)
Glucose-Capillary: 93 mg/dL (ref 70–99)
Glucose-Capillary: 145 mg/dL — ABNORMAL HIGH (ref 70–99)

## 2012-10-30 MED ORDER — OXYCODONE-ACETAMINOPHEN 5-325 MG PO TABS: 1.0000 | ORAL_TABLET | ORAL | Status: DC | PRN

## 2012-10-30 NOTE — Progress Notes (Signed)
Physical Therapy Treatment Patient Details Name: Louis Acosta MRN: [id number] DOB: Jan 02, 1932 Today's Date: 10/30/2012 Time: 4034-7425 PT Time Calculation (min): 24 min  PT Assessment / Plan / Recommendation  History of Present Illness Left Ventricular Dysfunction/ Congestive Heart Failure, now s/p revision on pacing placement   PT Comments   Pt demonstrates improvements in activity tolerance and mobility. Ambulated on 3 liters increased distance and was able to perform stair negotiation with minimal assist for stability. Feel patient will be safe for mobility at home with HHPT follow up upon discharge.   Follow Up Recommendations  Home health PT;Supervision/Assistance - 24 hour           Equipment Recommendations  None recommended by PT       Frequency Min 4X/week   Progress towards PT Goals  Progressing towards goals  Plan Current plan remains appropriate    Precautions / Restrictions Restrictions Weight Bearing Restrictions: No   Pertinent Vitals/Pain No pain at this time, 3 liters O2 for ambulation    Mobility  Bed Mobility Bed Mobility: Not assessed Transfers Transfers: Sit to Stand;Stand to Sit Sit to Stand: 5: Supervision;From chair/3-in-1 Stand to Sit: 5: Supervision;To chair/3-in-1 Details for Transfer Assistance: Better postioning and ability today despite increased LE edema Ambulation/Gait Ambulation/Gait Assistance: 5: Supervision Ambulation Distance (Feet): 440 Feet Assistive device: Rolling walker Ambulation/Gait Assistance Details: Continues to need VCs for upright posture Gait Pattern: Step-through pattern;Decreased stride length;Antalgic;Trunk flexed;Wide base of support Gait velocity: decreased General Gait Details: steady with ambulation using rw Stairs: Yes Stairs Assistance: 4: Min assist Stairs Assistance Details (indicate cue type and reason): good technique and sequencing, no difficulties to perform, assist for stability Stair Management  Technique: One rail Right;Step to pattern;Backwards Number of Stairs: 4      PT Goals (current goals can now be found in the care plan section) Acute Rehab PT Goals Patient Stated Goal: to go home PT Goal Formulation: With patient Time For Goal Achievement: 11/09/12 Potential to Achieve Goals: Good  Visit Information  Last PT Received On: 10/30/12 Assistance Needed: +1 History of Present Illness: Left Ventricular Dysfunction/ Congestive Heart Failure, now s/p revision on pacing placement    Subjective Data  Subjective: I just want to get back home Patient Stated Goal: to go home   Cognition  Cognition Arousal/Alertness: Awake/alert Behavior During Therapy: WFL for tasks assessed/performed Overall Cognitive Status: Within Functional Limits for tasks assessed    Balance  Balance Balance Assessed: Yes High Level Balance High Level Balance Activites: Side stepping;Backward walking;Direction changes;Turns High Level Balance Comments: Improved stability noted  End of Session PT - End of Session Equipment Utilized During Treatment: Gait belt;Oxygen Activity Tolerance: Patient tolerated treatment well Patient left: in chair;with call bell/phone within reach;with family/visitor present Nurse Communication: Mobility status   GP     Fabio Asa 10/30/2012, 9:27 AM Charlotte Crumb, PT DPT  [phone]

## 2012-10-30 NOTE — Progress Notes (Addendum)
       [address]       [place] [address]             [phone]          6 Days Post-Op Procedure(s) (LRB): THORACOTOMY MAJOR (Left) EPICARDIAL PACING LEAD PLACEMENT (N/A)  Subjective: Stable night, no complaints.   Objective: Vital signs in last 24 hours: Patient Vitals for the past 24 hrs:  BP Temp Temp src Pulse Resp SpO2  10/30/12 0330 105/65 mmHg 98.8 F (37.1 C) Oral 69 18 92 %  10/29/12 2008 122/63 mmHg 99.6 F (37.6 C) Oral 79 18 96 %  10/29/12 1315 126/72 mmHg 98.6 F (37 C) Oral 75 18 95 %   Current Weight  10/29/12 220 lb 8 oz (100.018 kg)     Intake/Output from previous day: 08/12 0701 - 08/13 0700 In: 720 [P.O.:720] Out: 2550 [Urine:2550]  CBGs 105-111-107-94-93    PHYSICAL EXAM:  Heart: RRR Lungs: Rare basilar crackles Wound: Clean and dry Extremities: Trace LE edema    Lab Results: CBC: Recent Labs  10/28/12 0450  WBC 6.8  HGB 10.8*  HCT 31.1*  PLT 93*   BMET:  Recent Labs  10/28/12 0450 10/30/12 0430  NA 132* 135  K 3.4* 3.4*  CL 88* 90*  CO2 36* 39*  GLUCOSE 98 94  BUN 30* 23  CREATININE 0.92 0.86  CALCIUM 9.3 9.1    PT/INR: No results found for this basename: LABPROT, INR,  in the last 72 hours    Assessment/Plan: S/P Procedure(s) (LRB): THORACOTOMY MAJOR (Left) EPICARDIAL PACING LEAD PLACEMENT (N/A) CV- stable. DM- sugars controlled. Pulm- sats >90% on 3L (was on home O2). CHF/vol overload- diuresed well yesterday, BUN back down. Wt not done, but edema significantly improved.  Will resume po Lasix. Deconditioning- continue PT. Home today if ok with MD. Instructions reviewed with patient.   LOS: 6 days    COLLINS,GINA H 10/30/2012  Feels well today Respiratory  status stable, on home o2 PTA I have seen and examined Louis Acosta and agree with the above assessment  and plan.  Delight Ovens MD Beeper [phone] Office [phone] 10/30/2012 10:12 AM

## 2012-11-04 ENCOUNTER — Encounter: Payer: Self-pay | Admitting: Internal Medicine

## 2012-11-04 ENCOUNTER — Ambulatory Visit (INDEPENDENT_AMBULATORY_CARE_PROVIDER_SITE_OTHER): Payer: Medicare Other | Admitting: *Deleted

## 2012-11-04 DIAGNOSIS — Z950 Presence of cardiac pacemaker: Secondary | ICD-10-CM

## 2012-11-04 DIAGNOSIS — I5022 Chronic systolic (congestive) heart failure: Secondary | ICD-10-CM

## 2012-11-04 LAB — PACEMAKER DEVICE OBSERVATION
BAMS-0001: 150 {beats}/min
BAMS-0003: 80 {beats}/min
BATTERY VOLTAGE: 2.9178 V
LV LEAD IMPEDENCE PM: 300 Ohm
RV LEAD IMPEDENCE PM: 400 Ohm
RV LEAD THRESHOLD: 1 V
VENTRICULAR PACING PM: 98

## 2012-11-04 NOTE — Progress Notes (Signed)
Pt seen in device clinic for follow up of recent LV epicardial lead revision w/ Dr. Laneta Simmers.  Wounds (4) well healed.  No redness, swelling, or edema.  Steri-strips removed prior to arrival (pt states they were removed during 16 day hospitalization).   Device interrogated and found to be functioning normally.  Changed LV output from 2.5V to 2.0V, LV pulse width from 0.75ms to 0.39ms, RV pulse width from 0.58ms to 0.84ms, & increased V sensitivity from 4.30mV to 3.39mV. See PaceArt for full details.  Prior to revision, originally scheduled for [facility] 01/13/13 & ROV w/ Dr. Ladona Ridgel July/2015; subject to change.  Louis Acosta 11/04/2012 10:34 AM

## 2012-11-08 ENCOUNTER — Telehealth: Payer: Self-pay | Admitting: *Deleted

## 2012-11-08 NOTE — Telephone Encounter (Signed)
Scheduled 3 mo f/u appt due to LV epicardial lead revision by Dr. Laneta Simmers Estella Husk  ROV w/ Dr. Ladona Ridgel 02/04/13 @ 3:30pm

## 2012-11-13 DIAGNOSIS — I5020 Unspecified systolic (congestive) heart failure: Secondary | ICD-10-CM

## 2012-11-19 ENCOUNTER — Other Ambulatory Visit: Payer: Self-pay | Admitting: *Deleted

## 2012-11-19 DIAGNOSIS — I509 Heart failure, unspecified: Secondary | ICD-10-CM

## 2012-11-20 ENCOUNTER — Encounter: Payer: Self-pay | Admitting: Surgery

## 2012-11-20 ENCOUNTER — Ambulatory Visit
Admission: RE | Admit: 2012-11-20 | Discharge: 2012-11-20 | Disposition: A | Discharge status: Home / Self Care | Payer: Medicare Other | Source: Ambulatory Visit | Attending: Surgery | Admitting: Surgery

## 2012-11-20 ENCOUNTER — Ambulatory Visit (INDEPENDENT_AMBULATORY_CARE_PROVIDER_SITE_OTHER): Payer: Self-pay | Admitting: Surgery

## 2012-11-20 VITALS — BP 112/69 | HR 65 | Resp 16 | Ht 70.0 in | Wt 217.0 lb

## 2012-11-20 DIAGNOSIS — I509 Heart failure, unspecified: Secondary | ICD-10-CM

## 2012-11-20 DIAGNOSIS — Z09 Encounter for follow-up examination after completed treatment for conditions other than malignant neoplasm: Secondary | ICD-10-CM

## 2012-11-20 DIAGNOSIS — Z8679 Personal history of other diseases of the circulatory system: Secondary | ICD-10-CM

## 2012-11-20 DIAGNOSIS — I5022 Chronic systolic (congestive) heart failure: Secondary | ICD-10-CM

## 2012-11-20 NOTE — Progress Notes (Signed)
[address]       [name] [address]             [phone]        HPI:  Patient returns for routine postoperative follow-up having undergone left thoracotomy for insertion of 2 left ventricular epicardial pacing leads and revision of his biventricular pacing system on 10/24/2012. The patient's early postoperative recovery while in the hospital was notable for a slow but uneventful postoperative course. Since hospital discharge the patient reports that he has been continuing to improve. He was sent home on oxygen 3.5 L per minute and is gradually weaning that down to 2 L per minute. He just started walking daily and is having no chest pain or shortness of breath. He did develop increased swelling in his lower extremities since discharge and his Lasix was increased to 80 mg twice daily with improvement. His weight is now decreasing. He feels like his energy level is improving.   Current Outpatient Prescriptions  Medication Sig Dispense Refill  . acetaminophen (TYLENOL) 650 MG CR tablet Take 650 mg by mouth every 8 (eight) hours as needed for pain.      . B Complex Vitamins (VITAMIN-B COMPLEX PO) Take 1 tablet by mouth daily.       . brimonidine-timolol (COMBIGAN) 0.2-0.5 % ophthalmic solution Place 1 drop into the right eye every 12 (twelve) hours.      . dorzolamide (TRUSOPT) 2 % ophthalmic solution Place 1 drop into the right eye 2 (two) times daily.       Marland Kitchen [employer] UNILET LANCETS 30G MISC by Does not apply route.       . fish oil-omega-3 fatty acids 1000 MG capsule Take 1 g by mouth daily.       . fluticasone (FLONASE) 50 MCG/ACT nasal spray Place 1 spray into the nose daily as needed for allergies.       . furosemide (LASIX) 40 MG tablet Take 80 mg by mouth 2 (two) times daily.      . insulin lispro (HUMALOG) 100 UNIT/ML injection Inject 10-20 Units into the skin daily as needed for high blood sugar.       Marland Kitchen LANTUS 100 UNIT/ML injection Inject 20-70 Units  into the skin at bedtime.       Marland Kitchen loratadine (CLARITIN) 10 MG tablet Take 10 mg by mouth daily as needed for allergies.       . metoprolol (LOPRESSOR) 50 MG tablet Take 1 tablet (50 mg total) by mouth 2 (two) times daily.  180 tablet  3  . Misc Natural Products (OSTEO BI-FLEX TRIPLE STRENGTH PO) Take 1 tablet by mouth daily.       . Multiple Vitamins-Minerals (MULTIVITAMIN WITH MINERALS) tablet Take 1 tablet by mouth daily.       . nitroGLYCERIN (NITROSTAT) 0.4 MG SL tablet Place 0.4 mg under the tongue every 5 (five) minutes as needed for chest pain.       Bertram Gala Glycol-Propyl Glycol (SYSTANE ULTRA OP) Place 1 drop into the left eye every morning.       . potassium gluconate 595 MG TABS Take 595 mg by mouth daily.      . Saw Palmetto 450 MG CAPS Take 450 mg by mouth 2 (two) times daily.      . simvastatin (ZOCOR) 40 MG tablet Take 40 mg by mouth every evening.       . travoprost, benzalkonium, (TRAVATAN) 0.004 % ophthalmic solution  Place 1 drop into both eyes at bedtime.        No current facility-administered medications for this visit.    Physical Exam:  BP 112/69  Pulse 65  Resp 16  Ht 5\' 10"  (1.778 m)  Wt 217 lb (98.431 kg)  BMI 31.14 kg/m2  SpO2 97% He looks well. Cardiac exam shows a regular rate and rhythm with normal heart sounds. Lung exam reveals crackles at the left base. These clear with deep breathing and coughing suggesting atelectasis. The left thoracotomy incision is healing well. The pacemaker pocket incision is healing well. There is mild bilateral pedal edema.  Diagnostic Tests:  *RADIOLOGY REPORT*  Clinical Data: History of heart surgery. Shortness of breath.  CHEST - 2 VIEW  Comparison: 10/30/2012.  Findings: Biventricular pacer / AICD is in place. Epicardial leads  in place. Overall appearance without significant change.  Cardiomegaly.  Left pleural thickening / scarring without significant change.  Difficult to exclude a loculated left-sided  pleural fluid given  this appearance.  Apical pleural thickening without pneumothorax.  Pulmonary vascular prominence most notable centrally.  Tiny granuloma right upper lung. Superior to this level subtle  opacity may represent confluence of shadows although different than  on the prior exam. Attention to this on follow-up.  Calcified tortuous aorta.  Degenerative changes thoracic and lumbar spine.  IMPRESSION:  Biventricular pacer / AICD is in place. Epicardial leads in place.  Overall appearance without significant change.  Cardiomegaly.  Left pleural thickening / scarring without significant change.  Difficult to exclude a loculated left-sided pleural fluid given  this appearance.  Pulmonary vascular prominence most notable centrally.  Tiny granuloma right upper lung. Superior to this level subtle  opacity may represent confluence of shadows although different than  on the prior exam. Attention to this on follow-up.  This is a call report.  Original Report Authenticated By: Lacy Duverney, M.D.    Impression:  Overall I think he is making good progress following his surgery. He is symptomatically improving since revision of his pacing system. His oxygen saturation is 97% on 2 L today. He will continue to wean oxygen as tolerated at home. His chest x-ray shows some density at the left base which I think is probably related to his markedly enlarged heart and atelectasis. There is no sign of pleural effusion on his lateral chest x-ray.  Plan:  He has a followup appointment over the next few weeks with Dr. Kevan Ny and will see Dr. Ladona Ridgel in October. He doesn't have a follow up appt with Dr. Eldridge Dace until next spring. I will see him back if he develops any problems with his incisions.

## 2012-12-03 ENCOUNTER — Encounter: Payer: Self-pay | Admitting: Internal Medicine

## 2012-12-23 ENCOUNTER — Ambulatory Visit
Admission: RE | Admit: 2012-12-23 | Discharge: 2012-12-23 | Disposition: A | Discharge status: Home / Self Care | Payer: Medicare Other | Source: Ambulatory Visit | Attending: Internal Medicine | Admitting: Internal Medicine

## 2012-12-23 ENCOUNTER — Other Ambulatory Visit: Payer: Self-pay | Admitting: Internal Medicine

## 2012-12-23 DIAGNOSIS — I509 Heart failure, unspecified: Secondary | ICD-10-CM

## 2013-01-14 ENCOUNTER — Other Ambulatory Visit: Payer: Self-pay | Admitting: Sports Medicine

## 2013-01-14 DIAGNOSIS — M545 Low back pain: Secondary | ICD-10-CM

## 2013-01-14 DIAGNOSIS — M4800 Spinal stenosis, site unspecified: Secondary | ICD-10-CM

## 2013-01-15 ENCOUNTER — Ambulatory Visit
Admission: RE | Admit: 2013-01-15 | Discharge: 2013-01-15 | Disposition: A | Discharge status: Home / Self Care | Payer: Medicare Other | Source: Ambulatory Visit | Attending: Sports Medicine | Admitting: Sports Medicine

## 2013-01-15 VITALS — BP 142/75 | HR 74

## 2013-01-15 DIAGNOSIS — I10 Essential (primary) hypertension: Secondary | ICD-10-CM

## 2013-01-15 DIAGNOSIS — I208 Other forms of angina pectoris: Secondary | ICD-10-CM

## 2013-01-15 DIAGNOSIS — Z950 Presence of cardiac pacemaker: Secondary | ICD-10-CM

## 2013-01-15 DIAGNOSIS — M4800 Spinal stenosis, site unspecified: Secondary | ICD-10-CM

## 2013-01-15 DIAGNOSIS — M545 Low back pain, unspecified: Secondary | ICD-10-CM

## 2013-01-15 DIAGNOSIS — I2089 Other forms of angina pectoris: Secondary | ICD-10-CM

## 2013-01-15 DIAGNOSIS — I5022 Chronic systolic (congestive) heart failure: Secondary | ICD-10-CM

## 2013-01-15 MED ORDER — METHYLPREDNISOLONE ACETATE 40 MG/ML INJ SUSP (RADIOLOG
120.0000 mg | Freq: Once | INTRAMUSCULAR | Status: AC
  Administered 2013-01-15: 120 mg via EPIDURAL

## 2013-01-15 MED ORDER — IOHEXOL 180 MG/ML  SOLN
1.0000 mL | Freq: Once | INTRAMUSCULAR | Status: AC | PRN
  Administered 2013-01-15: 1 mL via EPIDURAL

## 2013-01-23 ENCOUNTER — Other Ambulatory Visit: Payer: Self-pay

## 2013-02-04 ENCOUNTER — Encounter: Payer: Self-pay | Admitting: Internal Medicine

## 2013-02-04 ENCOUNTER — Ambulatory Visit (INDEPENDENT_AMBULATORY_CARE_PROVIDER_SITE_OTHER): Payer: Medicare Other | Admitting: Internal Medicine

## 2013-02-04 VITALS — BP 124/69 | HR 66 | Ht 71.0 in | Wt 217.4 lb

## 2013-02-04 DIAGNOSIS — Z950 Presence of cardiac pacemaker: Secondary | ICD-10-CM

## 2013-02-04 DIAGNOSIS — I5022 Chronic systolic (congestive) heart failure: Secondary | ICD-10-CM

## 2013-02-04 DIAGNOSIS — I442 Atrioventricular block, complete: Secondary | ICD-10-CM

## 2013-02-04 LAB — MDC_IDC_ENUM_SESS_TYPE_INCLINIC
Battery Remaining Longevity: 39.6 mo
Implantable Pulse Generator Model: 3210
Lead Channel Impedance Value: 300 Ohm
Lead Channel Impedance Value: 425 Ohm
Lead Channel Pacing Threshold Amplitude: 1.25 V
Lead Channel Pacing Threshold Pulse Width: 0.5 ms
Lead Channel Pacing Threshold Pulse Width: 0.6 ms
Lead Channel Sensing Intrinsic Amplitude: 8.3 mV
Lead Channel Setting Pacing Amplitude: 2.5 V
Lead Channel Setting Pacing Pulse Width: 0.6 ms
Lead Channel Setting Pacing Pulse Width: 0.6 ms

## 2013-02-04 NOTE — Patient Instructions (Signed)
Your physician wants you to follow-up in: 10/2013 with Dr Court Joy will receive a reminder letter in the mail two months in advance. If you don't receive a letter, please call our office to schedule the follow-up appointment.  Remote monitoring is used to monitor your Pacemaker or ICD from home. This monitoring reduces the number of office visits required to check your device to one time per year. It allows [place] to keep an eye on the functioning of your device to ensure it is working properly. You are scheduled for a device check from home on 05/08/13. You may send your transmission at any time that day. If you have a wireless device, the transmission will be sent automatically. After your physician reviews your transmission, you will receive a postcard with your next transmission date.

## 2013-02-04 NOTE — Assessment & Plan Note (Signed)
St. Jude biventricular pacemaker is working normally. We'll plan to recheck in several months.

## 2013-02-04 NOTE — Assessment & Plan Note (Signed)
His chronic systolic heart failure is class IIB. He'll continue his current medical therapy, and maintain a low-sodium diet.

## 2013-02-04 NOTE — Progress Notes (Signed)
HPI [name] returns today for followup. He is a 79 year old man with a history of complete heart block, chronic systolic dysfunction, chronic systolic heart failure ejection fraction 15%, who developed a isolated right internal jugular skin infection involving the a portion of his pacemaker lead. He underwent attempted re-implantation of an LV lead but his anatomy was unsuitable and subsequently was referred to Dr. Laneta Acosta where he had an epicardial LV lead placed. Since then he is improved. He notes mild peripheral edema. No syncope. He has not had fevers or chills. He is on home oxygen.and Allergies  Allergen Reactions  . Clindamycin/Lincomycin Nausea And Vomiting     Current Outpatient Prescriptions  Medication Sig Dispense Refill  . acetaminophen (TYLENOL) 650 MG CR tablet Take 650 mg by mouth every 8 (eight) hours as needed for pain.      . B Complex Vitamins (VITAMIN-B COMPLEX PO) Take 1 tablet by mouth daily.       . brimonidine-timolol (COMBIGAN) 0.2-0.5 % ophthalmic solution Place 1 drop into the right eye every 12 (twelve) hours.      . dorzolamide (TRUSOPT) 2 % ophthalmic solution Place 1 drop into the right eye 2 (two) times daily.       . fish oil-omega-3 fatty acids 1000 MG capsule Take 1 g by mouth daily.       . fluticasone (FLONASE) 50 MCG/ACT nasal spray Place 1 spray into the nose daily as needed for allergies.       . furosemide (LASIX) 40 MG tablet Take 80 mg by mouth 2 (two) times daily.      . insulin lispro (HUMALOG) 100 UNIT/ML injection Inject 10-20 Units into the skin daily as needed for high blood sugar.       Marland Kitchen LANTUS 100 UNIT/ML injection Inject 20-70 Units into the skin at bedtime.       Marland Kitchen loratadine (CLARITIN) 10 MG tablet Take 10 mg by mouth daily as needed for allergies.       Marland Kitchen losartan (COZAAR) 25 MG tablet Take 1 tablet by mouth daily.      . metoprolol (LOPRESSOR) 50 MG tablet Take 1 tablet (50 mg total) by mouth 2 (two) times daily.  180 tablet  3  .  Misc Natural Products (OSTEO BI-FLEX TRIPLE STRENGTH PO) Take 1 tablet by mouth daily.       . Multiple Vitamins-Minerals (MULTIVITAMIN WITH MINERALS) tablet Take 1 tablet by mouth daily.       . nitroGLYCERIN (NITROSTAT) 0.4 MG SL tablet Place 0.4 mg under the tongue every 5 (five) minutes as needed for chest pain.       Louis Acosta (SYSTANE ULTRA OP) Place 1 drop into the left eye every morning.       . potassium gluconate 595 MG TABS Take 595 mg by mouth daily.      . Saw Palmetto 450 MG CAPS Take 450 mg by mouth 2 (two) times daily.      . simvastatin (ZOCOR) 40 MG tablet Take 40 mg by mouth every evening.       . travoprost, benzalkonium, (TRAVATAN) 0.004 % ophthalmic solution Place 1 drop into both eyes at bedtime.        No current facility-administered medications for this visit.     Past Medical History  Diagnosis Date  . Cardiomyopathy     broken 3x's  . History of fracture of nose   . Hyperlipidemia     takes Simvastatin daily  .  Arthritis   . Seasonal allergies     uses Flonase prn and takes Claritin prn  . Dysrhythmia     HX OF COMPLETE HEART BLOCK;takes Metoprolol daily  . Hypertension     takes Losartan daily  . CHF (congestive heart failure)     takes Furosemide daily  . PONV (postoperative nausea and vomiting)   . Pacemaker     St Jude  . Anginal pain     within the last 2 wks has taken Ntg 1 time  . Shortness of breath     lying and with exertion  . Confusion     short term memory loss  . Joint pain   . Chronic back pain   . Skin spots, red   . H/O hiatal hernia   . History of colon polyps   . Diverticulosis   . Enlarged prostate     takes [employer]  . Nocturia   . Diabetes mellitus     type 2;takes Lantus nightly and Humalog prn  . Glaucoma     uses several drops  . History of shingles   . History of staph infection     50+yrs ago    ROS:   All systems reviewed and negative except as noted in the HPI.   Past  Surgical History  Procedure Laterality Date  . Transthoracic echocardiogram  01/2007  . Pacemaker lead removal N/A 08/21/2012    Procedure: PACEMAKER LEAD REMOVAL;  Surgeon: Marinus Maw, MD;  Location: [facility] OR;  Service: Cardiovascular;  Laterality: N/A;  . Back surgery      x 2  . Joint replacement      right knee arthroplasty  . Insert / replace / remove pacemaker      x 3  . Colonoscopy    . Esophagogastroduodenoscopy    . Bilateral cataract surgery    . Right eye lasik    . Thoracotomy Left 10/24/2012    Procedure: THORACOTOMY MAJOR;  Surgeon: Alleen Borne, MD;  Location: [facility] OR;  Service: Thoracic;  Laterality: Left;  . Epicardial pacing lead placement N/A 10/24/2012    Procedure: EPICARDIAL PACING LEAD PLACEMENT;  Surgeon: Alleen Borne, MD;  Location: [facility] OR;  Service: Thoracic;  Laterality: N/A;  LV EPICARDIAL LEADS     No family history on file.   History   Social History  . Marital Status: Married    Spouse Name: N/A    Number of Children: N/A  . Years of Education: N/A   Occupational History  . retired    Social History Main Topics  . Smoking status: Former Smoker    Quit date: 03/20/1968  . Smokeless tobacco: Never Used  . Alcohol Use: No  . Drug Use: No  . Sexual Activity: No   Other Topics Concern  . Not on file   Social History Narrative  . No narrative on file     BP 124/69  Pulse 66  Ht 5\' 11"  (1.803 m)  Wt 217 lb 6.4 oz (98.612 kg)  BMI 30.33 kg/m2  Physical Exam:  stable appearing 79 year old man, andNAD HEENT: Unremarkable Neck:  9 cm JVD, no thyromegally, well-healed incision in the right internal jugular vein region. Back:  No CVA tenderness Lungs:  Clear except for basilar rales. HEART:  Regular rate rhythm, no murmurs, no rubs, no clicks Abd:  soft, positive bowel sounds, no organomegally, no rebound, no guarding Ext:  2 plus pulses, no edema, no cyanosis, no  clubbing Skin:  No rashes no nodules Neuro:  CN II through XII intact,  motor grossly intact   DEVICE  Normal device function.  See PaceArt for details.  His device has greater than 5 years of battery longevity.  Assess/Plan:

## 2013-04-17 ENCOUNTER — Other Ambulatory Visit: Payer: Self-pay | Admitting: Sports Medicine

## 2013-04-17 ENCOUNTER — Other Ambulatory Visit: Payer: Self-pay | Admitting: Interventional Cardiology

## 2013-04-17 DIAGNOSIS — M545 Low back pain, unspecified: Secondary | ICD-10-CM

## 2013-04-21 ENCOUNTER — Ambulatory Visit
Admission: RE | Admit: 2013-04-21 | Discharge: 2013-04-21 | Disposition: A | Discharge status: Home / Self Care | Payer: Medicare Other | Source: Ambulatory Visit | Attending: Sports Medicine | Admitting: Sports Medicine

## 2013-04-21 VITALS — BP 118/67 | HR 77

## 2013-04-21 DIAGNOSIS — M545 Low back pain, unspecified: Secondary | ICD-10-CM

## 2013-04-21 MED ORDER — IOHEXOL 180 MG/ML  SOLN
1.0000 mL | Freq: Once | INTRAMUSCULAR | Status: AC | PRN
  Administered 2013-04-21: 1 mL via EPIDURAL

## 2013-04-21 MED ORDER — METHYLPREDNISOLONE ACETATE 40 MG/ML INJ SUSP (RADIOLOG
120.0000 mg | Freq: Once | INTRAMUSCULAR | Status: AC
  Administered 2013-04-21: 120 mg via EPIDURAL

## 2013-04-21 NOTE — Discharge Instructions (Signed)

## 2013-05-08 ENCOUNTER — Ambulatory Visit (INDEPENDENT_AMBULATORY_CARE_PROVIDER_SITE_OTHER): Payer: Medicare Other | Admitting: *Deleted

## 2013-05-08 DIAGNOSIS — I442 Atrioventricular block, complete: Secondary | ICD-10-CM

## 2013-05-08 LAB — MDC_IDC_ENUM_SESS_TYPE_REMOTE
Brady Statistic AP VP Percent: 15 %
Brady Statistic AP VS Percent: 1 %
Brady Statistic AS VP Percent: 83 %
Date Time Interrogation Session: 20150219083353
Implantable Pulse Generator Model: 3210
Lead Channel Impedance Value: 290 Ohm
Lead Channel Impedance Value: 440 Ohm
Lead Channel Pacing Threshold Amplitude: 1 V
Lead Channel Pacing Threshold Amplitude: 1 V
Lead Channel Pacing Threshold Pulse Width: 0.5 ms
Lead Channel Sensing Intrinsic Amplitude: 6.1 mV
Lead Channel Setting Pacing Amplitude: 2 V
Lead Channel Setting Pacing Amplitude: 2 V
Lead Channel Setting Pacing Pulse Width: 0.6 ms
MDC IDC MSMT BATTERY REMAINING LONGEVITY: 41 mo
MDC IDC MSMT BATTERY VOLTAGE: 2.9 V
MDC IDC MSMT LEADCHNL LV PACING THRESHOLD AMPLITUDE: 1.25 V
MDC IDC MSMT LEADCHNL LV PACING THRESHOLD PULSEWIDTH: 0.6 ms
MDC IDC MSMT LEADCHNL RA IMPEDANCE VALUE: 430 Ohm
MDC IDC MSMT LEADCHNL RA SENSING INTR AMPL: 4 mV
MDC IDC MSMT LEADCHNL RV PACING THRESHOLD PULSEWIDTH: 0.6 ms
MDC IDC PG SERIAL: 2376403
MDC IDC SET LEADCHNL LV PACING AMPLITUDE: 2.5 V
MDC IDC SET LEADCHNL LV PACING PULSEWIDTH: 0.6 ms
MDC IDC SET LEADCHNL RV SENSING SENSITIVITY: 3.5 mV
MDC IDC STAT BRADY AS VS PERCENT: 1 %
MDC IDC STAT BRADY RA PERCENT PACED: 14 %

## 2013-05-14 ENCOUNTER — Encounter: Payer: Self-pay | Admitting: *Deleted

## 2013-05-29 ENCOUNTER — Encounter: Payer: Self-pay | Admitting: Internal Medicine

## 2013-06-05 ENCOUNTER — Other Ambulatory Visit: Payer: Self-pay | Admitting: *Deleted

## 2013-06-05 MED ORDER — FUROSEMIDE 40 MG PO TABS: 80.0000 mg | ORAL_TABLET | Freq: Two times a day (BID) | ORAL | Status: DC

## 2013-06-11 ENCOUNTER — Ambulatory Visit: Payer: Medicare Other | Admitting: Interventional Cardiology

## 2013-07-01 ENCOUNTER — Telehealth: Payer: Self-pay | Admitting: Interventional Cardiology

## 2013-07-01 NOTE — Telephone Encounter (Signed)
New message     Need to get lab results

## 2013-07-02 NOTE — Telephone Encounter (Signed)
Faxed last ov note to April at [facility] as she needs medication list. [facility] monitors pts heart failure.

## 2013-07-02 NOTE — Telephone Encounter (Addendum)
Lm with April Williams RN at [facility] to return call. Spoke with pt and he is okay with me speaking with April @ [facility].

## 2013-07-23 ENCOUNTER — Encounter: Payer: Self-pay | Admitting: Interventional Cardiology

## 2013-07-23 ENCOUNTER — Ambulatory Visit (INDEPENDENT_AMBULATORY_CARE_PROVIDER_SITE_OTHER): Payer: Medicare Other | Admitting: Interventional Cardiology

## 2013-07-23 VITALS — BP 127/70 | HR 74 | Ht 71.0 in | Wt 219.1 lb

## 2013-07-23 DIAGNOSIS — I10 Essential (primary) hypertension: Secondary | ICD-10-CM

## 2013-07-23 DIAGNOSIS — E782 Mixed hyperlipidemia: Secondary | ICD-10-CM

## 2013-07-23 DIAGNOSIS — I5022 Chronic systolic (congestive) heart failure: Secondary | ICD-10-CM

## 2013-07-23 DIAGNOSIS — E669 Obesity, unspecified: Secondary | ICD-10-CM

## 2013-07-23 NOTE — Patient Instructions (Signed)
Your physician recommends that you continue on your current medications as directed. Please refer to the Current Medication list given to you today.  Your physician wants you to follow-up in: 1 year with Dr. Varanasi. You will receive a reminder letter in the mail two months in advance. If you don't receive a letter, please call our office to schedule the follow-up appointment.  

## 2013-07-23 NOTE — Progress Notes (Signed)
Patient ID: Louis Acosta, male   DOB: [birth year], 80 y.o.   MRN: [id number]    [place], [place], [place]  [address] Phone: [phone] Fax:  [phone]  Date:  07/23/2013   ID:  Louis Acosta, DOB [birth year], MRN [id number]  PCP:  [name], MD      History of Present Illness: Louis Acosta is a 80 y.o. male who has decreased LV function. He has problems with balance. He has  fallen recently, after missing a step. He has lost weight. He has been watching his diet and walking. He feels good. He is hoping to get his back injected.  Cardiomyopathy:  he has more energy. blood sugar has been dropping with weight loss. c/o Leg edema occasional.  Denies : Chest pain.  Shortness of breath.  Orthopnea.  Paroxysmal nocturnal dyspnea.  Palpitations.  Syncope.   Oxygen sats dropped and he is now on supplemental oxygen.  He feels that his balance is off. He has persistent back pain and is not a surgical candidate.  Wt Readings from Last 3 Encounters:  07/23/13 219 lb 1.9 oz (99.392 kg)  02/04/13 217 lb 6.4 oz (98.612 kg)  11/20/12 217 lb (98.431 kg)     Past Medical History  Diagnosis Date  . Cardiomyopathy     broken 3x's  . History of fracture of nose   . Hyperlipidemia     takes Simvastatin daily  . Arthritis   . Seasonal allergies     uses Flonase prn and takes Claritin prn  . Dysrhythmia     HX OF COMPLETE HEART BLOCK;takes Metoprolol daily  . Hypertension     takes Losartan daily  . CHF (congestive heart failure)     takes Furosemide daily  . PONV (postoperative nausea and vomiting)   . Pacemaker     St Jude  . Anginal pain     within the last 2 wks has taken Ntg 1 time  . Shortness of breath     lying and with exertion  . Confusion     short term memory loss  . Joint pain   . Chronic back pain   . Skin spots, red   . H/O hiatal hernia   . History of colon polyps   . Diverticulosis   . Enlarged prostate     takes [place]  . Nocturia   . Diabetes mellitus     type 2;takes Lantus nightly and Humalog prn  . Glaucoma     uses several drops  . History of shingles   . History of staph infection     50+yrs ago    Current Outpatient Prescriptions  Medication Sig Dispense Refill  . acetaminophen (TYLENOL) 650 MG CR tablet Take 650 mg by mouth every 8 (eight) hours as needed for pain.      . B Complex Vitamins (VITAMIN-B COMPLEX PO) Take 1 tablet by mouth daily.       . brimonidine-timolol (COMBIGAN) 0.2-0.5 % ophthalmic solution Place 1 drop into the right eye every 12 (twelve) hours.      . dorzolamide (TRUSOPT) 2 % ophthalmic solution Place 1 drop into the right eye 2 (two) times daily.       . fish oil-omega-3 fatty acids 1000 MG capsule Take 1 g by mouth daily.       . fluticasone (FLONASE) 50 MCG/ACT nasal spray Place 1 spray into the nose daily as needed for allergies.       Marland Kitchen  furosemide (LASIX) 40 MG tablet Take 2 tablets (80 mg total) by mouth 2 (two) times daily.  396 tablet  1  . insulin lispro (HUMALOG) 100 UNIT/ML injection Inject 10-20 Units into the skin daily as needed for high blood sugar.       Marland Kitchen LANTUS 100 UNIT/ML injection Inject 20-70 Units into the skin at bedtime.       Marland Kitchen loratadine (CLARITIN) 10 MG tablet Take 10 mg by mouth daily as needed for allergies.       Marland Kitchen losartan (COZAAR) 25 MG tablet Take 1 tablet by mouth daily.      . metoprolol (LOPRESSOR) 50 MG tablet Take 1 tablet (50 mg total) by mouth 2 (two) times daily.  180 tablet  3  . Misc Natural Products (OSTEO BI-FLEX TRIPLE STRENGTH PO) Take 1 tablet by mouth daily.       . Multiple Vitamins-Minerals (MULTIVITAMIN WITH MINERALS) tablet Take 1 tablet by mouth daily.       Marland Kitchen NITROSTAT 0.4 MG SL tablet TAKE 1 TABLET UNDER THE TONGUE AS NEEDEDFOR CHEST PAIN (MAY REPEAT EVERY 5 MIN X3 DOSES, CALL 911 IF NO RELIEF.)  25 tablet  5  . Polyethyl Glycol-Propyl Glycol (SYSTANE ULTRA OP) Place 1 drop into the left eye every morning.        . potassium gluconate 595 MG TABS Take 595 mg by mouth daily.      . Saw Palmetto 450 MG CAPS Take 450 mg by mouth 2 (two) times daily.      . simvastatin (ZOCOR) 40 MG tablet Take 40 mg by mouth every evening.       . travoprost, benzalkonium, (TRAVATAN) 0.004 % ophthalmic solution Place 1 drop into both eyes at bedtime.        No current facility-administered medications for this visit.    Allergies:    Allergies  Allergen Reactions  . Clindamycin/Lincomycin Nausea And Vomiting    Social History:  The patient  reports that he quit smoking about 45 years ago. He has never used smokeless tobacco. He reports that he does not drink alcohol or use illicit drugs.   Family History:  The patient's family history is not on file.   ROS:  Please see the history of present illness.  No nausea, vomiting.  No fevers, chills.  No focal weakness.  No dysuria.   All other systems reviewed and negative.   PHYSICAL EXAM: VS:  BP 127/70  Pulse 74  Ht 5\' 11"  (1.803 m)  Wt 219 lb 1.9 oz (99.392 kg)  BMI 30.57 kg/m2 Well nourished, well developed, in no acute distress HEENT: normal Neck: no JVD, no carotid bruits Cardiac:  normal S1, S2; RRR;  Lungs:  clear to auscultation bilaterally, no wheezing, rhonchi or rales Abd: soft, nontender, no hepatomegaly Ext: no edema Skin: warm and dry Neuro:   no focal abnormalities noted  EKG:    A sensed, V paced in 11/14   ASSESSMENT AND PLAN:   Cardiomyopathy / chronic systolic heart failure Continue Losartan Potassium Tablet, 25 MG, 1 tablet, Orally, Once a day Continue Metoprolol Tartrate Tablet, 50 MG, 2 tablets in am and 1 in pm, Orally, as directed Notes: No signs of heart failure. continue current medications.  2. Obesity  Notes: He has lost a significant amount of weight through diet control. he wants to lose another 10 pounds. He has gained a little bit of weight since being less active.  HTN: BP controlled.  Continue current  medications.  Hyperlipidemia: Lipids in October 14 showed LDL 64, HDL 31, total cholesterol 148. Continue current lipid-lowering therapy.   Signed, Mina Marble, MD, [facility] 07/23/2013 12:34 PM

## 2013-07-28 ENCOUNTER — Other Ambulatory Visit: Payer: Self-pay | Admitting: Interventional Cardiology

## 2013-08-06 ENCOUNTER — Other Ambulatory Visit: Payer: Self-pay | Admitting: Sports Medicine

## 2013-08-06 DIAGNOSIS — M549 Dorsalgia, unspecified: Secondary | ICD-10-CM

## 2013-08-07 ENCOUNTER — Ambulatory Visit
Admission: RE | Admit: 2013-08-07 | Discharge: 2013-08-07 | Disposition: A | Discharge status: Home / Self Care | Payer: Medicare Other | Source: Ambulatory Visit | Attending: Sports Medicine | Admitting: Sports Medicine

## 2013-08-07 VITALS — BP 144/77 | HR 98

## 2013-08-07 DIAGNOSIS — M549 Dorsalgia, unspecified: Secondary | ICD-10-CM

## 2013-08-07 MED ORDER — METHYLPREDNISOLONE ACETATE 40 MG/ML INJ SUSP (RADIOLOG
120.0000 mg | Freq: Once | INTRAMUSCULAR | Status: AC
  Administered 2013-08-07: 120 mg via EPIDURAL

## 2013-08-07 MED ORDER — IOHEXOL 180 MG/ML  SOLN
1.0000 mL | Freq: Once | INTRAMUSCULAR | Status: AC | PRN
  Administered 2013-08-07: 1 mL via EPIDURAL

## 2013-08-12 ENCOUNTER — Encounter: Payer: Self-pay | Admitting: Internal Medicine

## 2013-08-12 ENCOUNTER — Ambulatory Visit (INDEPENDENT_AMBULATORY_CARE_PROVIDER_SITE_OTHER): Payer: Medicare Other | Admitting: *Deleted

## 2013-08-12 DIAGNOSIS — I442 Atrioventricular block, complete: Secondary | ICD-10-CM

## 2013-08-12 NOTE — Progress Notes (Signed)
Remote pacemaker transmission.   

## 2013-08-19 LAB — MDC_IDC_ENUM_SESS_TYPE_REMOTE
Battery Remaining Percentage: 53 %
Battery Voltage: 2.9 V
Brady Statistic AP VP Percent: 13 %
Brady Statistic AP VS Percent: 1 %
Brady Statistic AS VP Percent: 84 %
Brady Statistic RA Percent Paced: 12 %
Date Time Interrogation Session: 20150526071018
Implantable Pulse Generator Serial Number: 2376403
Lead Channel Impedance Value: 440 Ohm
Lead Channel Impedance Value: 440 Ohm
Lead Channel Pacing Threshold Amplitude: 1 V
Lead Channel Pacing Threshold Amplitude: 1.25 V
Lead Channel Pacing Threshold Pulse Width: 0.5 ms
Lead Channel Pacing Threshold Pulse Width: 0.6 ms
Lead Channel Sensing Intrinsic Amplitude: 12 mV
Lead Channel Sensing Intrinsic Amplitude: 4.4 mV
Lead Channel Setting Pacing Amplitude: 2 V
Lead Channel Setting Pacing Amplitude: 2 V
Lead Channel Setting Pacing Amplitude: 2.5 V
Lead Channel Setting Pacing Pulse Width: 0.6 ms
Lead Channel Setting Sensing Sensitivity: 3.5 mV
MDC IDC MSMT BATTERY REMAINING LONGEVITY: 41 mo
MDC IDC MSMT LEADCHNL LV IMPEDANCE VALUE: 290 Ohm
MDC IDC MSMT LEADCHNL LV PACING THRESHOLD PULSEWIDTH: 0.6 ms
MDC IDC MSMT LEADCHNL RV PACING THRESHOLD AMPLITUDE: 1 V
MDC IDC SET LEADCHNL LV PACING PULSEWIDTH: 0.6 ms
MDC IDC STAT BRADY AS VS PERCENT: 1.2 %

## 2013-08-29 ENCOUNTER — Encounter: Payer: Self-pay | Admitting: Cardiology

## 2013-09-24 ENCOUNTER — Other Ambulatory Visit: Payer: Self-pay | Admitting: *Deleted

## 2013-09-24 MED ORDER — METOPROLOL TARTRATE 50 MG PO TABS: 50.0000 mg | ORAL_TABLET | Freq: Two times a day (BID) | ORAL | Status: DC

## 2013-12-12 IMAGING — CR DG CHEST 2V
2 series · 2 of 2 positions shown · non-contrast
Comparison: Prior chest x-ray 10/28/2012

CLINICAL DATA: Short of breath, history of VATS

CHEST - 2 VIEW

[w chest pa]
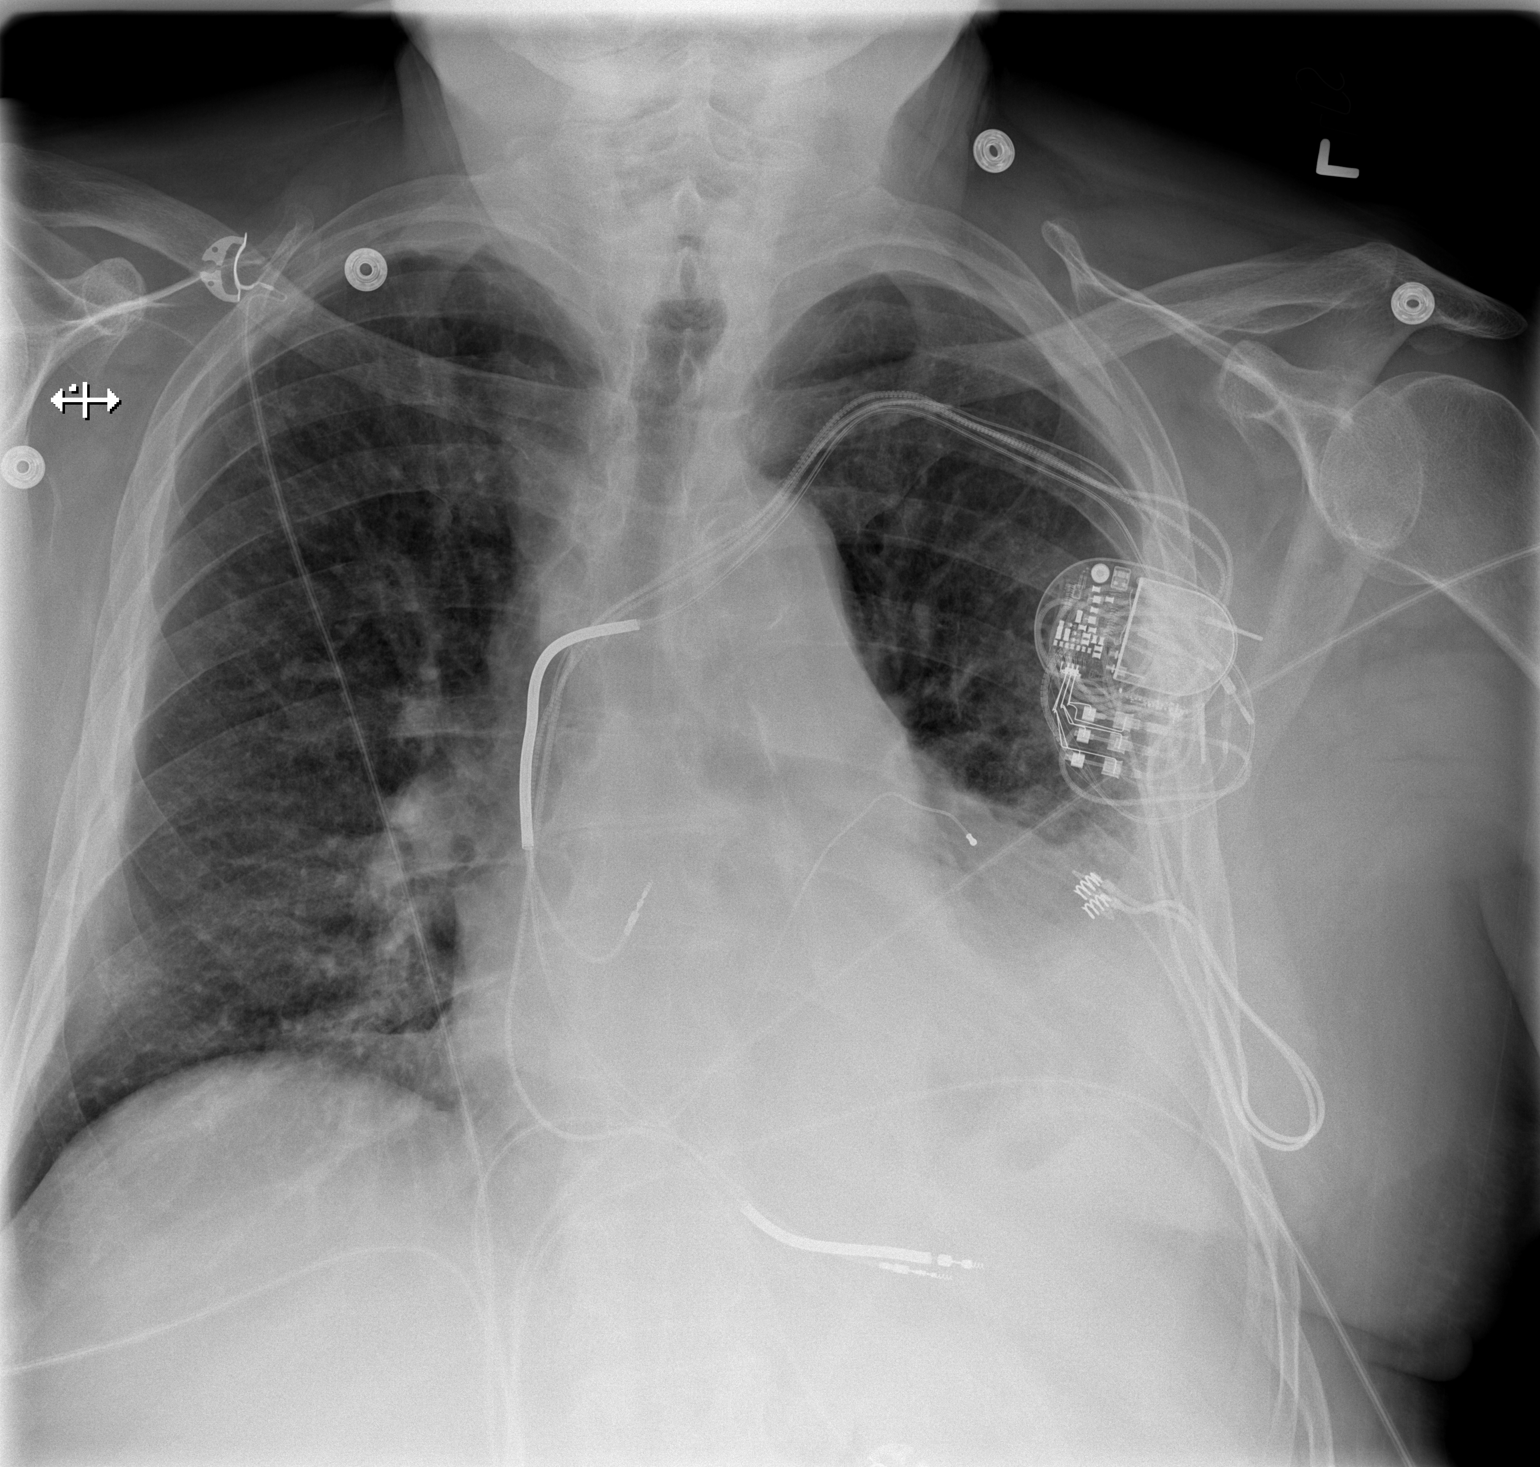

[w chest lat]
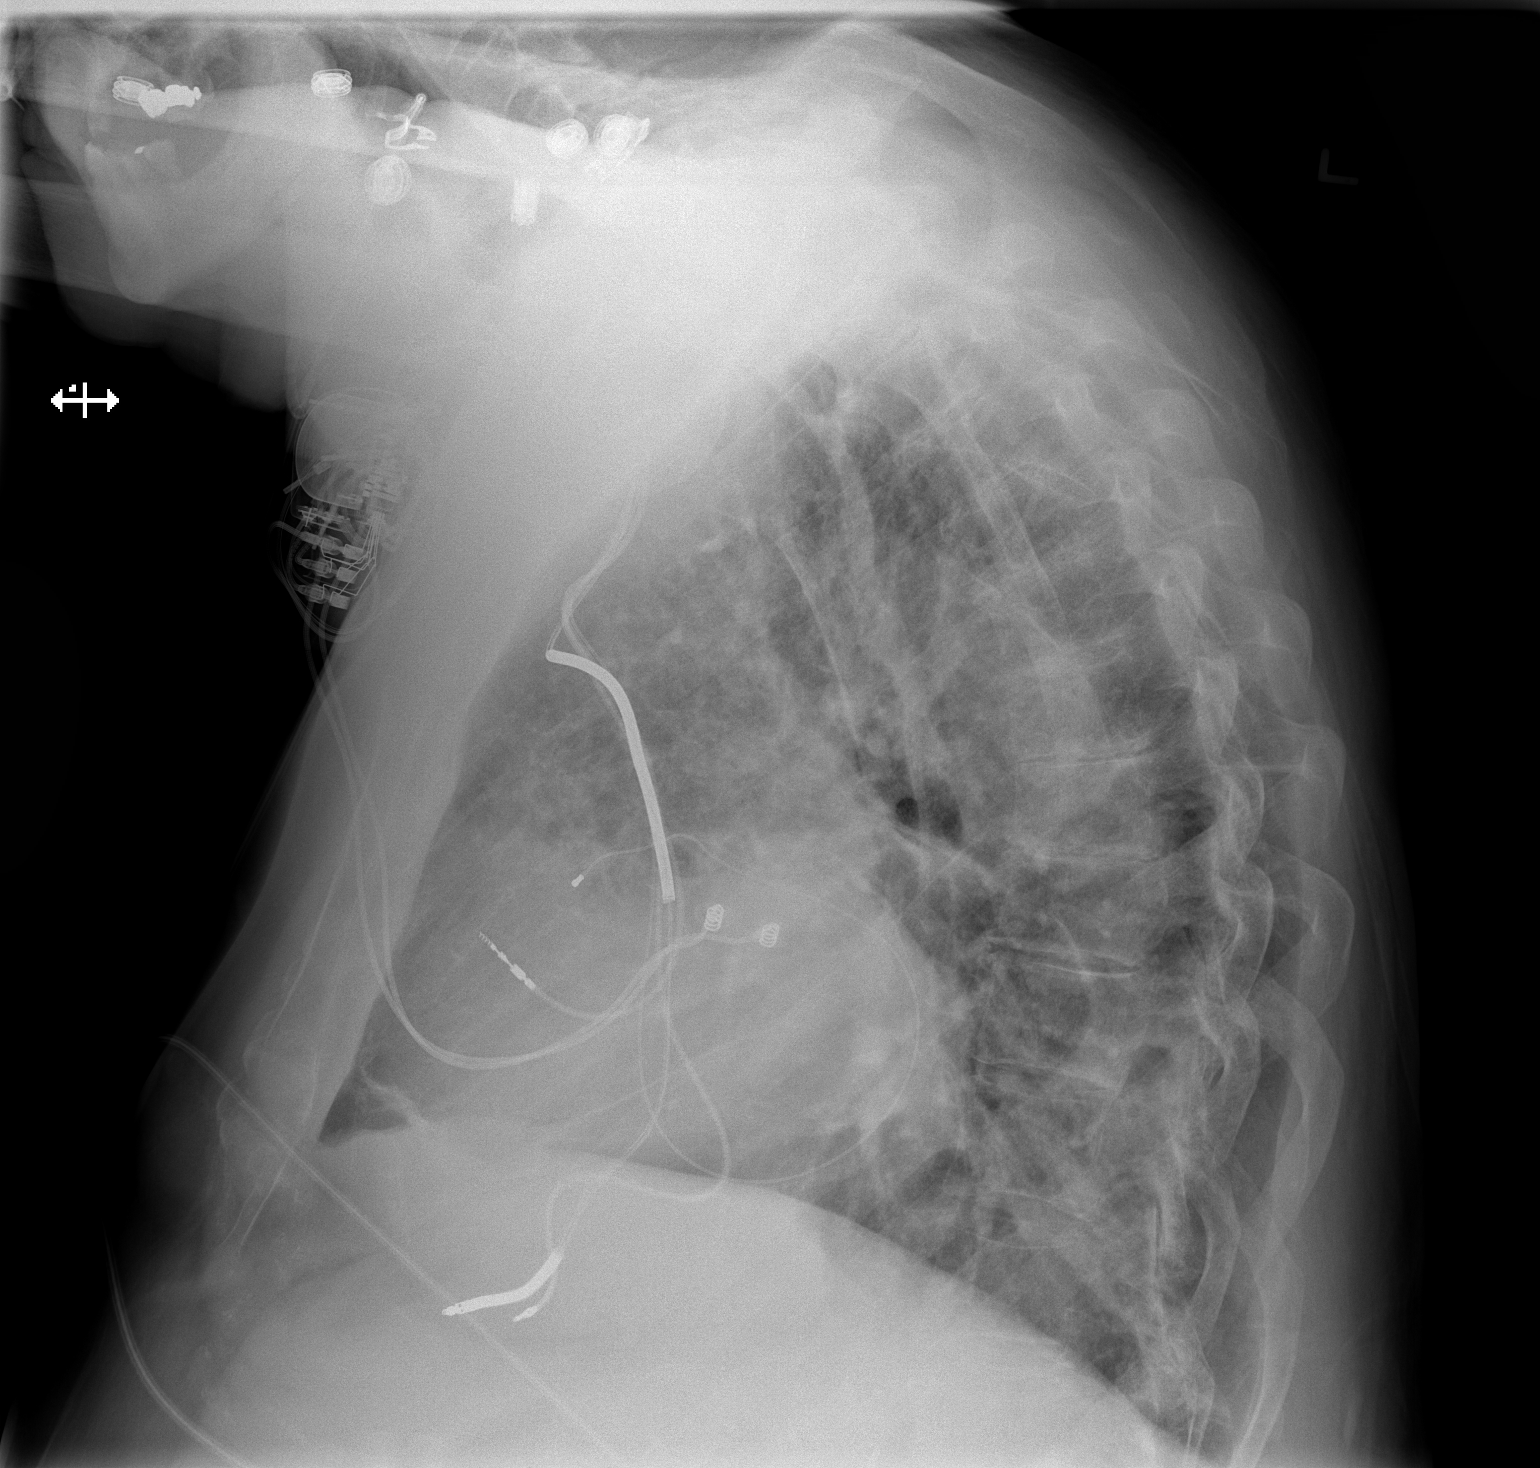

[2 of 2 positions shown; findings below may reference images not displayed]

FINDINGS: Stable position of biventricular and epicardial cardiac
rhythm maintenance device in the left chest. Stable cardiac and
mediastinal contours.  Atherosclerotic calcifications noted in the
aorta.  Improving pulmonary vascular congestion.  Persistent left
pleural effusion versus pleural thickening and associated linear
atelectasis.  No acute osseous abnormality.  No pneumothorax.
IMPRESSION: 1.  Improving pulmonary vascular congestion.
2.  Persistent pleural thickening versus small pleural fluid on the
left with associated atelectasis.

## 2013-12-13 IMAGING — CR DG CHEST 2V
2 series · 2 of 2 positions shown · non-contrast
Comparison: 10/29/2012

CLINICAL DATA: Short of breath

CHEST - 2 VIEW

[w chest pa]
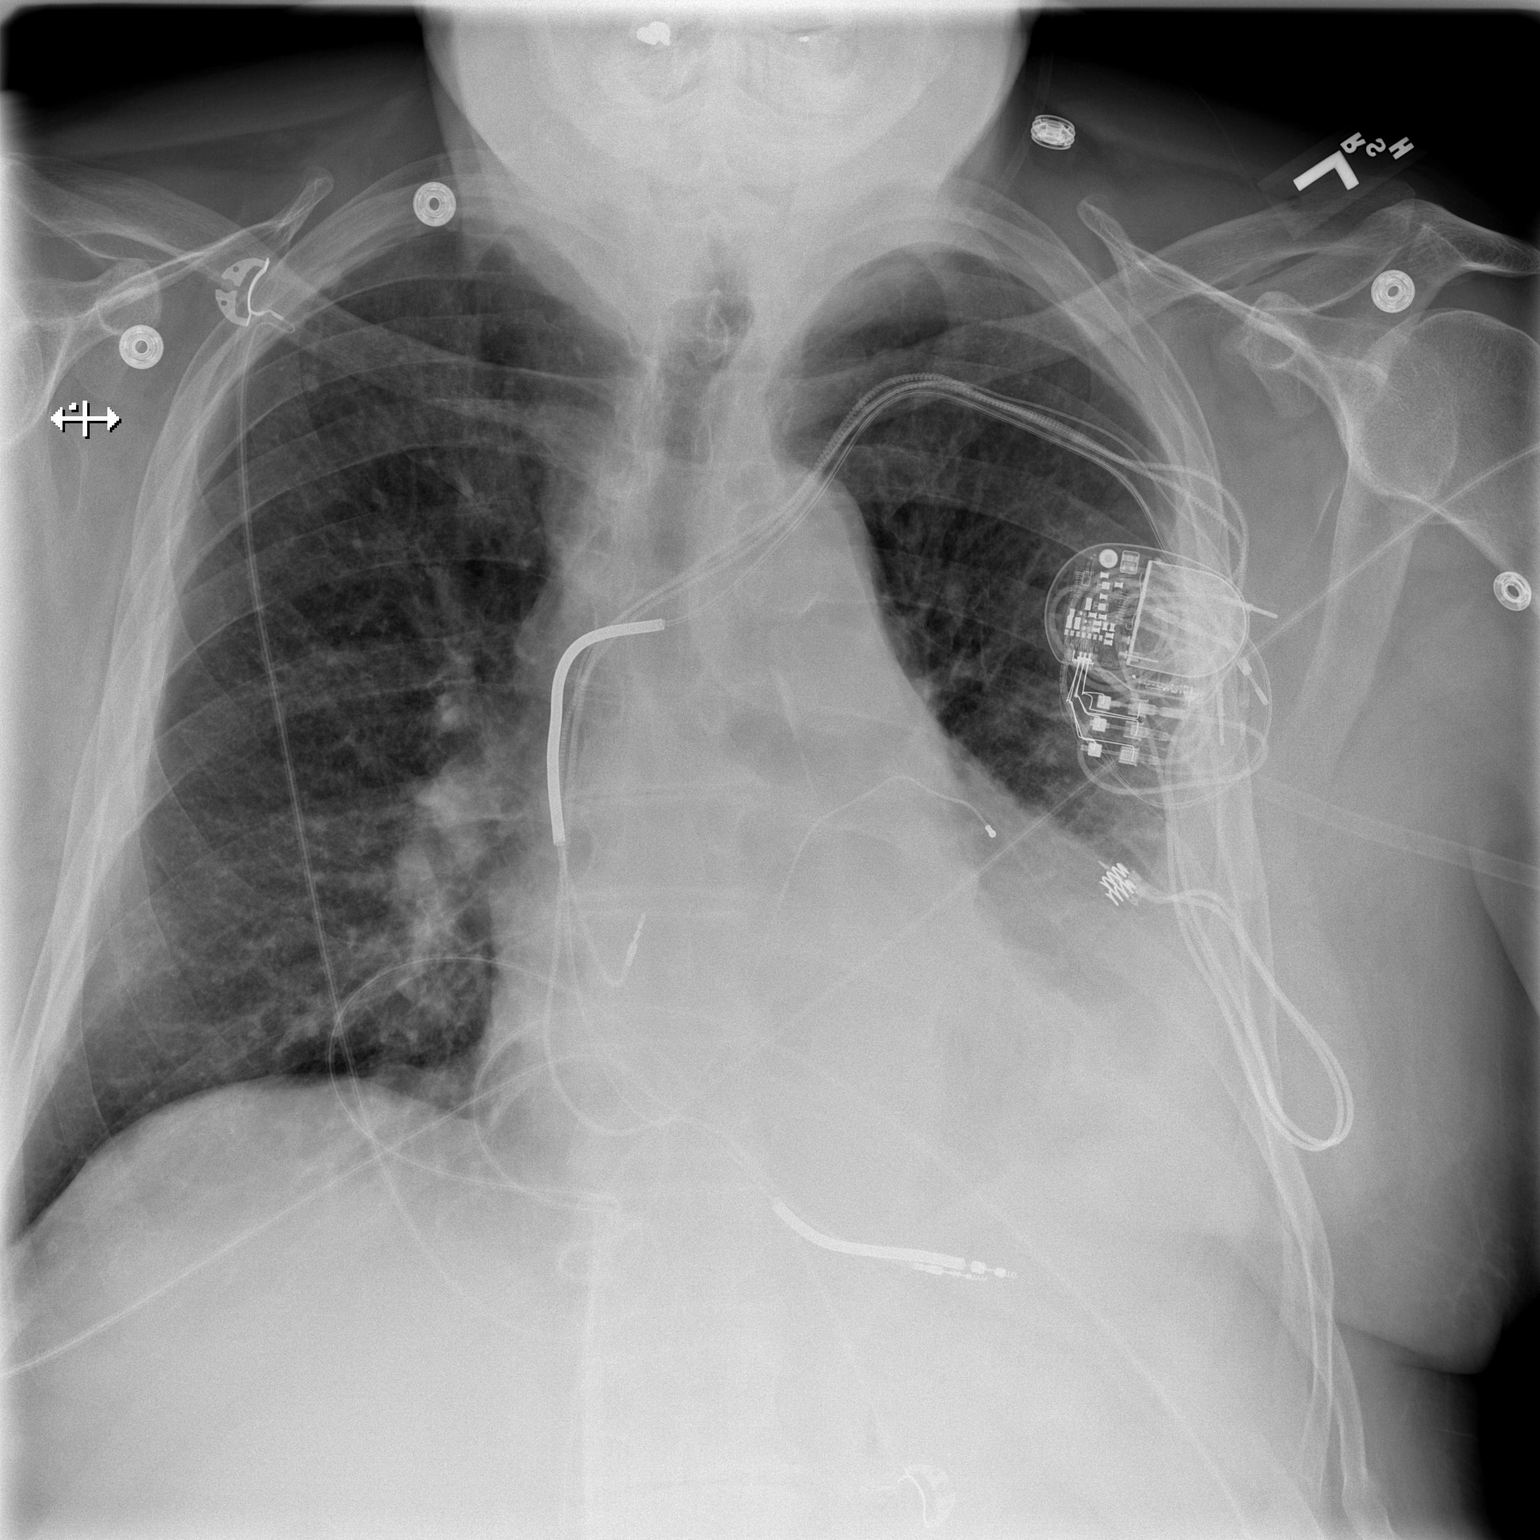

[w chest lat]
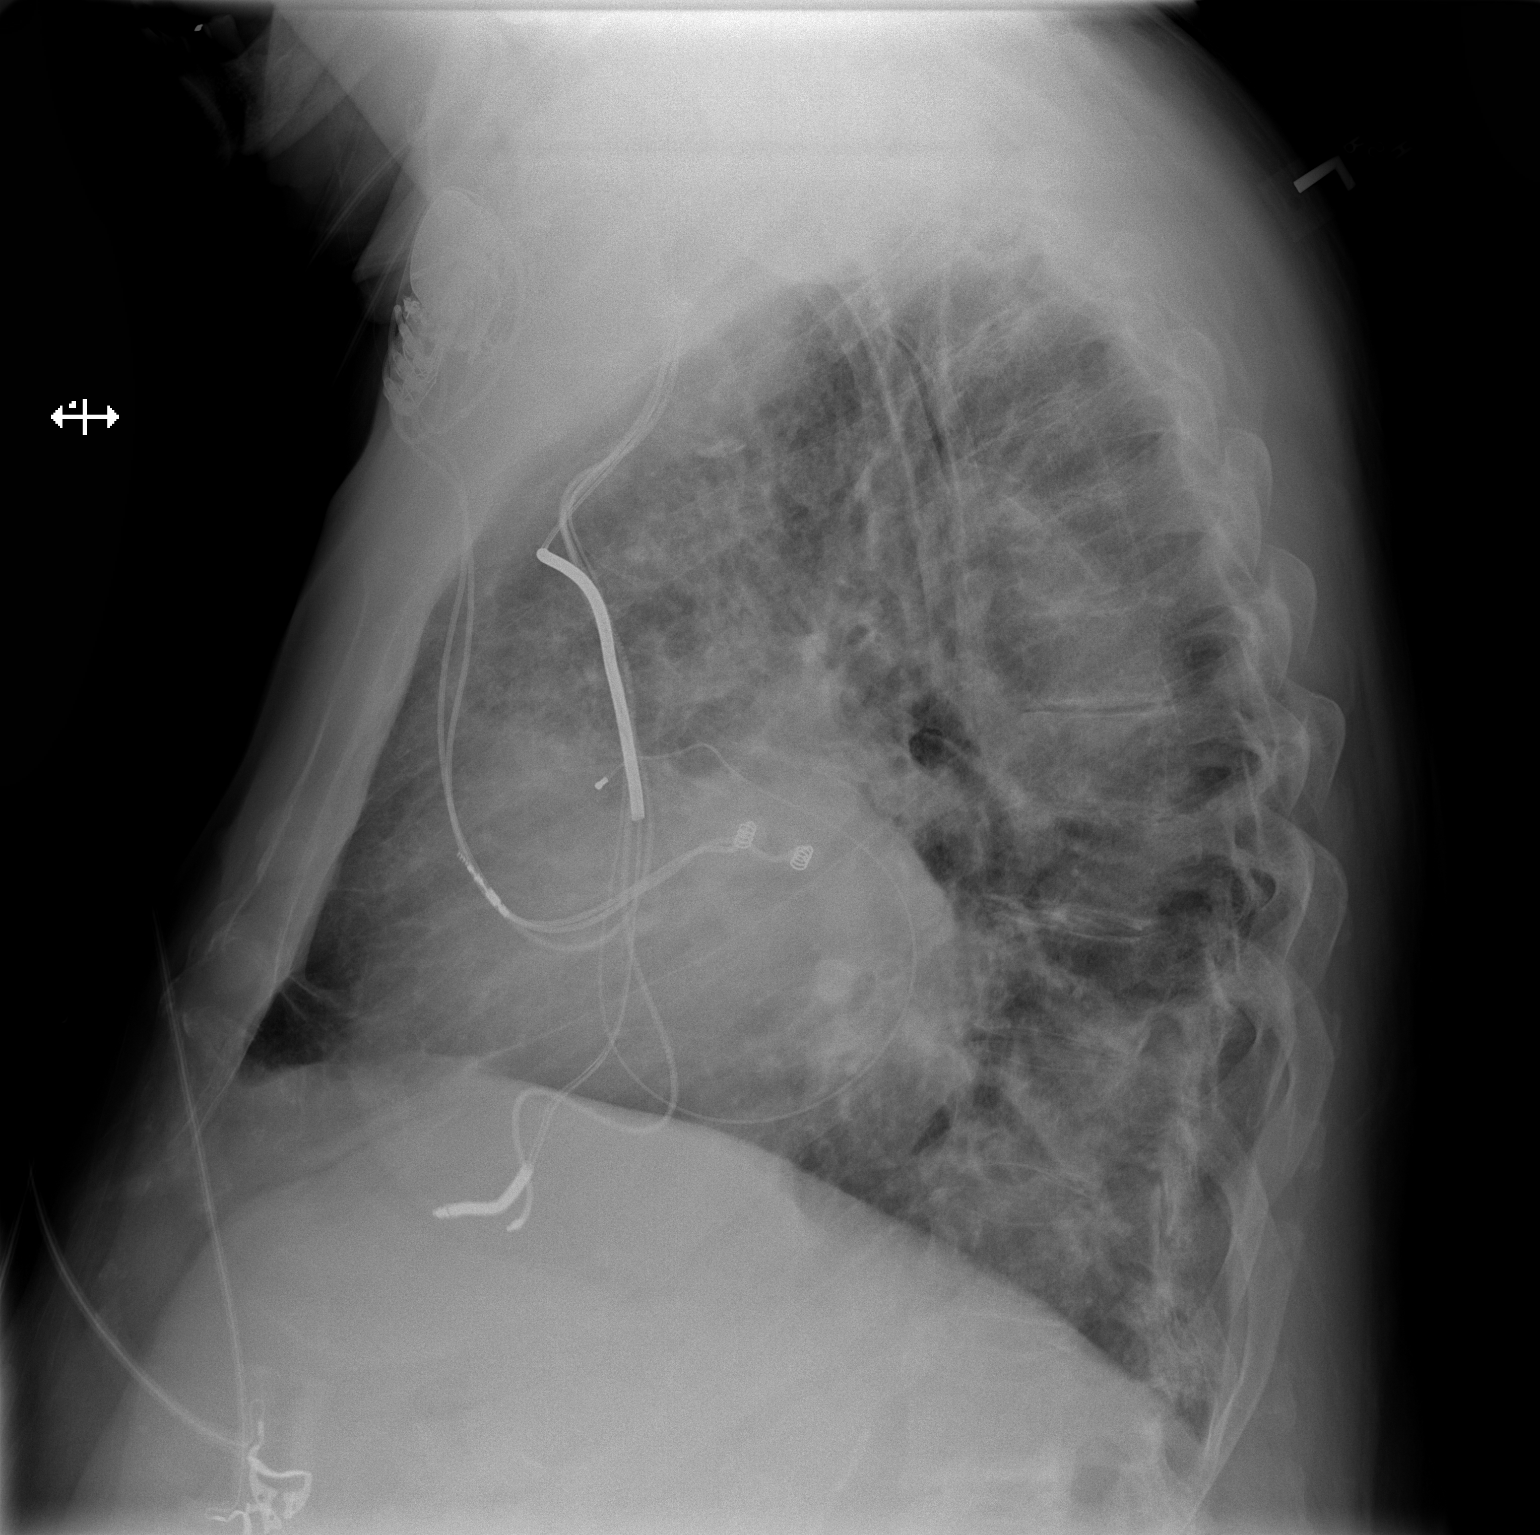

[2 of 2 positions shown; findings below may reference images not displayed]

FINDINGS: Moderate cardiomegaly.  Left subclavian AICD device and
leads are stable and intact.  No pneumothorax.  Mild patchy density
at the left base is stable.  No pleural effusion.
IMPRESSION: Cardiomegaly without CHF

Mild patchy density at the left base is stable.

## 2014-01-02 ENCOUNTER — Other Ambulatory Visit: Payer: Self-pay

## 2014-01-21 ENCOUNTER — Other Ambulatory Visit: Payer: Self-pay | Admitting: Sports Medicine

## 2014-01-21 DIAGNOSIS — G8929 Other chronic pain: Secondary | ICD-10-CM

## 2014-01-21 DIAGNOSIS — M545 Low back pain, unspecified: Secondary | ICD-10-CM

## 2014-01-22 ENCOUNTER — Ambulatory Visit
Admission: RE | Admit: 2014-01-22 | Discharge: 2014-01-22 | Disposition: A | Discharge status: Home / Self Care | Payer: Medicare Other | Source: Ambulatory Visit | Attending: Sports Medicine | Admitting: Sports Medicine

## 2014-01-22 DIAGNOSIS — G8929 Other chronic pain: Secondary | ICD-10-CM

## 2014-01-22 DIAGNOSIS — M545 Low back pain, unspecified: Secondary | ICD-10-CM

## 2014-01-22 MED ORDER — METHYLPREDNISOLONE ACETATE 40 MG/ML INJ SUSP (RADIOLOG
120.0000 mg | Freq: Once | INTRAMUSCULAR | Status: AC
  Administered 2014-01-22: 120 mg via EPIDURAL

## 2014-01-22 MED ORDER — IOHEXOL 180 MG/ML  SOLN
1.0000 mL | Freq: Once | INTRAMUSCULAR | Status: AC | PRN
  Administered 2014-01-22: 1 mL via EPIDURAL

## 2014-01-27 ENCOUNTER — Other Ambulatory Visit: Payer: Self-pay | Admitting: Internal Medicine

## 2014-02-26 ENCOUNTER — Encounter (HOSPITAL_COMMUNITY): Payer: Self-pay | Admitting: Internal Medicine

## 2014-05-07 ENCOUNTER — Other Ambulatory Visit: Payer: Self-pay

## 2014-05-07 MED ORDER — FUROSEMIDE 40 MG PO TABS: 80.0000 mg | ORAL_TABLET | Freq: Two times a day (BID) | ORAL | Status: DC

## 2014-06-09 ENCOUNTER — Ambulatory Visit (INDEPENDENT_AMBULATORY_CARE_PROVIDER_SITE_OTHER): Payer: Medicare Other | Admitting: Internal Medicine

## 2014-06-09 ENCOUNTER — Encounter: Payer: Self-pay | Admitting: Internal Medicine

## 2014-06-09 ENCOUNTER — Other Ambulatory Visit: Payer: Self-pay

## 2014-06-09 VITALS — BP 140/70 | HR 93 | Ht 71.0 in | Wt 215.8 lb

## 2014-06-09 DIAGNOSIS — Z950 Presence of cardiac pacemaker: Secondary | ICD-10-CM

## 2014-06-09 DIAGNOSIS — I442 Atrioventricular block, complete: Secondary | ICD-10-CM

## 2014-06-09 DIAGNOSIS — I10 Essential (primary) hypertension: Secondary | ICD-10-CM

## 2014-06-09 DIAGNOSIS — I5022 Chronic systolic (congestive) heart failure: Secondary | ICD-10-CM

## 2014-06-09 LAB — MDC_IDC_ENUM_SESS_TYPE_INCLINIC
Battery Voltage: 2.86 V
Date Time Interrogation Session: 20160322114739
Implantable Pulse Generator Model: 3210
Implantable Pulse Generator Serial Number: 2376403
Lead Channel Impedance Value: 275 Ohm
Lead Channel Impedance Value: 412.5 Ohm
Lead Channel Pacing Threshold Amplitude: 1 V
Lead Channel Pacing Threshold Amplitude: 1.5 V
Lead Channel Pacing Threshold Pulse Width: 0.5 ms
Lead Channel Pacing Threshold Pulse Width: 0.6 ms
Lead Channel Pacing Threshold Pulse Width: 0.8 ms
Lead Channel Sensing Intrinsic Amplitude: 2.2 mV
Lead Channel Sensing Intrinsic Amplitude: 9.2 mV
Lead Channel Setting Pacing Amplitude: 2 V
Lead Channel Setting Pacing Amplitude: 2.5 V
Lead Channel Setting Pacing Pulse Width: 0.6 ms
MDC IDC MSMT BATTERY REMAINING LONGEVITY: 32.4 mo
MDC IDC MSMT LEADCHNL LV PACING THRESHOLD AMPLITUDE: 1.5 V
MDC IDC MSMT LEADCHNL LV PACING THRESHOLD PULSEWIDTH: 0.8 ms
MDC IDC MSMT LEADCHNL RA PACING THRESHOLD AMPLITUDE: 1 V
MDC IDC MSMT LEADCHNL RV IMPEDANCE VALUE: 412.5 Ohm
MDC IDC SET LEADCHNL LV PACING AMPLITUDE: 2.5 V
MDC IDC SET LEADCHNL LV PACING PULSEWIDTH: 0.6 ms
MDC IDC SET LEADCHNL RV SENSING SENSITIVITY: 3.5 mV
MDC IDC STAT BRADY RA PERCENT PACED: 10 %
MDC IDC STAT BRADY RV PERCENT PACED: 98 %

## 2014-06-09 NOTE — Progress Notes (Signed)
HPI [name] returns today for follow-up. He is a very pleasant 81 year old man with chronic systolic heart failure, complete heart block, status post biventricular pacemaker insertion. In the interim, he has done well with no chest pain or sob. No edema. No time in the hospital.  Allergies  Allergen Reactions  . Clindamycin/Lincomycin Nausea And Vomiting     Current Outpatient Prescriptions  Medication Sig Dispense Refill  . acetaminophen (TYLENOL) 650 MG CR tablet Take 650 mg by mouth every 8 (eight) hours as needed for pain.    Marland Kitchen aspirin 81 MG tablet Take 81 mg by mouth daily.    . B Complex Vitamins (VITAMIN-B COMPLEX PO) Take 1 tablet by mouth daily.     . brimonidine-timolol (COMBIGAN) 0.2-0.5 % ophthalmic solution Place 1 drop into the right eye every 12 (twelve) hours.    . dorzolamide (TRUSOPT) 2 % ophthalmic solution Place 1 drop into the right eye 2 (two) times daily.     . fluticasone (FLONASE) 50 MCG/ACT nasal spray Place 2 sprays into the nose daily.     . furosemide (LASIX) 40 MG tablet Take 2 tablets by mouth in the AM and take 1 tablet by mouth in the PM    . gabapentin (NEURONTIN) 100 MG capsule Take 100-200 mg by mouth 3 (three) times daily.    . insulin lispro (HUMALOG) 100 UNIT/ML injection Inject 15-20 Units into the skin daily as needed for high blood sugar.     . levothyroxine (SYNTHROID, LEVOTHROID) 50 MCG tablet Take 1 tablet by mouth daily before breakfast. Take on an empty stomach    . loratadine (CLARITIN) 10 MG tablet Take 10 mg by mouth daily as needed for allergies.     Marland Kitchen losartan (COZAAR) 25 MG tablet TAKE 1 TABLET BY MOUTH DAILY 30 tablet 11  . metoprolol (LOPRESSOR) 50 MG tablet Take 1 tablet (50 mg total) by mouth 2 (two) times daily. 180 tablet 3  . Misc Natural Products (OSTEO BI-FLEX TRIPLE STRENGTH PO) Take 2 tablets by mouth daily.     . Multiple Vitamins-Minerals (MULTIVITAMIN WITH MINERALS) tablet Take 1 tablet by mouth daily.     Marland Kitchen  NITROSTAT 0.4 MG SL tablet TAKE 1 TABLET UNDER THE TONGUE AS NEEDEDFOR CHEST PAIN (MAY REPEAT EVERY 5 MIN X3 DOSES, CALL 911 IF NO RELIEF.) 25 tablet 5  . Omega-3 1400 MG CAPS Take 2 capsules by mouth daily.    Vladimir Faster Glycol-Propyl Glycol (SYSTANE ULTRA OP) Place 1 drop into the left eye every morning.     . potassium gluconate 595 MG TABS Take 595 mg by mouth daily.    . Saw Palmetto 450 MG CAPS Take 450 mg by mouth 2 (two) times daily.    . simvastatin (ZOCOR) 40 MG tablet Take 40 mg by mouth every evening.     Marland Kitchen TOUJEO SOLOSTAR 300 UNIT/ML SOPN Inject 70 Units into the skin at bedtime.    . travoprost, benzalkonium, (TRAVATAN) 0.004 % ophthalmic solution Place 1 drop into both eyes at bedtime.      No current facility-administered medications for this visit.     Past Medical History  Diagnosis Date  . Cardiomyopathy     broken 3x's  . History of fracture of nose   . Hyperlipidemia     takes Simvastatin daily  . Arthritis   . Seasonal allergies     uses Flonase prn and takes Claritin prn  . Dysrhythmia  HX OF COMPLETE HEART BLOCK;takes Metoprolol daily  . Hypertension     takes Losartan daily  . CHF (congestive heart failure)     takes Furosemide daily  . PONV (postoperative nausea and vomiting)   . Pacemaker     St Jude  . Anginal pain     within the last 2 wks has taken Ntg 1 time  . Shortness of breath     lying and with exertion  . Confusion     short term memory loss  . Joint pain   . Chronic back pain   . Skin spots, red   . H/O hiatal hernia   . History of colon polyps   . Diverticulosis   . Enlarged prostate     takes [employer]  . Nocturia   . Diabetes mellitus     type 2;takes Lantus nightly and Humalog prn  . Glaucoma     uses several drops  . History of shingles   . History of staph infection     50+yrs ago    ROS:   All systems reviewed and negative except as noted in the HPI.   Past Surgical History  Procedure Laterality Date  .  Transthoracic echocardiogram  01/2007  . Pacemaker lead removal N/A 08/21/2012    Procedure: PACEMAKER LEAD REMOVAL;  Surgeon: Evans Lance, MD;  Location: [facility] OR;  Service: Cardiovascular;  Laterality: N/A;  . Back surgery      x 2  . Joint replacement      right knee arthroplasty  . Insert / replace / remove pacemaker      x 3  . Colonoscopy    . Esophagogastroduodenoscopy    . Bilateral cataract surgery    . Right eye lasik    . Thoracotomy Left 10/24/2012    Procedure: THORACOTOMY MAJOR;  Surgeon: Gaye Pollack, MD;  Location: [facility] OR;  Service: Thoracic;  Laterality: Left;  . Epicardial pacing lead placement N/A 10/24/2012    Procedure: EPICARDIAL PACING LEAD PLACEMENT;  Surgeon: Gaye Pollack, MD;  Location: [facility] OR;  Service: Thoracic;  Laterality: N/A;  LV EPICARDIAL LEADS  . Lead revision N/A 09/30/2012    Procedure: LEAD REVISION;  Surgeon: Evans Lance, MD;  Location: [facility] CATH LAB;  Service: Cardiovascular;  Laterality: N/A;     Family History  Problem Relation Age of Onset  . Heart disease Mother      History   Social History  . Marital Status: Married    Spouse Name: N/A  . Number of Children: N/A  . Years of Education: N/A   Occupational History  . retired    Social History Main Topics  . Smoking status: Former Smoker    Quit date: 03/20/1968  . Smokeless tobacco: Never Used  . Alcohol Use: No  . Drug Use: No  . Sexual Activity: No   Other Topics Concern  . Not on file   Social History Narrative     BP 140/70 mmHg  Pulse 93  Ht 5\' 11"  (1.803 m)  Wt 215 lb 12.8 oz (97.886 kg)  BMI 30.11 kg/m2  Physical Exam:  Well appearing 81 year old man, NAD HEENT: Unremarkable Neck:  7 cm JVD, no thyromegally Back:  No CVA tenderness Lungs:  Clear, with no wheezes, rales, or rhonchi. HEART:  Regular rate rhythm, no murmurs, no rubs, no clicks Abd:  soft, positive bowel sounds, no organomegally, no rebound, no guarding Ext:  2 plus pulses, no edema,  no  cyanosis, no clubbing Skin:  No rashes no nodules Neuro:  CN II through XII intact, motor grossly intact  EKG - P synchronous biventricular pacing  DEVICE  Normal device function.  See PaceArt for details.   Assess/Plan:

## 2014-06-09 NOTE — Patient Instructions (Signed)
Remote monitoring is used to monitor your Pacemaker of ICD from home. This monitoring reduces the number of office visits required to check your device to one time per year. It allows us to keep an eye on the functioning of your device to ensure it is working properly. You are scheduled for a device check from home on 09/08/2014. You may send your transmission at any time that day. If you have a wireless device, the transmission will be sent automatically. After your physician reviews your transmission, you will receive a postcard with your next transmission date.  Your physician wants you to follow-up in: 12 months with Dr. Taylor.  You will receive a reminder letter in the mail two months in advance. If you don't receive a letter, please call our office to schedule the follow-up appointment.  

## 2014-06-09 NOTE — Assessment & Plan Note (Signed)
His St. Jude biventricular pacemaker is working normally. We'll plan to recheck in several months. 

## 2014-06-09 NOTE — Assessment & Plan Note (Signed)
His blood pressure is slightly elevated today. His wife states that his blood pressure normally is under good control. He will continue his current medications. He does not take his medicines this morning.

## 2014-06-09 NOTE — Assessment & Plan Note (Signed)
His symptoms are class II. He will continue his current medications and maintain a low-sodium diet.

## 2014-06-11 ENCOUNTER — Other Ambulatory Visit: Payer: Self-pay

## 2014-06-11 MED ORDER — NITROGLYCERIN 0.4 MG SL SUBL: SUBLINGUAL_TABLET | SUBLINGUAL | Status: DC

## 2014-08-05 ENCOUNTER — Encounter: Payer: Self-pay | Admitting: Interventional Cardiology

## 2014-08-05 ENCOUNTER — Ambulatory Visit (INDEPENDENT_AMBULATORY_CARE_PROVIDER_SITE_OTHER): Payer: Medicare Other | Admitting: Interventional Cardiology

## 2014-08-05 VITALS — BP 130/64 | HR 61 | Ht 71.0 in | Wt 216.8 lb

## 2014-08-05 DIAGNOSIS — I5022 Chronic systolic (congestive) heart failure: Secondary | ICD-10-CM

## 2014-08-05 DIAGNOSIS — E669 Obesity, unspecified: Secondary | ICD-10-CM | POA: Diagnosis not present

## 2014-08-05 LAB — BASIC METABOLIC PANEL
BUN: 18 mg/dL (ref 6–23)
CHLORIDE: 93 meq/L — AB (ref 96–112)
CO2: 36 meq/L — AB (ref 19–32)
CREATININE: 1.11 mg/dL (ref 0.40–1.50)
Calcium: 9.8 mg/dL (ref 8.4–10.5)
GFR: 67.52 mL/min (ref >60.00)
GLUCOSE: 172 mg/dL — AB (ref 70–99)
Potassium: 4.1 mEq/L (ref 3.5–5.1)
SODIUM: 134 meq/L — AB (ref 135–145)

## 2014-08-05 MED ORDER — FUROSEMIDE 40 MG PO TABS: 80.0000 mg | ORAL_TABLET | Freq: Two times a day (BID) | ORAL | Status: DC

## 2014-08-05 NOTE — Progress Notes (Signed)
Patient ID: Louis Acosta, male   DOB: Jan 01, 1932, 81 y.o.   MRN: [id number]     Cardiology Office Note   Date:  08/05/2014   ID:  Louis Acosta, DOB 16-Feb-1932, MRN [id number]  PCP:  [name], MD    No chief complaint on file. f/u NICM   Wt Readings from Last 3 Encounters:  08/05/14 216 lb 12.8 oz (98.34 kg)  06/09/14 215 lb 12.8 oz (97.886 kg)  07/23/13 219 lb 1.9 oz (99.392 kg)       History of Present Illness: Louis Acosta is a 81 y.o. male   who has decreased LV function. He has problems with balance. He has fallen several times recently. He has lost weight. He has been watching his diet and walking. He feels good. He is hoping to get his back injected.  Cardiomyopathy:  he has more energy. blood sugar has been dropping with weight loss; maintained it for  A year. c/o Leg edema occasional.  Denies : Chest pain.  Shortness of breath.  Orthopnea.  Paroxysmal nocturnal dyspnea.  Palpitations.  Syncope.   Oxygen sats dropped and he is now on supplemental oxygen. He feels that his balance is off. He has persistent back pain and is not a surgical candidate. Avoiding salt.  In March, he saw Dr. Lovena Le and his Lasix was increased to 80 mg BID.  Dr. Inda Merlin did some blood work lookinhg at thyroid.    Past Medical History  Diagnosis Date  . Cardiomyopathy     broken 3x's  . History of fracture of nose   . Hyperlipidemia     takes Simvastatin daily  . Arthritis   . Seasonal allergies     uses Flonase prn and takes Claritin prn  . Dysrhythmia     HX OF COMPLETE HEART BLOCK;takes Metoprolol daily  . Hypertension     takes Losartan daily  . CHF (congestive heart failure)     takes Furosemide daily  . PONV (postoperative nausea and vomiting)   . Pacemaker     St Jude  . Anginal pain     within the last 2 wks has taken Ntg 1 time  . Shortness of breath     lying and with exertion  . Confusion     short term memory loss  . Joint pain   .  Chronic back pain   . Skin spots, red   . H/O hiatal hernia   . History of colon polyps   . Diverticulosis   . Enlarged prostate     takes [employer]  . Nocturia   . Diabetes mellitus     type 2;takes Lantus nightly and Humalog prn  . Glaucoma     uses several drops  . History of shingles   . History of staph infection     50+yrs ago    Past Surgical History  Procedure Laterality Date  . Transthoracic echocardiogram  01/2007  . Pacemaker lead removal N/A 08/21/2012    Procedure: PACEMAKER LEAD REMOVAL;  Surgeon: Evans Lance, MD;  Location: [facility] OR;  Service: Cardiovascular;  Laterality: N/A;  . Back surgery      x 2  . Joint replacement      right knee arthroplasty  . Insert / replace / remove pacemaker      x 3  . Colonoscopy    . Esophagogastroduodenoscopy    . Bilateral cataract surgery    . Right eye lasik    .  Thoracotomy Left 10/24/2012    Procedure: THORACOTOMY MAJOR;  Surgeon: Gaye Pollack, MD;  Location: [facility] OR;  Service: Thoracic;  Laterality: Left;  . Epicardial pacing lead placement N/A 10/24/2012    Procedure: EPICARDIAL PACING LEAD PLACEMENT;  Surgeon: Gaye Pollack, MD;  Location: [facility] OR;  Service: Thoracic;  Laterality: N/A;  LV EPICARDIAL LEADS  . Lead revision N/A 09/30/2012    Procedure: LEAD REVISION;  Surgeon: Evans Lance, MD;  Location: [facility] CATH LAB;  Service: Cardiovascular;  Laterality: N/A;     Current Outpatient Prescriptions  Medication Sig Dispense Refill  . acetaminophen (TYLENOL) 650 MG CR tablet Take 650 mg by mouth every 8 (eight) hours as needed for pain.    . B Complex Vitamins (VITAMIN-B COMPLEX PO) Take 1 tablet by mouth daily.     . brimonidine-timolol (COMBIGAN) 0.2-0.5 % ophthalmic solution Place 1 drop into the right eye every 12 (twelve) hours.    . dorzolamide (TRUSOPT) 2 % ophthalmic solution Place 1 drop into the right eye 2 (two) times daily.     . fluticasone (FLONASE) 50 MCG/ACT nasal spray Place 2 sprays into the nose  daily.     . furosemide (LASIX) 40 MG tablet Take 2 tablets (80 mg total) by mouth 2 (two) times daily. Take 2 tablets by mouth in the AM and take 2 tablet by mouth in the PM 90 tablet 3  . gabapentin (NEURONTIN) 100 MG capsule Take 100-200 mg by mouth 3 (three) times daily.    . insulin lispro (HUMALOG) 100 UNIT/ML injection Inject 15-20 Units into the skin daily as needed for high blood sugar.     . levothyroxine (SYNTHROID, LEVOTHROID) 50 MCG tablet Take 1 tablet by mouth daily before breakfast. Take on an empty stomach    . loratadine (CLARITIN) 10 MG tablet Take 10 mg by mouth daily as needed for allergies.     Marland Kitchen losartan (COZAAR) 25 MG tablet TAKE 1 TABLET BY MOUTH DAILY 30 tablet 11  . metoCLOPramide (REGLAN) 5 MG tablet Take 5 mg by mouth daily.    . metoprolol (LOPRESSOR) 50 MG tablet Take 1 tablet (50 mg total) by mouth 2 (two) times daily. 180 tablet 3  . Misc Natural Products (OSTEO BI-FLEX TRIPLE STRENGTH PO) Take 2 tablets by mouth daily.     . Multiple Vitamins-Minerals (MULTIVITAMIN WITH MINERALS) tablet Take 1 tablet by mouth daily.     . nitroGLYCERIN (NITROSTAT) 0.4 MG SL tablet TAKE 1 TABLET UNDER THE TONGUE AS NEEDEDFOR CHEST PAIN (MAY REPEAT EVERY 5 MIN X3 DOSES, CALL 911 IF NO RELIEF.) 25 tablet 5  . Omega-3 1400 MG CAPS Take 2 capsules by mouth daily.    Vladimir Faster Glycol-Propyl Glycol (SYSTANE ULTRA OP) Place 1 drop into the left eye every morning.     . potassium gluconate 595 MG TABS Take 595 mg by mouth daily.    . Saw Palmetto 450 MG CAPS Take 450 mg by mouth 2 (two) times daily.    . simvastatin (ZOCOR) 40 MG tablet Take 40 mg by mouth every evening.     Marland Kitchen TOUJEO SOLOSTAR 300 UNIT/ML SOPN Inject 70 Units into the skin at bedtime.    . travoprost, benzalkonium, (TRAVATAN) 0.004 % ophthalmic solution Place 1 drop into both eyes at bedtime.      No current facility-administered medications for this visit.    Allergies:   Clindamycin/lincomycin    Social History:   The patient  reports that he  quit smoking about 46 years ago. He has never used smokeless tobacco. He reports that he does not drink alcohol or use illicit drugs.   Family History:  The patient's *family history includes Heart disease in his mother.    ROS:  Please see the history of present illness.   Otherwise, review of systems are positive for poor balance, falls.   All other systems are reviewed and negative.    PHYSICAL EXAM: VS:  BP 130/64 mmHg  Pulse 61  Ht 5\' 11"  (1.803 m)  Wt 216 lb 12.8 oz (98.34 kg)  BMI 30.25 kg/m2  SpO2 89% , BMI Body mass index is 30.25 kg/(m^2). GEN: Well nourished, well developed, in no acute distress HEENT: normal Neck: no JVD, carotid bruits, or masses Cardiac: RRR; no murmurs, rubs, or gallops,no edema  Respiratory:  clear to auscultation bilaterally, normal work of breathing GI: soft, nontender, nondistended, + BS MS: no deformity or atrophy Skin: warm and dry, no rash Neuro:  Strength and sensation are intact Psych: euthymic mood, full affect     Recent Labs: No results found for requested labs within last 365 days.   Lipid Panel No results found for: CHOL, TRIG, HDL, CHOLHDL, VLDL, LDLCALC, LDLDIRECT   Other studies Reviewed: Additional studies/ records that were reviewed today with results demonstrating: Dr. Lovena Le increased Lasix.   ASSESSMENT AND PLAN:  Cardiomyopathy / chronic systolic heart failure Continue Losartan Potassium Tablet, 25 MG, 1 tablet, Orally, Once a day Continue Metoprolol Tartrate Tablet, 50 MG, 2 tablets in am and 1 in pm, Orally, as directed Notes: No signs of heart failure. continue current medications. Refill LAsix 80 mg BID.  Check BMet today.   2. Obesity  Notes: He has lost a significant amount of weight through diet control a few years ago.  He has gained a little bit of weight since being less active.  HTN: BP controlled. Continue current medications.  Hyperlipidemia: Lipids in October 14 showed  LDL 64, HDL 31, total cholesterol 148. Continue current lipid-lowering therapy.  Obtain most recent readings from  PCP.     Current medicines are reviewed at length with the patient today.  The patient concerns regarding his medicines were addressed.  The following changes have been made:  No change  Labs/ tests ordered today include:   Orders Placed This Encounter  Procedures  . Basic Metabolic Panel (BMET)    Recommend 150 minutes/week of aerobic exercise Low fat, low carb, high fiber diet recommended  Disposition:   FU in 1 year   Teresita Madura., MD  08/05/2014 4:22 PM    [facility] [address], [place], [place]  [address] Phone: [phone]; Fax: [phone]

## 2014-08-05 NOTE — Patient Instructions (Signed)
Medication Instructions:  Your physician recommends that you continue on your current medications as directed. Please refer to the Current Medication list given to you today.   Labwork: Your physician recommends that you return for lab work TODAY (bmet)   Testing/Procedures: NONE  Follow-Up: Your physician wants you to follow-up in: 12 months with Dr. Irish Lack. You will receive a reminder letter in the mail two months in advance. If you don't receive a letter, please call our office to schedule the follow-up appointment.   Any Other Special Instructions Will Be Listed Below (If Applicable).

## 2014-08-11 ENCOUNTER — Other Ambulatory Visit: Payer: Self-pay | Admitting: Sports Medicine

## 2014-08-11 DIAGNOSIS — M48061 Spinal stenosis, lumbar region without neurogenic claudication: Secondary | ICD-10-CM

## 2014-08-12 ENCOUNTER — Ambulatory Visit
Admission: RE | Admit: 2014-08-12 | Discharge: 2014-08-12 | Disposition: A | Discharge status: Home / Self Care | Payer: Medicare Other | Source: Ambulatory Visit | Attending: Sports Medicine | Admitting: Sports Medicine

## 2014-08-12 DIAGNOSIS — M48061 Spinal stenosis, lumbar region without neurogenic claudication: Secondary | ICD-10-CM

## 2014-08-12 MED ORDER — METHYLPREDNISOLONE ACETATE 40 MG/ML INJ SUSP (RADIOLOG
120.0000 mg | Freq: Once | INTRAMUSCULAR | Status: AC
  Administered 2014-08-12: 120 mg via EPIDURAL

## 2014-08-12 MED ORDER — IOHEXOL 180 MG/ML  SOLN
1.0000 mL | Freq: Once | INTRAMUSCULAR | Status: AC | PRN
  Administered 2014-08-12: 1 mL via EPIDURAL

## 2014-09-08 ENCOUNTER — Ambulatory Visit (INDEPENDENT_AMBULATORY_CARE_PROVIDER_SITE_OTHER): Payer: Medicare Other | Admitting: *Deleted

## 2014-09-08 DIAGNOSIS — I442 Atrioventricular block, complete: Secondary | ICD-10-CM

## 2014-09-08 NOTE — Progress Notes (Signed)
Remote pacemaker transmission.   

## 2014-09-09 LAB — CUP PACEART REMOTE DEVICE CHECK
Battery Remaining Longevity: 24 mo
Battery Voltage: 2.83 V
Brady Statistic AP VS Percent: 1 %
Brady Statistic AS VS Percent: 1 %
Brady Statistic RA Percent Paced: 7.4 %
Date Time Interrogation Session: 20160621073950
Lead Channel Impedance Value: 280 Ohm
Lead Channel Impedance Value: 430 Ohm
Lead Channel Pacing Threshold Amplitude: 1 V
Lead Channel Pacing Threshold Amplitude: 1.5 V
Lead Channel Pacing Threshold Pulse Width: 0.6 ms
Lead Channel Pacing Threshold Pulse Width: 0.8 ms
Lead Channel Setting Pacing Amplitude: 2 V
Lead Channel Setting Pacing Amplitude: 2.5 V
Lead Channel Setting Pacing Pulse Width: 0.6 ms
MDC IDC MSMT BATTERY REMAINING PERCENTAGE: 35 %
MDC IDC MSMT LEADCHNL RA IMPEDANCE VALUE: 410 Ohm
MDC IDC MSMT LEADCHNL RA PACING THRESHOLD AMPLITUDE: 1 V
MDC IDC MSMT LEADCHNL RA PACING THRESHOLD PULSEWIDTH: 0.5 ms
MDC IDC MSMT LEADCHNL RA SENSING INTR AMPL: 2.4 mV
MDC IDC MSMT LEADCHNL RV SENSING INTR AMPL: 12 mV
MDC IDC SET LEADCHNL LV PACING AMPLITUDE: 2.5 V
MDC IDC SET LEADCHNL LV PACING PULSEWIDTH: 0.6 ms
MDC IDC SET LEADCHNL RV SENSING SENSITIVITY: 3.5 mV
MDC IDC STAT BRADY AP VP PERCENT: 7.8 %
MDC IDC STAT BRADY AS VP PERCENT: 91 %
Pulse Gen Model: 3210
Pulse Gen Serial Number: 2376403

## 2014-09-14 ENCOUNTER — Other Ambulatory Visit: Payer: Self-pay

## 2014-09-22 ENCOUNTER — Encounter: Payer: Self-pay | Admitting: Cardiology

## 2014-09-25 ENCOUNTER — Encounter: Payer: Self-pay | Admitting: Internal Medicine

## 2014-12-07 ENCOUNTER — Other Ambulatory Visit: Payer: Self-pay | Admitting: Sports Medicine

## 2014-12-07 DIAGNOSIS — M48061 Spinal stenosis, lumbar region without neurogenic claudication: Secondary | ICD-10-CM

## 2014-12-08 ENCOUNTER — Encounter: Payer: Self-pay | Admitting: Internal Medicine

## 2014-12-08 ENCOUNTER — Ambulatory Visit (INDEPENDENT_AMBULATORY_CARE_PROVIDER_SITE_OTHER): Payer: Medicare Other | Admitting: *Deleted

## 2014-12-08 DIAGNOSIS — I442 Atrioventricular block, complete: Secondary | ICD-10-CM

## 2014-12-08 NOTE — Progress Notes (Signed)
Remote pacemaker transmission.   

## 2014-12-09 ENCOUNTER — Ambulatory Visit
Admission: RE | Admit: 2014-12-09 | Discharge: 2014-12-09 | Disposition: A | Discharge status: Home / Self Care | Payer: Medicare Other | Source: Ambulatory Visit | Attending: Sports Medicine | Admitting: Sports Medicine

## 2014-12-09 DIAGNOSIS — M48061 Spinal stenosis, lumbar region without neurogenic claudication: Secondary | ICD-10-CM

## 2014-12-09 MED ORDER — METHYLPREDNISOLONE ACETATE 40 MG/ML INJ SUSP (RADIOLOG
120.0000 mg | Freq: Once | INTRAMUSCULAR | Status: AC
  Administered 2014-12-09: 120 mg via EPIDURAL

## 2014-12-09 MED ORDER — IOHEXOL 180 MG/ML  SOLN
1.0000 mL | Freq: Once | INTRAMUSCULAR | Status: DC | PRN
  Administered 2014-12-09: 1 mL via EPIDURAL

## 2014-12-09 NOTE — Discharge Instructions (Signed)

## 2014-12-14 LAB — CUP PACEART REMOTE DEVICE CHECK
Battery Remaining Longevity: 20 mo
Battery Remaining Percentage: 29 %
Battery Voltage: 2.81 V
Brady Statistic AP VP Percent: 5.3 %
Brady Statistic AP VS Percent: 1 %
Brady Statistic AS VS Percent: 1 %
Brady Statistic RA Percent Paced: 5 %
Date Time Interrogation Session: 20160920081443
Lead Channel Impedance Value: 390 Ohm
Lead Channel Pacing Threshold Amplitude: 1 V
Lead Channel Pacing Threshold Amplitude: 1 V
Lead Channel Pacing Threshold Amplitude: 1.5 V
Lead Channel Pacing Threshold Pulse Width: 0.5 ms
Lead Channel Setting Pacing Amplitude: 2 V
Lead Channel Setting Pacing Amplitude: 2.5 V
Lead Channel Setting Pacing Amplitude: 2.5 V
Lead Channel Setting Pacing Pulse Width: 0.6 ms
Lead Channel Setting Pacing Pulse Width: 0.6 ms
Lead Channel Setting Sensing Sensitivity: 3.5 mV
MDC IDC MSMT LEADCHNL LV IMPEDANCE VALUE: 280 Ohm
MDC IDC MSMT LEADCHNL LV PACING THRESHOLD PULSEWIDTH: 0.8 ms
MDC IDC MSMT LEADCHNL RA SENSING INTR AMPL: 1.7 mV
MDC IDC MSMT LEADCHNL RV IMPEDANCE VALUE: 410 Ohm
MDC IDC MSMT LEADCHNL RV PACING THRESHOLD PULSEWIDTH: 0.6 ms
MDC IDC MSMT LEADCHNL RV SENSING INTR AMPL: 12 mV
MDC IDC STAT BRADY AS VP PERCENT: 94 %
Pulse Gen Serial Number: 2376403

## 2014-12-15 ENCOUNTER — Telehealth: Payer: Self-pay | Admitting: Interventional Cardiology

## 2014-12-15 NOTE — Telephone Encounter (Signed)
Pt gave verbal permission to speak with wife. Wife states that pt was taken to see Dr. Inda Merlin because they could tell pt was holding fluid and he was having increased SOB. Pt was up 12 pounds at that time so Dr. Inda Merlin increased pt's Lasix to 80mg  TID at least until he sees him back in November. Pt's weight this morning was 203lbs and pt states that SOB is much better than it was. Wife said that pt currently feels fine, no cardiac issues. Will forward to Dr. Irish Lack for review.

## 2014-12-15 NOTE — Telephone Encounter (Signed)
Received document from [employer] in regards to pt's weight fluctuating for the last month between ~203-216. Nurses note on document states pt reports wt loss of 8lbs in 2 days and that pt denies sx on [place] Monitor.   Called pt and left message to call back

## 2014-12-25 ENCOUNTER — Encounter: Payer: Self-pay | Admitting: Cardiology

## 2015-01-08 ENCOUNTER — Other Ambulatory Visit: Payer: Self-pay | Admitting: Interventional Cardiology

## 2015-02-09 ENCOUNTER — Encounter (INDEPENDENT_AMBULATORY_CARE_PROVIDER_SITE_OTHER): Payer: Medicare Other | Admitting: Ophthalmology

## 2015-02-09 DIAGNOSIS — H35033 Hypertensive retinopathy, bilateral: Secondary | ICD-10-CM | POA: Diagnosis not present

## 2015-02-09 DIAGNOSIS — E11311 Type 2 diabetes mellitus with unspecified diabetic retinopathy with macular edema: Secondary | ICD-10-CM | POA: Diagnosis not present

## 2015-02-09 DIAGNOSIS — E113212 Type 2 diabetes mellitus with mild nonproliferative diabetic retinopathy with macular edema, left eye: Secondary | ICD-10-CM

## 2015-02-09 DIAGNOSIS — E113293 Type 2 diabetes mellitus with mild nonproliferative diabetic retinopathy without macular edema, bilateral: Secondary | ICD-10-CM

## 2015-02-09 DIAGNOSIS — H43813 Vitreous degeneration, bilateral: Secondary | ICD-10-CM | POA: Diagnosis not present

## 2015-02-09 DIAGNOSIS — D3131 Benign neoplasm of right choroid: Secondary | ICD-10-CM | POA: Diagnosis not present

## 2015-02-09 DIAGNOSIS — I10 Essential (primary) hypertension: Secondary | ICD-10-CM

## 2015-03-09 ENCOUNTER — Ambulatory Visit (INDEPENDENT_AMBULATORY_CARE_PROVIDER_SITE_OTHER): Payer: Medicare Other | Admitting: *Deleted

## 2015-03-09 DIAGNOSIS — I442 Atrioventricular block, complete: Secondary | ICD-10-CM | POA: Diagnosis not present

## 2015-03-09 LAB — CUP PACEART REMOTE DEVICE CHECK
Battery Remaining Longevity: 16 mo
Battery Remaining Percentage: 22 %
Battery Voltage: 2.78 V
Brady Statistic AP VS Percent: 1 %
Brady Statistic AS VP Percent: 94 %
Brady Statistic AS VS Percent: 1 %
Date Time Interrogation Session: 20161220084950
Implantable Lead Implant Date: 20041123
Implantable Lead Implant Date: 20140807
Implantable Lead Location: 753858
Implantable Lead Location: 753859
Implantable Lead Model: 5076
Implantable Lead Serial Number: 190354
Lead Channel Impedance Value: 290 Ohm
Lead Channel Setting Pacing Amplitude: 2 V
Lead Channel Setting Pacing Amplitude: 2.5 V
Lead Channel Setting Sensing Sensitivity: 3.5 mV
MDC IDC LEAD IMPLANT DT: 20041123
MDC IDC LEAD LOCATION: 753860
MDC IDC LEAD MODEL: 511212
MDC IDC MSMT LEADCHNL RA IMPEDANCE VALUE: 400 Ohm
MDC IDC MSMT LEADCHNL RA SENSING INTR AMPL: 2 mV
MDC IDC MSMT LEADCHNL RV IMPEDANCE VALUE: 410 Ohm
MDC IDC MSMT LEADCHNL RV SENSING INTR AMPL: 6.4 mV
MDC IDC SET LEADCHNL LV PACING PULSEWIDTH: 0.6 ms
MDC IDC SET LEADCHNL RV PACING AMPLITUDE: 2.5 V
MDC IDC SET LEADCHNL RV PACING PULSEWIDTH: 0.6 ms
MDC IDC STAT BRADY AP VP PERCENT: 5.4 %
MDC IDC STAT BRADY RA PERCENT PACED: 5.1 %
Pulse Gen Model: 3210
Pulse Gen Serial Number: 2376403

## 2015-03-09 NOTE — Progress Notes (Signed)
Remote pacemaker transmission.   

## 2015-04-08 ENCOUNTER — Encounter: Payer: Self-pay | Admitting: Cardiology

## 2015-05-11 ENCOUNTER — Telehealth: Payer: Self-pay | Admitting: Internal Medicine

## 2015-05-11 NOTE — Telephone Encounter (Signed)
New Message  Pt wife stated that they have not received Lasix Rx from [facility]- Pt wife called [facility] and they stated they were sending request to our office to refill. Pt wife following up to see if we receved request. Pt has appt w/ dr Lovena Le 3/29 @ 9am

## 2015-05-11 NOTE — Telephone Encounter (Signed)
Spoke with patients wife as per epic patient should have enough refills through October. She stated that the patients pcp, Dr Inda Merlin changed the dose which is why he has depleted his supply so quickly. He still has medication left and she will call patients pcp to see if they will order an rx with updated dose, if not she will call back.

## 2015-06-09 ENCOUNTER — Ambulatory Visit (INDEPENDENT_AMBULATORY_CARE_PROVIDER_SITE_OTHER): Payer: Medicare Other | Admitting: Ophthalmology

## 2015-06-09 DIAGNOSIS — H43813 Vitreous degeneration, bilateral: Secondary | ICD-10-CM

## 2015-06-09 DIAGNOSIS — E113291 Type 2 diabetes mellitus with mild nonproliferative diabetic retinopathy without macular edema, right eye: Secondary | ICD-10-CM | POA: Diagnosis not present

## 2015-06-09 DIAGNOSIS — E10319 Type 1 diabetes mellitus with unspecified diabetic retinopathy without macular edema: Secondary | ICD-10-CM | POA: Diagnosis not present

## 2015-06-09 DIAGNOSIS — I10 Essential (primary) hypertension: Secondary | ICD-10-CM | POA: Diagnosis not present

## 2015-06-09 DIAGNOSIS — E113392 Type 2 diabetes mellitus with moderate nonproliferative diabetic retinopathy without macular edema, left eye: Secondary | ICD-10-CM | POA: Diagnosis not present

## 2015-06-09 DIAGNOSIS — H35033 Hypertensive retinopathy, bilateral: Secondary | ICD-10-CM

## 2015-06-09 DIAGNOSIS — D3131 Benign neoplasm of right choroid: Secondary | ICD-10-CM

## 2015-06-16 ENCOUNTER — Ambulatory Visit (INDEPENDENT_AMBULATORY_CARE_PROVIDER_SITE_OTHER): Payer: Medicare Other | Admitting: Internal Medicine

## 2015-06-16 ENCOUNTER — Encounter: Payer: Self-pay | Admitting: Internal Medicine

## 2015-06-16 VITALS — BP 102/66 | HR 94 | Ht 71.0 in | Wt 205.0 lb

## 2015-06-16 DIAGNOSIS — I442 Atrioventricular block, complete: Secondary | ICD-10-CM | POA: Diagnosis not present

## 2015-06-16 DIAGNOSIS — I5022 Chronic systolic (congestive) heart failure: Secondary | ICD-10-CM

## 2015-06-16 NOTE — Progress Notes (Signed)
HPI [name] returns today for follow-up. He is a very pleasant 82 year old man with chronic systolic heart failure, complete heart block, status post biventricular pacemaker insertion. In the interim, he has done well with no chest pain or sob. No edema. No time in the hospital. He has fallen once. His lasix dose has been uptitrated over time as he has a propensity for peripheral edema. Allergies  Allergen Reactions  . Clindamycin/Lincomycin Nausea And Vomiting     Current Outpatient Prescriptions  Medication Sig Dispense Refill  . acetaminophen (TYLENOL) 650 MG CR tablet Take 650 mg by mouth every 8 (eight) hours as needed for pain.    . B Complex Vitamins (VITAMIN-B COMPLEX PO) Take 1 tablet by mouth daily.     . brimonidine-timolol (COMBIGAN) 0.2-0.5 % ophthalmic solution Place 1 drop into the right eye every 12 (twelve) hours.    . dorzolamide (TRUSOPT) 2 % ophthalmic solution Place 1 drop into the right eye 2 (two) times daily.     . fluticasone (FLONASE) 50 MCG/ACT nasal spray Place 2 sprays into the nose daily.     . furosemide (LASIX) 40 MG tablet Take three (3) tablets (120 mg total) by mouth twice daily.    Marland Kitchen gabapentin (NEURONTIN) 100 MG capsule Take 100-200 mg by mouth 3 (three) times daily.    . insulin lispro (HUMALOG) 100 UNIT/ML injection Inject 15-20 Units into the skin daily as needed for high blood sugar.     . levothyroxine (SYNTHROID, LEVOTHROID) 50 MCG tablet Take 1 tablet by mouth daily before breakfast. Take on an empty stomach    . loratadine (CLARITIN) 10 MG tablet Take 10 mg by mouth daily as needed for allergies.     Marland Kitchen losartan (COZAAR) 25 MG tablet TAKE 1 TABLET BY MOUTH DAILY 30 tablet 11  . metoCLOPramide (REGLAN) 5 MG tablet Take 5 mg by mouth daily.    . metoprolol (LOPRESSOR) 50 MG tablet Take 25 mg by mouth 2 (two) times daily.    . Misc Natural Products (OSTEO BI-FLEX TRIPLE STRENGTH PO) Take 2 tablets by mouth daily.     . Multiple  Vitamins-Minerals (MULTIVITAMIN WITH MINERALS) tablet Take 1 tablet by mouth daily.     . nitroGLYCERIN (NITROSTAT) 0.4 MG SL tablet TAKE 1 TABLET UNDER THE TONGUE AS NEEDEDFOR CHEST PAIN (MAY REPEAT EVERY 5 MIN X3 DOSES, CALL 911 IF NO RELIEF.) 25 tablet 5  . Omega-3 1400 MG CAPS Take 2 capsules by mouth daily.    Vladimir Faster Glycol-Propyl Glycol (SYSTANE ULTRA OP) Place 1 drop into the left eye every morning.     . potassium gluconate 595 MG TABS Take 595 mg by mouth daily.    . Saw Palmetto 450 MG CAPS Take 450 mg by mouth 2 (two) times daily.    . simvastatin (ZOCOR) 40 MG tablet Take 40 mg by mouth every evening.     Marland Kitchen TOUJEO SOLOSTAR 300 UNIT/ML SOPN Inject 70 Units into the skin at bedtime.    . travoprost, benzalkonium, (TRAVATAN) 0.004 % ophthalmic solution Place 1 drop into the right eye at bedtime.      No current facility-administered medications for this visit.     Past Medical History  Diagnosis Date  . Cardiomyopathy     broken 3x's  . History of fracture of nose   . Hyperlipidemia     takes Simvastatin daily  . Arthritis   . Seasonal allergies     uses Flonase  prn and takes Claritin prn  . Dysrhythmia     HX OF COMPLETE HEART BLOCK;takes Metoprolol daily  . Hypertension     takes Losartan daily  . CHF (congestive heart failure) (HCC)     takes Furosemide daily  . PONV (postoperative nausea and vomiting)   . Pacemaker     St Jude  . Anginal pain ([place])     within the last 2 wks has taken Ntg 1 time  . Shortness of breath     lying and with exertion  . Confusion     short term memory loss  . Joint pain   . Chronic back pain   . Skin spots, red   . H/O hiatal hernia   . History of colon polyps   . Diverticulosis   . Enlarged prostate     takes [employer]  . Nocturia   . Diabetes mellitus     type 2;takes Lantus nightly and Humalog prn  . Glaucoma     uses several drops  . History of shingles   . History of staph infection     50+yrs ago     ROS:   All systems reviewed and negative except as noted in the HPI.   Past Surgical History  Procedure Laterality Date  . Transthoracic echocardiogram  01/2007  . Pacemaker lead removal N/A 08/21/2012    Procedure: PACEMAKER LEAD REMOVAL;  Surgeon: Evans Lance, MD;  Location: [facility] OR;  Service: Cardiovascular;  Laterality: N/A;  . Back surgery      x 2  . Joint replacement      right knee arthroplasty  . Insert / replace / remove pacemaker      x 3  . Colonoscopy    . Esophagogastroduodenoscopy    . Bilateral cataract surgery    . Right eye lasik    . Thoracotomy Left 10/24/2012    Procedure: THORACOTOMY MAJOR;  Surgeon: Gaye Pollack, MD;  Location: [facility] OR;  Service: Thoracic;  Laterality: Left;  . Epicardial pacing lead placement N/A 10/24/2012    Procedure: EPICARDIAL PACING LEAD PLACEMENT;  Surgeon: Gaye Pollack, MD;  Location: [facility] OR;  Service: Thoracic;  Laterality: N/A;  LV EPICARDIAL LEADS  . Lead revision N/A 09/30/2012    Procedure: LEAD REVISION;  Surgeon: Evans Lance, MD;  Location: [facility] CATH LAB;  Service: Cardiovascular;  Laterality: N/A;     Family History  Problem Relation Age of Onset  . Heart disease Mother      Social History   Social History  . Marital Status: Married    Spouse Name: N/A  . Number of Children: N/A  . Years of Education: N/A   Occupational History  . retired    Social History Main Topics  . Smoking status: Former Smoker    Quit date: 03/20/1968  . Smokeless tobacco: Never Used  . Alcohol Use: No  . Drug Use: No  . Sexual Activity: No   Other Topics Concern  . Not on file   Social History Narrative     BP 102/66 mmHg  Pulse 94  Ht 5\' 11"  (1.803 m)  Wt 205 lb (92.987 kg)  BMI 28.60 kg/m2  Physical Exam:  Well appearing 82 year old man, NAD HEENT: Unremarkable Neck:  7 cm JVD, no thyromegally Back:  No CVA tenderness Lungs:  Clear, with no wheezes, rales, or rhonchi. HEART:  Regular rate rhythm, no murmurs,  no rubs, no clicks Abd:  soft, positive bowel  sounds, no organomegally, no rebound, no guarding Ext:  2 plus pulses, no edema, no cyanosis, no clubbing Skin:  No rashes no nodules Neuro:  CN II through XII intact, motor grossly intact  EKG - P synchronous biventricular pacing  DEVICE  Normal device function.  See PaceArt for details. He is less than a year from ERI.  Assess/Plan: 1. Chronic systolic heart failure - his symptoms are class 2. He will continue his current meds. 2. HTN - his blood pressure is well controlled. No change in meds. 3. Biv PPM - his device is working normally with stable pacing thresholds in all chambers. He is less than a year from [employer].D.

## 2015-06-16 NOTE — Patient Instructions (Signed)

## 2015-07-16 ENCOUNTER — Other Ambulatory Visit: Payer: Self-pay | Admitting: Interventional Cardiology

## 2015-08-11 ENCOUNTER — Encounter: Payer: Self-pay | Admitting: Interventional Cardiology

## 2015-08-11 ENCOUNTER — Ambulatory Visit (INDEPENDENT_AMBULATORY_CARE_PROVIDER_SITE_OTHER): Payer: Medicare Other | Admitting: Interventional Cardiology

## 2015-08-11 VITALS — BP 100/60 | HR 96 | Ht 71.0 in | Wt 188.2 lb

## 2015-08-11 DIAGNOSIS — E669 Obesity, unspecified: Secondary | ICD-10-CM | POA: Diagnosis not present

## 2015-08-11 DIAGNOSIS — I5022 Chronic systolic (congestive) heart failure: Secondary | ICD-10-CM | POA: Diagnosis not present

## 2015-08-11 DIAGNOSIS — E782 Mixed hyperlipidemia: Secondary | ICD-10-CM

## 2015-08-11 DIAGNOSIS — I10 Essential (primary) hypertension: Secondary | ICD-10-CM

## 2015-08-11 NOTE — Patient Instructions (Signed)
**Note De-Identified  Obfuscation** Medication Instructions:  Same-no changes. Take first dose of Furosemide in the morning and the second dose around 2:00 pm.  Labwork: None  Testing/Procedures: None  Follow-Up: Your physician wants you to follow-up in: 1 year.  You will receive a reminder letter in the mail two months in advance. If you don't receive a letter, please call our office to schedule the follow-up appointment.     If you need a refill on your cardiac medications before your next appointment, please call your pharmacy.

## 2015-08-11 NOTE — Progress Notes (Signed)
Cardiology Office Note   Date:  08/11/2015   ID:  Louis Acosta, DOB 1931/03/29, MRN [id number]  PCP:  [name], MD    No chief complaint on file.  Chronic systolic heart failure  Wt Readings from Last 3 Encounters:  08/11/15 188 lb 3.2 oz (85.367 kg)  06/16/15 205 lb (92.987 kg)  08/05/14 216 lb 12.8 oz (98.34 kg)       History of Present Illness: Louis Acosta is a 82 y.o. male  who had an anterior MI, and BMS to the LAD in 2007. He had a cardiac cath in 2012 showing a patent LAD stent. BP not checked much. He did not check his BP on the lower metoprolol. He felt fatigue in metoprolol. He takes a nap in the evening and feels better.  he is now on supplemental oxygen. He feels that his balance is off. He has persistent back pain and is not a surgical candidate. Avoiding salt.  Stable DOE.  Maintaining himself on less diuretic.  He has lost weight from decreased appetite.    He feels fatigued. He is requiring oxygen more during the day. He is not as active. His wife is trying to give him protein drinks to help with his nutritional status.    Past Medical History  Diagnosis Date  . Cardiomyopathy     broken 3x's  . History of fracture of nose   . Hyperlipidemia     takes Simvastatin daily  . Arthritis   . Seasonal allergies     uses Flonase prn and takes Claritin prn  . Dysrhythmia     HX OF COMPLETE HEART BLOCK;takes Metoprolol daily  . Hypertension     takes Losartan daily  . CHF (congestive heart failure) (HCC)     takes Furosemide daily  . PONV (postoperative nausea and vomiting)   . Pacemaker     St Jude  . Anginal pain ([place])     within the last 2 wks has taken Ntg 1 time  . Shortness of breath     lying and with exertion  . Confusion     short term memory loss  . Joint pain   . Chronic back pain   . Skin spots, red   . H/O hiatal hernia   . History of colon polyps   . Diverticulosis   . Enlarged prostate     takes [place]  . Nocturia   . Diabetes mellitus     type 2;takes Lantus nightly and Humalog prn  . Glaucoma     uses several drops  . History of shingles   . History of staph infection     50+yrs ago    Past Surgical History  Procedure Laterality Date  . Transthoracic echocardiogram  01/2007  . Pacemaker lead removal N/A 08/21/2012    Procedure: PACEMAKER LEAD REMOVAL;  Surgeon: Evans Lance, MD;  Location: [facility] OR;  Service: Cardiovascular;  Laterality: N/A;  . Back surgery      x 2  . Joint replacement      right knee arthroplasty  . Insert / replace / remove pacemaker      x 3  . Colonoscopy    . Esophagogastroduodenoscopy    . Bilateral cataract surgery    . Right eye lasik    . Thoracotomy Left 10/24/2012    Procedure: THORACOTOMY MAJOR;  Surgeon: Gaye Pollack, MD;  Location: [facility] OR;  Service: Thoracic;  Laterality: Left;  .  Epicardial pacing lead placement N/A 10/24/2012    Procedure: EPICARDIAL PACING LEAD PLACEMENT;  Surgeon: Gaye Pollack, MD;  Location: [facility] OR;  Service: Thoracic;  Laterality: N/A;  LV EPICARDIAL LEADS  . Lead revision N/A 09/30/2012    Procedure: LEAD REVISION;  Surgeon: Evans Lance, MD;  Location: [facility] CATH LAB;  Service: Cardiovascular;  Laterality: N/A;     Current Outpatient Prescriptions  Medication Sig Dispense Refill  . ACCU-CHEK AVIVA PLUS test strip 1 each by Other route as needed.     Marland Kitchen ACCU-CHEK FASTCLIX LANCETS MISC Apply 1 each topically as directed.     Marland Kitchen acetaminophen (TYLENOL) 650 MG CR tablet Take 650 mg by mouth every 8 (eight) hours as needed for pain.    . B Complex Vitamins (VITAMIN-B COMPLEX PO) Take 1 tablet by mouth daily.     . brimonidine-timolol (COMBIGAN) 0.2-0.5 % ophthalmic solution Place 1 drop into the right eye every 12 (twelve) hours.    . dorzolamide (TRUSOPT) 2 % ophthalmic solution Place 1 drop into the right eye 2 (two) times daily.     . fluPHENAZine (PROLIXIN) 5 MG tablet Take 2.5 mg by mouth 2 (two) times daily.      . fluticasone (FLONASE) 50 MCG/ACT nasal spray Place 2 sprays into the nose daily.     . furosemide (LASIX) 40 MG tablet Take 40 mg by mouth 2 (two) times daily. Take three (3) tablets (120 mg total) by mouth twice daily. TAKE 3  TABS IN THE A.M 2  IN THE P.M.    . gabapentin (NEURONTIN) 100 MG capsule Take 100-200 mg by mouth 3 (three) times daily.    . insulin lispro (HUMALOG) 100 UNIT/ML injection Inject 15-20 Units into the skin daily as needed for high blood sugar.     . latanoprost (XALATAN) 0.005 % ophthalmic solution Place 1 drop into the left eye at bedtime.     Marland Kitchen levothyroxine (SYNTHROID, LEVOTHROID) 50 MCG tablet Take 1 tablet by mouth daily before breakfast. Take on an empty stomach    . loratadine (CLARITIN) 10 MG tablet Take 10 mg by mouth daily as needed for allergies.     Marland Kitchen losartan (COZAAR) 25 MG tablet TAKE 1 TABLET BY MOUTH DAILY 30 tablet 11  . metoCLOPramide (REGLAN) 5 MG tablet Take 5 mg by mouth daily.    . Misc Natural Products (OSTEO BI-FLEX TRIPLE STRENGTH PO) Take 2 tablets by mouth daily.     . Multiple Vitamins-Minerals (MULTIVITAMIN WITH MINERALS) tablet Take 1 tablet by mouth daily.     Marland Kitchen NITROSTAT 0.4 MG SL tablet TAKE 1 TABLET UNDER THE TONGUE AS NEEDEDFOR CHEST PAIN (MAY REPEAT EVERY 5 MIN X3 DOSES, CALL 911 IF NO RELIEF.) 25 tablet 1  . Omega-3 1400 MG CAPS Take 2 capsules by mouth daily.    Vladimir Faster Glycol-Propyl Glycol (SYSTANE ULTRA OP) Place 1 drop into the left eye every morning.     . potassium gluconate 595 MG TABS Take 595 mg by mouth daily.    . prednisoLONE acetate (PRED FORTE) 1 % ophthalmic suspension Place 1 drop into the right eye every 2 (two) hours while awake.     . Saw Palmetto 450 MG CAPS Take 450 mg by mouth 2 (two) times daily.    . simvastatin (ZOCOR) 40 MG tablet Take 40 mg by mouth every evening.     Marland Kitchen spironolactone (ALDACTONE) 25 MG tablet Take 25 mg by mouth once. 1 IN A.M.    .  TOUJEO SOLOSTAR 300 UNIT/ML SOPN Inject 70 Units into  the skin at bedtime.    . travoprost, benzalkonium, (TRAVATAN) 0.004 % ophthalmic solution Place 1 drop into the right eye at bedtime.      No current facility-administered medications for this visit.    Allergies:   Clindamycin/lincomycin    Social History:  The patient  reports that he quit smoking about 47 years ago. He has never used smokeless tobacco. He reports that he does not drink alcohol or use illicit drugs.   Family History:  The patient's family history includes Heart disease in his mother.    ROS:  Please see the history of present illness.   Otherwise, review of systems are positive for fatigue.   All other systems are reviewed and negative.    PHYSICAL EXAM: VS:  BP 100/60 mmHg  Pulse 96  Ht 5\' 11"  (1.803 m)  Wt 188 lb 3.2 oz (85.367 kg)  BMI 26.26 kg/m2 , BMI Body mass index is 26.26 kg/(m^2). GEN: Well nourished, well developed, in no acute distress HEENT: normal Neck: no JVD, carotid bruits, or masses Cardiac: RRR; no murmurs, rubs, or gallops,no edema  Respiratory:  clear to auscultation bilaterally, normal work of breathing GI: soft, nontender, nondistended, + BS MS: no deformity or atrophy Skin: warm and dry, no rash Neuro:  Strength and sensation are intact Psych: euthymic mood, full affect     Recent Labs: No results found for requested labs within last 365 days.   Lipid Panel No results found for: CHOL, TRIG, HDL, CHOLHDL, VLDL, LDLCALC, LDLDIRECT   Other studies Reviewed: Additional studies/ records that were reviewed today with results demonstrating: Dr. Tanna Furry note reviewed..   ASSESSMENT AND PLAN:  1. Cardiomyopathy: Nonischemic.  No evidence of fluid overload despite his chronic systolic heart failure.  I asked him to move his evening dose of Lasix to the afternoon. Hopefully, this will allow him to sleep better without having to get up to urinate as much. 2. Obesity: He has lost weight. His appetite is decreased. He is requiring less  medication.  Fatigue is likely multifactorial. He is deconditioned and now is requiring chronic oxygen therapy. 3. HTN: He requires less medication and his blood pressure is well controlled. 4. Hyperlipidemia: Followed by his primary care physician. Continue lipid-lowering therapy.   Current medicines are reviewed at length with the patient today.  The patient concerns regarding his medicines were addressed.  The following changes have been made:  Move Lasix to afternoon  Labs/ tests ordered today include:  No orders of the defined types were placed in this encounter.    Recommend 150 minutes/week of aerobic exercise Low fat, low carb, high fiber diet recommended  Disposition:   FU in 1 year   Signed, Larae Grooms, MD  08/11/2015 2:00 PM    [address], [place], [place]  [address] Phone: [phone]; Fax: [phone]

## 2015-09-07 ENCOUNTER — Encounter: Payer: Self-pay | Admitting: Internal Medicine

## 2015-09-15 ENCOUNTER — Ambulatory Visit (INDEPENDENT_AMBULATORY_CARE_PROVIDER_SITE_OTHER): Payer: Medicare Other | Admitting: *Deleted

## 2015-09-15 DIAGNOSIS — I442 Atrioventricular block, complete: Secondary | ICD-10-CM | POA: Diagnosis not present

## 2015-09-15 NOTE — Progress Notes (Signed)
Remote pacemaker transmission.   

## 2015-09-17 ENCOUNTER — Encounter: Payer: Self-pay | Admitting: Cardiology

## 2015-09-23 LAB — CUP PACEART REMOTE DEVICE CHECK
Battery Remaining Longevity: 6 mo
Battery Remaining Percentage: 8 %
Battery Voltage: 2.69 V
Brady Statistic AP VP Percent: 1 %
Brady Statistic AS VP Percent: 97 %
Brady Statistic RA Percent Paced: 1 %
Implantable Lead Implant Date: 20041123
Implantable Lead Location: 753858
Implantable Lead Location: 753859
Implantable Lead Model: 5076
Implantable Lead Model: 511212
Lead Channel Impedance Value: 280 Ohm
Lead Channel Impedance Value: 410 Ohm
Lead Channel Pacing Threshold Amplitude: 1.25 V
Lead Channel Pacing Threshold Pulse Width: 0.5 ms
Lead Channel Pacing Threshold Pulse Width: 0.6 ms
Lead Channel Sensing Intrinsic Amplitude: 10.1 mV
Lead Channel Sensing Intrinsic Amplitude: 4.2 mV
Lead Channel Setting Pacing Amplitude: 2 V
Lead Channel Setting Pacing Amplitude: 2.5 V
Lead Channel Setting Pacing Amplitude: 2.5 V
Lead Channel Setting Pacing Pulse Width: 0.6 ms
Lead Channel Setting Pacing Pulse Width: 0.6 ms
Lead Channel Setting Sensing Sensitivity: 3.5 mV
MDC IDC LEAD IMPLANT DT: 20041123
MDC IDC LEAD IMPLANT DT: 20140807
MDC IDC LEAD LOCATION: 753860
MDC IDC LEAD SERIAL: 190354
MDC IDC MSMT LEADCHNL LV PACING THRESHOLD AMPLITUDE: 1.25 V
MDC IDC MSMT LEADCHNL LV PACING THRESHOLD PULSEWIDTH: 0.6 ms
MDC IDC MSMT LEADCHNL RA PACING THRESHOLD AMPLITUDE: 1 V
MDC IDC MSMT LEADCHNL RV IMPEDANCE VALUE: 410 Ohm
MDC IDC PG SERIAL: 2376403
MDC IDC SESS DTM: 20170628060659
MDC IDC STAT BRADY AP VS PERCENT: 1 %
MDC IDC STAT BRADY AS VS PERCENT: 2.5 %

## 2015-12-03 ENCOUNTER — Telehealth: Payer: Self-pay

## 2015-12-03 DIAGNOSIS — I5022 Chronic systolic (congestive) heart failure: Secondary | ICD-10-CM

## 2015-12-03 DIAGNOSIS — I10 Essential (primary) hypertension: Secondary | ICD-10-CM

## 2015-12-03 NOTE — Telephone Encounter (Signed)
Spoke to patient.Message sent to Dr.Varansi earlier today about 7.5 lbs in 5 days.Dr.Varansi out of office today.Spoke to [place] he advised pt needs bmet today if possible before we increase lasix dose.Patient stated he cannot come today.Stated he will have bmet done Mon 9/18.Stated he feels good, no sob,no swelling.Advised we will call back after Dr.Varansi reviews.Advised if he gets sob over the weekend to go to ER.

## 2015-12-03 NOTE — Telephone Encounter (Signed)
Received a call from [facility] from [facility] calling to report patient has gained 7.5 lbs in 5 days.Spoke to patient's wife she stated patient has no edema,no sob.Stated he decreased fluid intake yesterday to 64 oz daily.Takes Lasix 40 mg 3 in am,2 pm,1 night.Takes Aldactone 25 mg daily.Message sent to Dr.Varanasi for advice.

## 2015-12-06 ENCOUNTER — Other Ambulatory Visit: Payer: Medicare Other

## 2015-12-13 ENCOUNTER — Inpatient Hospital Stay (HOSPITAL_COMMUNITY)
Admission: EM | Admit: 2015-12-13 | Discharge: 2016-01-19 | DRG: 871 | Disposition: E | Payer: Medicare Other | Attending: Internal Medicine | Admitting: Internal Medicine

## 2015-12-13 ENCOUNTER — Encounter (HOSPITAL_COMMUNITY): Payer: Self-pay | Admitting: Emergency Medicine

## 2015-12-13 ENCOUNTER — Emergency Department (HOSPITAL_COMMUNITY): Payer: Medicare Other

## 2015-12-13 DIAGNOSIS — N179 Acute kidney failure, unspecified: Secondary | ICD-10-CM | POA: Diagnosis present

## 2015-12-13 DIAGNOSIS — M549 Dorsalgia, unspecified: Secondary | ICD-10-CM | POA: Diagnosis present

## 2015-12-13 DIAGNOSIS — J9601 Acute respiratory failure with hypoxia: Secondary | ICD-10-CM | POA: Diagnosis not present

## 2015-12-13 DIAGNOSIS — I252 Old myocardial infarction: Secondary | ICD-10-CM

## 2015-12-13 DIAGNOSIS — Z66 Do not resuscitate: Secondary | ICD-10-CM | POA: Diagnosis present

## 2015-12-13 DIAGNOSIS — I248 Other forms of acute ischemic heart disease: Secondary | ICD-10-CM | POA: Diagnosis present

## 2015-12-13 DIAGNOSIS — Z96651 Presence of right artificial knee joint: Secondary | ICD-10-CM | POA: Diagnosis present

## 2015-12-13 DIAGNOSIS — G9341 Metabolic encephalopathy: Secondary | ICD-10-CM | POA: Diagnosis present

## 2015-12-13 DIAGNOSIS — J9612 Chronic respiratory failure with hypercapnia: Secondary | ICD-10-CM | POA: Diagnosis present

## 2015-12-13 DIAGNOSIS — J9621 Acute and chronic respiratory failure with hypoxia: Secondary | ICD-10-CM | POA: Diagnosis present

## 2015-12-13 DIAGNOSIS — R34 Anuria and oliguria: Secondary | ICD-10-CM | POA: Diagnosis not present

## 2015-12-13 DIAGNOSIS — T17908D Unspecified foreign body in respiratory tract, part unspecified causing other injury, subsequent encounter: Secondary | ICD-10-CM | POA: Diagnosis not present

## 2015-12-13 DIAGNOSIS — J302 Other seasonal allergic rhinitis: Secondary | ICD-10-CM | POA: Diagnosis present

## 2015-12-13 DIAGNOSIS — R0902 Hypoxemia: Secondary | ICD-10-CM

## 2015-12-13 DIAGNOSIS — Z950 Presence of cardiac pacemaker: Secondary | ICD-10-CM | POA: Diagnosis present

## 2015-12-13 DIAGNOSIS — D696 Thrombocytopenia, unspecified: Secondary | ICD-10-CM | POA: Diagnosis present

## 2015-12-13 DIAGNOSIS — H409 Unspecified glaucoma: Secondary | ICD-10-CM | POA: Diagnosis present

## 2015-12-13 DIAGNOSIS — J189 Pneumonia, unspecified organism: Secondary | ICD-10-CM | POA: Diagnosis present

## 2015-12-13 DIAGNOSIS — G8929 Other chronic pain: Secondary | ICD-10-CM | POA: Diagnosis present

## 2015-12-13 DIAGNOSIS — Z881 Allergy status to other antibiotic agents status: Secondary | ICD-10-CM

## 2015-12-13 DIAGNOSIS — E785 Hyperlipidemia, unspecified: Secondary | ICD-10-CM

## 2015-12-13 DIAGNOSIS — J181 Lobar pneumonia, unspecified organism: Secondary | ICD-10-CM | POA: Diagnosis present

## 2015-12-13 DIAGNOSIS — I429 Cardiomyopathy, unspecified: Secondary | ICD-10-CM | POA: Diagnosis present

## 2015-12-13 DIAGNOSIS — E1140 Type 2 diabetes mellitus with diabetic neuropathy, unspecified: Secondary | ICD-10-CM | POA: Diagnosis present

## 2015-12-13 DIAGNOSIS — Z95810 Presence of automatic (implantable) cardiac defibrillator: Secondary | ICD-10-CM

## 2015-12-13 DIAGNOSIS — E039 Hypothyroidism, unspecified: Secondary | ICD-10-CM | POA: Diagnosis present

## 2015-12-13 DIAGNOSIS — G9340 Encephalopathy, unspecified: Secondary | ICD-10-CM | POA: Diagnosis not present

## 2015-12-13 DIAGNOSIS — N183 Chronic kidney disease, stage 3 unspecified: Secondary | ICD-10-CM | POA: Diagnosis present

## 2015-12-13 DIAGNOSIS — I5022 Chronic systolic (congestive) heart failure: Secondary | ICD-10-CM | POA: Diagnosis not present

## 2015-12-13 DIAGNOSIS — K449 Diaphragmatic hernia without obstruction or gangrene: Secondary | ICD-10-CM | POA: Diagnosis present

## 2015-12-13 DIAGNOSIS — E872 Acidosis: Secondary | ICD-10-CM | POA: Diagnosis present

## 2015-12-13 DIAGNOSIS — N400 Enlarged prostate without lower urinary tract symptoms: Secondary | ICD-10-CM | POA: Diagnosis present

## 2015-12-13 DIAGNOSIS — Z8619 Personal history of other infectious and parasitic diseases: Secondary | ICD-10-CM

## 2015-12-13 DIAGNOSIS — D631 Anemia in chronic kidney disease: Secondary | ICD-10-CM | POA: Diagnosis present

## 2015-12-13 DIAGNOSIS — E1122 Type 2 diabetes mellitus with diabetic chronic kidney disease: Secondary | ICD-10-CM | POA: Diagnosis present

## 2015-12-13 DIAGNOSIS — I130 Hypertensive heart and chronic kidney disease with heart failure and stage 1 through stage 4 chronic kidney disease, or unspecified chronic kidney disease: Secondary | ICD-10-CM | POA: Diagnosis present

## 2015-12-13 DIAGNOSIS — R778 Other specified abnormalities of plasma proteins: Secondary | ICD-10-CM | POA: Diagnosis present

## 2015-12-13 DIAGNOSIS — Z794 Long term (current) use of insulin: Secondary | ICD-10-CM

## 2015-12-13 DIAGNOSIS — Z79899 Other long term (current) drug therapy: Secondary | ICD-10-CM

## 2015-12-13 DIAGNOSIS — I10 Essential (primary) hypertension: Secondary | ICD-10-CM | POA: Diagnosis present

## 2015-12-13 DIAGNOSIS — I5043 Acute on chronic combined systolic (congestive) and diastolic (congestive) heart failure: Secondary | ICD-10-CM | POA: Diagnosis present

## 2015-12-13 DIAGNOSIS — Z955 Presence of coronary angioplasty implant and graft: Secondary | ICD-10-CM

## 2015-12-13 DIAGNOSIS — E875 Hyperkalemia: Secondary | ICD-10-CM | POA: Diagnosis present

## 2015-12-13 DIAGNOSIS — T17908A Unspecified foreign body in respiratory tract, part unspecified causing other injury, initial encounter: Secondary | ICD-10-CM | POA: Diagnosis present

## 2015-12-13 DIAGNOSIS — Z515 Encounter for palliative care: Secondary | ICD-10-CM | POA: Diagnosis not present

## 2015-12-13 DIAGNOSIS — R7989 Other specified abnormal findings of blood chemistry: Secondary | ICD-10-CM

## 2015-12-13 DIAGNOSIS — E782 Mixed hyperlipidemia: Secondary | ICD-10-CM | POA: Diagnosis present

## 2015-12-13 DIAGNOSIS — E1121 Type 2 diabetes mellitus with diabetic nephropathy: Secondary | ICD-10-CM | POA: Diagnosis present

## 2015-12-13 DIAGNOSIS — R0602 Shortness of breath: Secondary | ICD-10-CM

## 2015-12-13 DIAGNOSIS — Z8249 Family history of ischemic heart disease and other diseases of the circulatory system: Secondary | ICD-10-CM

## 2015-12-13 DIAGNOSIS — I5033 Acute on chronic diastolic (congestive) heart failure: Secondary | ICD-10-CM

## 2015-12-13 DIAGNOSIS — Z8701 Personal history of pneumonia (recurrent): Secondary | ICD-10-CM

## 2015-12-13 DIAGNOSIS — R1312 Dysphagia, oropharyngeal phase: Secondary | ICD-10-CM

## 2015-12-13 DIAGNOSIS — E1169 Type 2 diabetes mellitus with other specified complication: Secondary | ICD-10-CM | POA: Diagnosis not present

## 2015-12-13 DIAGNOSIS — I5021 Acute systolic (congestive) heart failure: Secondary | ICD-10-CM | POA: Diagnosis not present

## 2015-12-13 DIAGNOSIS — A419 Sepsis, unspecified organism: Secondary | ICD-10-CM | POA: Diagnosis present

## 2015-12-13 DIAGNOSIS — I2510 Atherosclerotic heart disease of native coronary artery without angina pectoris: Secondary | ICD-10-CM | POA: Diagnosis present

## 2015-12-13 DIAGNOSIS — D72829 Elevated white blood cell count, unspecified: Secondary | ICD-10-CM | POA: Diagnosis present

## 2015-12-13 DIAGNOSIS — R748 Abnormal levels of other serum enzymes: Secondary | ICD-10-CM | POA: Diagnosis not present

## 2015-12-13 DIAGNOSIS — E1321 Other specified diabetes mellitus with diabetic nephropathy: Secondary | ICD-10-CM

## 2015-12-13 DIAGNOSIS — I509 Heart failure, unspecified: Secondary | ICD-10-CM | POA: Diagnosis not present

## 2015-12-13 DIAGNOSIS — L8990 Pressure ulcer of unspecified site, unspecified stage: Secondary | ICD-10-CM | POA: Diagnosis present

## 2015-12-13 DIAGNOSIS — Z87891 Personal history of nicotine dependence: Secondary | ICD-10-CM

## 2015-12-13 DIAGNOSIS — R262 Difficulty in walking, not elsewhere classified: Secondary | ICD-10-CM

## 2015-12-13 LAB — CBC
HEMATOCRIT: 35.2 % — AB (ref 39.0–52.0)
HEMOGLOBIN: 11.5 g/dL — AB (ref 13.0–17.0)
MCH: 32.6 pg (ref 26.0–34.0)
MCHC: 32.7 g/dL (ref 30.0–36.0)
MCV: 99.7 fL (ref 78.0–100.0)
PLATELETS: 148 10*3/uL — AB (ref 150–400)
RBC: 3.53 MIL/uL — ABNORMAL LOW (ref 4.22–5.81)
RDW: 15.4 % (ref 11.5–15.5)
WBC: 22.7 10*3/uL — ABNORMAL HIGH (ref 4.0–10.5)

## 2015-12-13 LAB — BASIC METABOLIC PANEL
Anion gap: 10 (ref 5–15)
BUN: 52 mg/dL — ABNORMAL HIGH (ref 6–20)
CO2: 28 mmol/L (ref 22–32)
CREATININE: 1.86 mg/dL — AB (ref 0.61–1.24)
Calcium: 9.6 mg/dL (ref 8.9–10.3)
Chloride: 97 mmol/L — ABNORMAL LOW (ref 101–111)
GFR calc Af Amer: 37 mL/min — ABNORMAL LOW (ref >60)
GFR calc non Af Amer: 32 mL/min — ABNORMAL LOW (ref >60)
Glucose, Bld: 150 mg/dL — ABNORMAL HIGH (ref 65–99)
POTASSIUM: 4.8 mmol/L (ref 3.5–5.1)
Sodium: 135 mmol/L (ref 135–145)

## 2015-12-13 LAB — DIFFERENTIAL
Basophils Absolute: 0 10*3/uL (ref 0.0–0.1)
Basophils Relative: 0 %
EOS ABS: 0 10*3/uL (ref 0.0–0.7)
EOS PCT: 0 %
LYMPHS ABS: 1.3 10*3/uL (ref 0.7–4.0)
LYMPHS PCT: 6 %
MONO ABS: 1.5 10*3/uL — AB (ref 0.1–1.0)
Monocytes Relative: 7 %
Neutro Abs: 18.8 10*3/uL — ABNORMAL HIGH (ref 1.7–7.7)
Neutrophils Relative %: 87 %

## 2015-12-13 LAB — TROPONIN I
TROPONIN I: 0.2 ng/mL — AB (ref <0.03)
Troponin I: 0.18 ng/mL (ref <0.03)

## 2015-12-13 LAB — CBG MONITORING, ED: Glucose-Capillary: 143 mg/dL — ABNORMAL HIGH (ref 65–99)

## 2015-12-13 LAB — GLUCOSE, CAPILLARY: GLUCOSE-CAPILLARY: 203 mg/dL — AB (ref 65–99)

## 2015-12-13 LAB — PHOSPHORUS: PHOSPHORUS: 3.8 mg/dL (ref 2.5–4.6)

## 2015-12-13 LAB — MAGNESIUM: Magnesium: 2.1 mg/dL (ref 1.7–2.4)

## 2015-12-13 LAB — PROCALCITONIN: Procalcitonin: 1.48 ng/mL

## 2015-12-13 LAB — I-STAT CG4 LACTIC ACID, ED: LACTIC ACID, VENOUS: 2.7 mmol/L — AB (ref 0.5–1.9)

## 2015-12-13 LAB — BRAIN NATRIURETIC PEPTIDE: B Natriuretic Peptide: 2469.3 pg/mL — ABNORMAL HIGH (ref 0.0–100.0)

## 2015-12-13 LAB — MRSA PCR SCREENING: MRSA BY PCR: NEGATIVE

## 2015-12-13 MED ORDER — VANCOMYCIN HCL 10 G IV SOLR: 1250.0000 mg | INTRAVENOUS | Status: DC

## 2015-12-13 MED ORDER — ADULT MULTIVITAMIN W/MINERALS CH
1.0000 | ORAL_TABLET | Freq: Every day | ORAL | Status: DC
  Administered 2015-12-14 – 2015-12-17 (×4): 1 via ORAL
  Filled 2015-12-13 (×5): qty 1

## 2015-12-13 MED ORDER — POLYVINYL ALCOHOL 1.4 % OP SOLN
1.0000 [drp] | Freq: Every day | OPHTHALMIC | Status: DC
  Administered 2015-12-14 – 2015-12-25 (×11): 1 [drp] via OPHTHALMIC
  Filled 2015-12-13: qty 15

## 2015-12-13 MED ORDER — VANCOMYCIN HCL IN DEXTROSE 1-5 GM/200ML-% IV SOLN
1000.0000 mg | Freq: Once | INTRAVENOUS | Status: AC
  Administered 2015-12-13: 1000 mg via INTRAVENOUS
  Filled 2015-12-13: qty 200

## 2015-12-13 MED ORDER — ONDANSETRON HCL 4 MG/2ML IJ SOLN: 4.0000 mg | Freq: Four times a day (QID) | INTRAMUSCULAR | Status: DC | PRN

## 2015-12-13 MED ORDER — LORATADINE 10 MG PO TABS: 10.0000 mg | ORAL_TABLET | Freq: Every day | ORAL | Status: DC | PRN

## 2015-12-13 MED ORDER — DEXTROSE 5 % IV SOLN: 2.0000 g | Freq: Once | INTRAVENOUS | Status: DC

## 2015-12-13 MED ORDER — TRAVOPROST 0.004 % OP SOLN: 1.0000 [drp] | Freq: Every day | OPHTHALMIC | Status: DC

## 2015-12-13 MED ORDER — LEVOFLOXACIN IN D5W 750 MG/150ML IV SOLN: 750.0000 mg | INTRAVENOUS | Status: DC

## 2015-12-13 MED ORDER — VANCOMYCIN HCL IN DEXTROSE 1-5 GM/200ML-% IV SOLN: 1000.0000 mg | Freq: Once | INTRAVENOUS | Status: DC

## 2015-12-13 MED ORDER — OMEGA-3-ACID ETHYL ESTERS 1 G PO CAPS
2.0000 g | ORAL_CAPSULE | Freq: Every day | ORAL | Status: DC
  Administered 2015-12-14 – 2015-12-17 (×4): 2 g via ORAL
  Filled 2015-12-13 (×4): qty 2

## 2015-12-13 MED ORDER — GABAPENTIN 100 MG PO CAPS
100.0000 mg | ORAL_CAPSULE | Freq: Three times a day (TID) | ORAL | Status: DC
  Administered 2015-12-13 – 2015-12-17 (×13): 100 mg via ORAL
  Filled 2015-12-13 (×13): qty 1

## 2015-12-13 MED ORDER — LEVALBUTEROL HCL 1.25 MG/0.5ML IN NEBU
1.2500 mg | INHALATION_SOLUTION | RESPIRATORY_TRACT | Status: DC | PRN
  Filled 2015-12-13: qty 0.5

## 2015-12-13 MED ORDER — VANCOMYCIN HCL 500 MG IV SOLR
500.0000 mg | INTRAVENOUS | Status: DC
  Filled 2015-12-13: qty 500

## 2015-12-13 MED ORDER — FLUTICASONE PROPIONATE 50 MCG/ACT NA SUSP
2.0000 | Freq: Every day | NASAL | Status: DC
  Administered 2015-12-14 – 2015-12-24 (×9): 2 via NASAL
  Filled 2015-12-13: qty 16

## 2015-12-13 MED ORDER — OMEGA-3 1400 MG PO CAPS: 2.0000 | ORAL_CAPSULE | Freq: Every day | ORAL | Status: DC

## 2015-12-13 MED ORDER — IPRATROPIUM BROMIDE 0.02 % IN SOLN: 0.5000 mg | RESPIRATORY_TRACT | Status: DC | PRN

## 2015-12-13 MED ORDER — BRIMONIDINE TARTRATE-TIMOLOL 0.2-0.5 % OP SOLN: 1.0000 [drp] | Freq: Two times a day (BID) | OPHTHALMIC | Status: DC

## 2015-12-13 MED ORDER — DEXTROSE 5 % IV SOLN
1.0000 g | INTRAVENOUS | Status: DC
  Administered 2015-12-14 – 2015-12-16 (×3): 1 g via INTRAVENOUS
  Filled 2015-12-13 (×3): qty 1

## 2015-12-13 MED ORDER — ACETAMINOPHEN 325 MG PO TABS
650.0000 mg | ORAL_TABLET | Freq: Three times a day (TID) | ORAL | Status: DC | PRN
Start: 2015-12-13 — End: 2015-12-25
  Administered 2015-12-15: 650 mg via ORAL
  Filled 2015-12-13: qty 2

## 2015-12-13 MED ORDER — POLYETHYL GLYCOL-PROPYL GLYCOL 0.4-0.3 % OP SOLN: Freq: Every morning | OPHTHALMIC | Status: DC

## 2015-12-13 MED ORDER — INSULIN GLARGINE 100 UNIT/ML ~~LOC~~ SOLN
15.0000 [IU] | Freq: Every day | SUBCUTANEOUS | Status: DC
  Administered 2015-12-13 – 2015-12-18 (×6): 15 [IU] via SUBCUTANEOUS
  Filled 2015-12-13 (×7): qty 0.15

## 2015-12-13 MED ORDER — SODIUM CHLORIDE 0.9 % IV BOLUS (SEPSIS): 1000.0000 mL | Freq: Once | INTRAVENOUS | Status: DC

## 2015-12-13 MED ORDER — ACETAMINOPHEN 325 MG PO TABS
650.0000 mg | ORAL_TABLET | Freq: Once | ORAL | Status: AC
  Administered 2015-12-13: 650 mg via ORAL
  Filled 2015-12-13: qty 2

## 2015-12-13 MED ORDER — LEVOFLOXACIN IN D5W 750 MG/150ML IV SOLN
750.0000 mg | Freq: Once | INTRAVENOUS | Status: DC
  Administered 2015-12-13: 750 mg via INTRAVENOUS
  Filled 2015-12-13: qty 150

## 2015-12-13 MED ORDER — LEVOTHYROXINE SODIUM 25 MCG PO TABS
25.0000 ug | ORAL_TABLET | Freq: Every day | ORAL | Status: DC
  Administered 2015-12-14 – 2015-12-17 (×4): 25 ug via ORAL
  Filled 2015-12-13 (×5): qty 1

## 2015-12-13 MED ORDER — LATANOPROST 0.005 % OP SOLN
1.0000 [drp] | Freq: Every day | OPHTHALMIC | Status: DC
  Administered 2015-12-13 – 2015-12-14 (×2): 1 [drp] via OPHTHALMIC
  Filled 2015-12-13: qty 2.5

## 2015-12-13 MED ORDER — BRIMONIDINE TARTRATE 0.2 % OP SOLN
1.0000 [drp] | Freq: Two times a day (BID) | OPHTHALMIC | Status: DC
  Filled 2015-12-13: qty 5

## 2015-12-13 MED ORDER — TIMOLOL MALEATE 0.5 % OP SOLN
1.0000 [drp] | Freq: Two times a day (BID) | OPHTHALMIC | Status: DC
  Filled 2015-12-13: qty 5

## 2015-12-13 MED ORDER — PREDNISOLONE ACETATE 1 % OP SUSP
1.0000 [drp] | Freq: Every day | OPHTHALMIC | Status: DC
  Administered 2015-12-13 – 2015-12-24 (×12): 1 [drp] via OPHTHALMIC
  Filled 2015-12-13 (×2): qty 1

## 2015-12-13 MED ORDER — SODIUM CHLORIDE 0.9 % IV SOLN
INTRAVENOUS | Status: DC
  Administered 2015-12-13 – 2015-12-24 (×3): via INTRAVENOUS

## 2015-12-13 MED ORDER — FLUPHENAZINE HCL 2.5 MG PO TABS
2.5000 mg | ORAL_TABLET | Freq: Two times a day (BID) | ORAL | Status: DC
  Administered 2015-12-13 – 2015-12-17 (×9): 2.5 mg via ORAL
  Filled 2015-12-13 (×10): qty 1

## 2015-12-13 MED ORDER — ONDANSETRON HCL 4 MG PO TABS
4.0000 mg | ORAL_TABLET | Freq: Four times a day (QID) | ORAL | Status: DC | PRN
Start: 2015-12-13 — End: 2015-12-25

## 2015-12-13 MED ORDER — SODIUM CHLORIDE 0.9 % IV BOLUS (SEPSIS)
1000.0000 mL | Freq: Once | INTRAVENOUS | Status: AC
  Administered 2015-12-13: 1000 mL via INTRAVENOUS

## 2015-12-13 MED ORDER — SIMVASTATIN 40 MG PO TABS
40.0000 mg | ORAL_TABLET | Freq: Every evening | ORAL | Status: DC
  Administered 2015-12-14 – 2015-12-16 (×3): 40 mg via ORAL
  Filled 2015-12-13 (×4): qty 1

## 2015-12-13 MED ORDER — DORZOLAMIDE HCL 2 % OP SOLN
1.0000 [drp] | Freq: Three times a day (TID) | OPHTHALMIC | Status: DC
  Administered 2015-12-13 – 2015-12-15 (×5): 1 [drp] via OPHTHALMIC

## 2015-12-13 MED ORDER — DORZOLAMIDE HCL 2 % OP SOLN
1.0000 [drp] | Freq: Two times a day (BID) | OPHTHALMIC | Status: DC
  Filled 2015-12-13: qty 10

## 2015-12-13 NOTE — ED Provider Notes (Signed)
[facility] DEPT Provider Note   CSN: [id number] Arrival date & time: 12/02/2015  1538     History   Chief Complaint Chief Complaint  Patient presents with  . Weakness    HPI Louis Acosta is a 82 y.o. male.  82 y/o male w/ 1 day h/o bilateral LE weakness Denies UE weakness, HA Denies back pain or fever Sx progressed today and now w/ diffuse weakness Pt chronically on 3L 02 and O2 sats 73% and still w/ worsening sob--some non-productive cough Denies anginal sx but has had DOE (has h/o CHF) Family called EMS and pt transported here      Past Medical History:  Diagnosis Date  . Anginal pain ([place])    within the last 2 wks has taken Ntg 1 time  . Arthritis   . Cardiomyopathy    broken 3x's  . CHF (congestive heart failure) (HCC)    takes Furosemide daily  . Chronic back pain   . Confusion    short term memory loss  . Diabetes mellitus    type 2;takes Lantus nightly and Humalog prn  . Diverticulosis   . Dysrhythmia    HX OF COMPLETE HEART BLOCK;takes Metoprolol daily  . Enlarged prostate    takes [employer]  . Glaucoma    uses several drops  . H/O hiatal hernia   . History of colon polyps   . History of fracture of nose   . History of shingles   . History of staph infection    50+yrs ago  . Hyperlipidemia    takes Simvastatin daily  . Hypertension    takes Losartan daily  . Joint pain   . Nocturia   . Pacemaker    St Jude  . PONV (postoperative nausea and vomiting)   . Seasonal allergies    uses Flonase prn and takes Claritin prn  . Shortness of breath    lying and with exertion  . Skin spots, red     Patient Active Problem List   Diagnosis Date Noted  . Obesity 07/23/2013  . Mixed hyperlipidemia 07/23/2013  . Angina effort ([place]) 03/28/2011  . CARDIAC PACEMAKER-[employer] 05/04/2009  . ESSENTIAL HYPERTENSION, BENIGN 01/05/2009  . CHRONIC SYSTOLIC HEART FAILURE [id number]    Past Surgical History:  Procedure Laterality Date  . BACK  SURGERY     x 2  . bilateral cataract surgery    . COLONOSCOPY    . EPICARDIAL PACING LEAD PLACEMENT N/A 10/24/2012   Procedure: EPICARDIAL PACING LEAD PLACEMENT;  Surgeon: Gaye Pollack, MD;  Location: [facility] OR;  Service: Thoracic;  Laterality: N/A;  LV EPICARDIAL LEADS  . ESOPHAGOGASTRODUODENOSCOPY    . INSERT / REPLACE / REMOVE PACEMAKER     x 3  . JOINT REPLACEMENT     right knee arthroplasty  . LEAD REVISION N/A 09/30/2012   Procedure: LEAD REVISION;  Surgeon: Evans Lance, MD;  Location: [facility] CATH LAB;  Service: Cardiovascular;  Laterality: N/A;  . PACEMAKER LEAD REMOVAL N/A 08/21/2012   Procedure: PACEMAKER LEAD REMOVAL;  Surgeon: Evans Lance, MD;  Location: [facility];  Service: Cardiovascular;  Laterality: N/A;  . right eye lasik    . THORACOTOMY Left 10/24/2012   Procedure: THORACOTOMY MAJOR;  Surgeon: Gaye Pollack, MD;  Location: [facility] OR;  Service: Thoracic;  Laterality: Left;  . TRANSTHORACIC ECHOCARDIOGRAM  01/2007       Home Medications    Prior to Admission medications   Medication Sig Start Date End  Date Taking? Authorizing Provider  ACCU-CHEK AVIVA PLUS test strip 1 each by Other route as needed.  07/21/15   Historical Provider, MD  ACCU-CHEK FASTCLIX LANCETS MISC Apply 1 each topically as directed.  07/21/15   Historical Provider, MD  acetaminophen (TYLENOL) 650 MG CR tablet Take 650 mg by mouth every 8 (eight) hours as needed for pain.    Historical Provider, MD  B Complex Vitamins (VITAMIN-B COMPLEX PO) Take 1 tablet by mouth daily.     Historical Provider, MD  brimonidine-timolol (COMBIGAN) 0.2-0.5 % ophthalmic solution Place 1 drop into the right eye every 12 (twelve) hours.    Historical Provider, MD  dorzolamide (TRUSOPT) 2 % ophthalmic solution Place 1 drop into the right eye 2 (two) times daily.  05/04/12   Historical Provider, MD  fluPHENAZine (PROLIXIN) 5 MG tablet Take 2.5 mg by mouth 2 (two) times daily. 07/14/15   Historical Provider, MD  fluticasone (FLONASE) 50  MCG/ACT nasal spray Place 2 sprays into the nose daily.  05/29/12   Historical Provider, MD  furosemide (LASIX) 40 MG tablet Take 40 mg by mouth 2 (two) times daily. Take three (3) tablets (120 mg total) by mouth twice daily. TAKE 3  TABS IN THE A.M 2  IN THE P.M.    Historical Provider, MD  gabapentin (NEURONTIN) 100 MG capsule Take 100-200 mg by mouth 3 (three) times daily.    Historical Provider, MD  insulin lispro (HUMALOG) 100 UNIT/ML injection Inject 15-20 Units into the skin daily as needed for high blood sugar.     Historical Provider, MD  latanoprost (XALATAN) 0.005 % ophthalmic solution Place 1 drop into the left eye at bedtime.  08/04/15   Historical Provider, MD  levothyroxine (SYNTHROID, LEVOTHROID) 50 MCG tablet Take 1 tablet by mouth daily before breakfast. Take on an empty stomach 05/22/14   Historical Provider, MD  loratadine (CLARITIN) 10 MG tablet Take 10 mg by mouth daily as needed for allergies.     Historical Provider, MD  losartan (COZAAR) 25 MG tablet TAKE 1 TABLET BY MOUTH DAILY 07/28/13   Jettie Booze, MD  metoCLOPramide (REGLAN) 5 MG tablet Take 5 mg by mouth daily. 07/06/14   Historical Provider, MD  Misc Natural Products (OSTEO BI-FLEX TRIPLE STRENGTH PO) Take 2 tablets by mouth daily.     Historical Provider, MD  Multiple Vitamins-Minerals (MULTIVITAMIN WITH MINERALS) tablet Take 1 tablet by mouth daily.     Historical Provider, MD  NITROSTAT 0.4 MG SL tablet TAKE 1 TABLET UNDER THE TONGUE AS NEEDEDFOR CHEST PAIN (MAY REPEAT EVERY 5 MIN X3 DOSES, CALL 911 IF NO RELIEF.) 07/16/15   Jettie Booze, MD  Omega-3 1400 MG CAPS Take 2 capsules by mouth daily.    Historical Provider, MD  Polyethyl Glycol-Propyl Glycol (SYSTANE ULTRA OP) Place 1 drop into the left eye every morning.     Historical Provider, MD  potassium gluconate 595 MG TABS Take 595 mg by mouth daily.    Historical Provider, MD  prednisoLONE acetate (PRED FORTE) 1 % ophthalmic suspension Place 1 drop into  the right eye every 2 (two) hours while awake.  08/04/15   Historical Provider, MD  Saw Palmetto 450 MG CAPS Take 450 mg by mouth 2 (two) times daily.    Historical Provider, MD  simvastatin (ZOCOR) 40 MG tablet Take 40 mg by mouth every evening.     Historical Provider, MD  spironolactone (ALDACTONE) 25 MG tablet Take 25 mg by mouth once. 1  IN A.M. 07/21/15   Historical Provider, MD  TOUJEO SOLOSTAR 300 UNIT/ML SOPN Inject 70 Units into the skin at bedtime. 04/29/14   Historical Provider, MD  travoprost, benzalkonium, (TRAVATAN) 0.004 % ophthalmic solution Place 1 drop into the right eye at bedtime.     Historical Provider, MD    Family History Family History  Problem Relation Age of Onset  . Heart disease Mother     Social History Social History  Substance Use Topics  . Smoking status: Former Smoker    Quit date: 03/20/1968  . Smokeless tobacco: Never Used  . Alcohol use No     Allergies   Clindamycin/lincomycin   Review of Systems Review of Systems  All other systems reviewed and are negative.    Physical Exam Updated Vital Signs BP 99/68   Pulse 106   Temp 100.2 F (37.9 C) (Rectal)   Resp 21   Ht 5\' 11"  (1.803 m)   Wt 86.2 kg   SpO2 (!) 87%   BMI 26.50 kg/m   Physical Exam  Constitutional: He is oriented to person, place, and time. He appears well-developed and well-nourished.  Non-toxic appearance. No distress.  HENT:  Head: Normocephalic and atraumatic.  Eyes: Conjunctivae, EOM and lids are normal. Pupils are equal, round, and reactive to light.  Neck: Normal range of motion. Neck supple. No tracheal deviation present. No thyroid mass present.  Cardiovascular: Normal rate, regular rhythm and normal heart sounds.  Exam reveals no gallop.   No murmur heard. Pulmonary/Chest: Effort normal. No stridor. Tachypnea noted. No respiratory distress. He has decreased breath sounds. He has no wheezes. He has no rhonchi. He has no rales.  Abdominal: Soft. Normal appearance  and bowel sounds are normal. He exhibits no distension. There is no tenderness. There is no rebound and no CVA tenderness.  Musculoskeletal: Normal range of motion. He exhibits no edema or tenderness.  2 + bilateral le edema  Neurological: He is alert and oriented to person, place, and time. He has normal strength. No cranial nerve deficit or sensory deficit. GCS eye subscore is 4. GCS verbal subscore is 5. GCS motor subscore is 6.  Skin: Skin is warm and dry. No abrasion and no rash noted.  Psychiatric: His affect is blunt. His speech is delayed. He is slowed.  Nursing note and vitals reviewed.    ED Treatments / Results  Labs (all labs ordered are listed, but only abnormal results are displayed) Labs Reviewed  CBG MONITORING, ED - Abnormal; Notable for the following:       Result Value   Glucose-Capillary 143 (*)    All other components within normal limits  CULTURE, BLOOD (ROUTINE X 2)  CULTURE, BLOOD (ROUTINE X 2)  URINE CULTURE  BASIC METABOLIC PANEL  CBC  URINALYSIS, ROUTINE W REFLEX MICROSCOPIC (NOT AT [facility])  CBC WITH DIFFERENTIAL/PLATELET  URINALYSIS, ROUTINE W REFLEX MICROSCOPIC (NOT AT [facility])  BRAIN NATRIURETIC PEPTIDE  TROPONIN I  I-STAT CG4 LACTIC ACID, ED    EKG  EKG Interpretation None       EKG Interpretation  Date/Time:  Monday December 13 2015 16:03:23 EDT Ventricular Rate:  105 PR Interval:    QRS Duration: 174 QT Interval:  404 QTC Calculation: 534 R Axis:   -161 Text Interpretation:  Ventricular-paced complexes No further analysis attempted due to paced rhythm Baseline wander in lead(s) V4 Confirmed by Demontre Padin  MD, Shaquna Geigle ([id number]) on 12/17/2015 4:22:23 PM        Radiology No results  found.  Procedures Procedures (including critical care time)  Medications Ordered in ED Medications  acetaminophen (TYLENOL) tablet 650 mg (not administered)     Initial Impression / Assessment and Plan / ED Course  I have reviewed the triage vital signs  and the nursing notes.  Pertinent labs & imaging results that were available during my care of the patient were reviewed by me and considered in my medical decision making (see chart for details).  Clinical Course    Pt started on abx for CAP, elevated lactate noted w/ soft BP, fluid bolus ordered, discussed results with family, will admit to step down  CRITICAL CARE Performed by: Leota Jacobsen Total critical care time: 50 minutes Critical care time was exclusive of separately billable procedures and treating other patients. Critical care was necessary to treat or prevent imminent or life-threatening deterioration. Critical care was time spent personally by me on the following activities: development of treatment plan with patient and/or surrogate as well as nursing, discussions with consultants, evaluation of patient's response to treatment, examination of patient, obtaining history from patient or surrogate, ordering and performing treatments and interventions, ordering and review of laboratory studies, ordering and review of radiographic studies, pulse oximetry and re-evaluation of patient's condition.   Final Clinical Impressions(s) / ED Diagnoses   Final diagnoses:  None    New Prescriptions New Prescriptions   No medications on file     Lacretia Leigh, MD 12/15/2015 [phone]

## 2015-12-13 NOTE — Progress Notes (Addendum)
Pharmacy Antibiotic Note  Louis Acosta is a 82 y.o. male admitted on 12/17/2015 with sepsis.  He reports 1 day hx of weakness and increased shortness of breath.  Code SEPSIS called. Pharmacy has been consulted for Vancomycin & Levaquin dosing. 11/30/2015:   Tm 100.27F  WBC, LA elevated  Acute renal failure: Scr 1.86 (est CrCl 30-42ml/hr)  Plan:  Levaquin 750mg  IV q48h  Vancomycin 1gm IV x1 given in ED.  Additional 500mg  IV now then 1250mg  IV q24h  Check Vancomycin trough at steady state  Monitor renal function and cx data   Height: 5\' 11"  (180.3 cm) Weight: 193 lb (87.5 kg) IBW/kg (Calculated) : 75.3  Temp (24hrs), Avg:99 F (37.2 C), Min:97.8 F (36.6 C), Max:100.2 F (37.9 C)   Recent Labs Lab 11/21/2015 1604 11/29/2015 1627  WBC 22.7*  --   CREATININE 1.86*  --   LATICACIDVEN  --  2.70*    Estimated Creatinine Clearance: 32.6 mL/min (by C-G formula based on SCr of 1.86 mg/dL (H)).    Allergies  Allergen Reactions  . Clindamycin/Lincomycin Nausea And Vomiting    Antimicrobials this admission: Levaquin 9/25 >>  Vanc 9/25 >>   Dose adjustments this admission:  Microbiology results: 9/25 BCx: sent 9/25 UCx: sent   Thank you for allowing pharmacy to be a part of this patient's care.  Netta Cedars, PharmD, BCPS Pager: [phone] 12/06/2015 5:24 PM   Addendum: Admitting MD changing Levaquin to Cefepime 1) Cefepime 1gm IV q24h- adjusted for renal fxn.  Netta Cedars, PharmD, BCPS Pager: [phone] 12/02/2015@7 :59 PM

## 2015-12-13 NOTE — ED Notes (Signed)
Patient made aware of urine sample. Urinal placed at bedside and encouraged to void when able.

## 2015-12-13 NOTE — H&P (Addendum)
History and Physical    Louis Acosta FMB:[id number] DOB: 12/30/1931 DOA: 11/22/2015  Referring MD/NP/PA: Dr. Lacretia Leigh   PCP: [name], MD   Outpatient Specialists: Cardiology, Dr. Irish Lack  Patient coming from: home   Chief Complaint: weakness, shortness of breath   HPI: Louis Acosta is a 82 y.o. male with medical history significant for hypertension, chronic combined CHF, has pacemaker, DM on insulin, dyslipidemia, CKD stage 3 with baseline Cr 1.9 in 05/2015. He comes from home and his son at the bedside provided most of the history . Pt was in his usual state of health, at baseline able to walk with the walker and cane but over past day he was not able to get up without the assistance. He could not ambulate without the assistance and he almost fell. He was also more short of breath especially when trying to get up. No chest pain, no palpitations. No reports of weight gain. No fevers, no chills. He does report intermittent non productive cough. No urinary complaints. No abdominal pain, nausea or vomiting.   ED Course: In ED, BP is 73/58, HR 108, RR 21-29, T max 100.2 F, oxygen saturation 73% on room air but has gotten to 93% with 3-4 L [place] oxygen support. Blood work showed leukocytosis of 22.7, hemoglobin 11.5, platelets 148, Cr 1.86, troponin 0.1, lactic acid 2.7. CXR showed volume loss and basilar opacity in the left hemithorax suspicious for infiltrate. He has just started receiving 1 L fluids bolus and is receiving Levaquin. TR[facility] asked to admit for sepsis, pneumonia management.   Review of Systems:  Constitutional: positive for fever, no chills, diaphoresis, appetite change HENT: Negative for ear pain, nosebleeds, congestion, facial swelling, rhinorrhea, neck pain, neck stiffness and ear discharge.   Eyes: Negative for pain, discharge, redness, itching and visual disturbance.  Respiratory: Negative for choking, chest tightness, positive for shortness of breath     Cardiovascular: Negative for chest pain, palpitations and leg swelling.  Gastrointestinal: Negative for abdominal distention.  Genitourinary: Negative for dysuria, urgency, frequency, hematuria, flank pain, decreased urine volume, difficulty urinating and dyspareunia.  Musculoskeletal: positive for weakness, fatigue, no joint pain  Neurological: Negative for dizziness, tremors, seizures, syncope, facial asymmetry, speech difficulty, light-headedness, numbness and headaches.  Hematological: Negative for adenopathy. Does not bruise/bleed easily.  Psychiatric/Behavioral: Negative for hallucinations, behavioral problems, confusion, dysphoric mood, decreased concentration and agitation.   Past Medical History:  Diagnosis Date  . Anginal pain ([place])    within the last 2 wks has taken Ntg 1 time  . Arthritis   . Cardiomyopathy    broken 3x's  . CHF (congestive heart failure) (HCC)    takes Furosemide daily  . Chronic back pain   . Confusion    short term memory loss  . Diabetes mellitus    type 2;takes Lantus nightly and Humalog prn  . Diverticulosis   . Dysrhythmia    HX OF COMPLETE HEART BLOCK;takes Metoprolol daily  . Enlarged prostate    takes [employer]  . Glaucoma    uses several drops  . H/O hiatal hernia   . History of colon polyps   . History of fracture of nose   . History of shingles   . History of staph infection    50+yrs ago  . Hyperlipidemia    takes Simvastatin daily  . Hypertension    takes Losartan daily  . Joint pain   . Nocturia   . Pacemaker    [employer]  .  PONV (postoperative nausea and vomiting)   . Seasonal allergies    uses Flonase prn and takes Claritin prn  . Shortness of breath    lying and with exertion  . Skin spots, red     Past Surgical History:  Procedure Laterality Date  . BACK SURGERY     x 2  . bilateral cataract surgery    . COLONOSCOPY    . EPICARDIAL PACING LEAD PLACEMENT N/A 10/24/2012   Procedure: EPICARDIAL PACING LEAD  PLACEMENT;  Surgeon: Gaye Pollack, MD;  Location: [facility] OR;  Service: Thoracic;  Laterality: N/A;  LV EPICARDIAL LEADS  . ESOPHAGOGASTRODUODENOSCOPY    . INSERT / REPLACE / REMOVE PACEMAKER     x 3  . JOINT REPLACEMENT     right knee arthroplasty  . LEAD REVISION N/A 09/30/2012   Procedure: LEAD REVISION;  Surgeon: Evans Lance, MD;  Location[facility] C[facility] LAB;  Service: Cardiovascular;  Laterality: N/A;  . PACEMAKER LEAD REMOVAL N/A 08/21/2012   Procedure: PACEMAKER LEAD REMOVAL;  Surgeon: Evans Lance, MD;  Location: [place];  Service: Cardiovascular;  Laterality: N/A;  . right eye lasik    . THORACOTOMY Left 10/24/2012   Procedure: THORACOTOMY MAJOR;  Surgeon: Gaye Pollack, MD;  Location: [facility] OR;  Service: Thoracic;  Laterality: Left;  . TRANSTHORACIC ECHOCARDIOGRAM  01/2007    Social history:  reports that he quit smoking about 47 years ago. He has never used smokeless tobacco. He reports that he does not drink alcohol or use drugs.  Ambulation: At baseline, pt walks with the cane or walker and can do that without the assistance   Allergies  Allergen Reactions  . Clindamycin/Lincomycin Nausea And Vomiting    Family History  Problem Relation Age of Onset  . Heart disease Mother     Prior to Admission medications   Medication Sig Start Date End Date Taking? Authorizing Provider  B Complex Vitamins (VITAMIN-B COMPLEX PO) Take 1 tablet by mouth daily.    Yes Historical Provider, MD  brimonidine-timolol (COMBIGAN) 0.2-0.5 % ophthalmic solution Place 1 drop into the right eye every 12 (twelve) hours.   Yes Historical Provider, MD  dorzolamide (TRUSOPT) 2 % ophthalmic solution Place 1 drop into the right eye 2 (two) times daily.  05/04/12  Yes Historical Provider, MD  fluPHENAZine (PROLIXIN) 2.5 MG tablet Take 2.5 mg by mouth 2 (two) times daily.  11/29/15  Yes Historical Provider, MD  fluPHENAZine (PROLIXIN) 5 MG tablet Take 2.5 mg by mouth 2 (two) times daily. 07/14/15  Yes Historical  Provider, MD  fluticasone (FLONASE) 50 MCG/ACT nasal spray Place 2 sprays into the nose daily.  05/29/12  Yes Historical Provider, MD  furosemide (LASIX) 40 MG tablet Take 40-120 mg by mouth 3 (three) times daily. Take 3 tablets in the am, Take 2 tablets in the pm & Take 1 tablet qhs   Yes Historical Provider, MD  insulin lispro (HUMALOG) 100 UNIT/ML injection Inject 15-20 Units into the skin daily as needed for high blood sugar.    Yes Historical Provider, MD  latanoprost (XALATAN) 0.005 % ophthalmic solution Place 1 drop into the left eye at bedtime.  08/04/15  Yes Historical Provider, MD  levothyroxine (SYNTHROID, LEVOTHROID) 25 MCG tablet Take 25 mcg by mouth daily before breakfast.  10/11/15  Yes Historical Provider, MD  losartan (COZAAR) 25 MG tablet TAKE 1 TABLET BY MOUTH DAILY 07/28/13  Yes Jettie Booze, MD  metoprolol (LOPRESSOR) 50 MG tablet Take 50  mg by mouth 2 (two) times daily.  12/02/15  Yes Historical Provider, MD  Misc Natural Products (OSTEO BI-FLEX TRIPLE STRENGTH PO) Take 2 tablets by mouth daily.    Yes Historical Provider, MD  Multiple Vitamins-Minerals (MULTIVITAMIN WITH MINERALS) tablet Take 1 tablet by mouth daily.    Yes Historical Provider, MD  Omega-3 1400 MG CAPS Take 2 capsules by mouth daily.   Yes Historical Provider, MD  Polyethyl Glycol-Propyl Glycol (SYSTANE ULTRA OP) Place 1 drop into the left eye every morning.    Yes Historical Provider, MD  potassium gluconate 595 MG TABS Take 595 mg by mouth daily.   Yes Historical Provider, MD  Saw [place] 450 MG CAPS Take 450 mg by mouth 2 (two) times daily.   Yes Historical Provider, MD  simvastatin (ZOCOR) 40 MG tablet Take 40 mg by mouth every evening.    Yes Historical Provider, MD  spironolactone (ALDACTONE) 25 MG tablet Take 25 mg by mouth daily. 1 IN A.M. 07/21/15  Yes Historical Provider, MD  TOUJEO SOLOSTAR 300 UNIT/ML SOPN Inject 30 Units into the skin at bedtime.  04/29/14  Yes Historical Provider, MD  travoprost,  benzalkonium, (TRAVATAN) 0.004 % ophthalmic solution Place 1 drop into the right eye at bedtime.    Yes Historical Provider, MD  ACCU-CHEK AVIVA PLUS test strip 1 each by Other route as needed.  07/21/15   Historical Provider, MD  ACCU-CHEK FASTCLIX LANCETS MISC Apply 1 each topically as directed.  07/21/15   Historical Provider, MD  acetaminophen (TYLENOL) 650 MG CR tablet Take 650 mg by mouth every 8 (eight) hours as needed for pain.    Historical Provider, MD  gabapentin (NEURONTIN) 100 MG capsule Take 100-200 mg by mouth 3 (three) times daily.    Historical Provider, MD  loratadine (CLARITIN) 10 MG tablet Take 10 mg by mouth daily as needed for allergies.     Historical Provider, MD  NITROSTAT 0.4 MG SL tablet TAKE 1 TABLET UNDER THE TONGUE AS NEEDEDFOR CHEST PAIN (MAY REPEAT EVERY 5 MIN X3 DOSES, CALL 911 IF NO RELIEF.) 07/16/15   Jettie Booze, MD  traMADol (ULTRAM) 50 MG tablet Take 50 mg by mouth every 6 (six) hours as needed for moderate pain or severe pain.  12/01/15   Historical Provider, MD    Physical Exam: Vitals:   11/19/2015 1720 11/20/2015 1732 11/27/2015 1738 12/18/2015 1759  BP: 95/67     Pulse: 101 99    Resp: (!) 29 26    Temp:   99.4 F (37.4 C) 98.2 F (36.8 C)  TempSrc:   Rectal Oral  SpO2: 92% 93%    Weight:    89.1 kg (196 lb 6.9 oz)  Height:    _0  (1.803 m)     Vitals:   12/12/2015 1720 12/14/2015 1732 11/20/2015 1738 12/11/2015 1759  BP: 95/67     Pulse: 101 99    Resp: (!) 29 26    Temp:   99.4 F (37.4 C) 98.2 F (36.8 C)  TempSrc:   Rectal Oral  SpO2: 92% 93%    Weight:    89.1 kg (196 lb 6.9 oz)  Height:    _1  (1.803 m)    Constitutional: NAD, calm, comfortable Eyes: PERRL, lids and conjunctivae normal; pale face  ENMT: Mucous membranes are moist. Posterior pharynx clear of any exudate or lesions. Neck: normal, supple, no masses, no thyromegaly Respiratory: diminished, coarse breath sounds, rales on left side  Cardiovascular: tachycardic, +  2 LE  pitting edema, irregular rhythm  Abdomen: no tenderness, no masses palpated. No hepatosplenomegaly. Bowel sounds positive.  Musculoskeletal: no clubbing / cyanosis. No joint deformity upper and lower extremities. Good ROM, no contractures. Normal muscle tone.  Skin: open wound on LE bilaterally with weeping Neurologic: CN 2-12 grossly intact. Sensation intact, DTR normal. Strength 5/5 in all 4.  Psychiatric: Normal judgment and insight. Alert and oriented x 3. Normal mood.    Labs on Admission: I have personally reviewed following labs and imaging studies  CBC:  Recent Labs Lab 11/26/2015 1604  WBC 22.7*  NEUTROABS 18.8*  HGB 11.5*  HCT 35.2*  MCV 99.7  PLT 427*   Basic Metabolic Panel:  Recent Labs Lab 11/30/2015 1604  NA 135  K 4.8  CL 97*  CO2 28  GLUCOSE 150*  BUN 52*  CREATININE 1.86*  CALCIUM 9.6   GFR: Estimated Creatinine Clearance: 32.6 mL/min (by C-G formula based on SCr of 1.86 mg/dL (H)). Liver Function Tests: No results for input(s): AST, ALT, ALKPHOS, BILITOT, PROT, ALBUMIN in the last 168 hours. No results for input(s): LIPASE, AMYLASE in the last 168 hours. No results for input(s): AMMONIA in the last 168 hours. Coagulation Profile: No results for input(s): INR, PROTIME in the last 168 hours. Cardiac Enzymes:  Recent Labs Lab 12/09/2015 1617  TROPONINI 0.18*   BNP (last 3 results) No results for input(s): PROBNP in the last 8760 hours. HbA1C: No results for input(s): HGBA1C in the last 72 hours. CBG:  Recent Labs Lab 11/20/2015 1608  GLUCAP 143*   Lipid Profile: No results for input(s): CHOL, HDL, LDLCALC, TRIG, CHOLHDL, LDLDIRECT in the last 72 hours. Thyroid Function Tests: No results for input(s): TSH, T4TOTAL, FREET4, T3FREE, THYROIDAB in the last 72 hours. Anemia Panel: No results for input(s): VITAMINB12, FOLATE, FERRITIN, TIBC, IRON, RETICCTPCT in the last 72 hours. Urine analysis:    Component Value Date/Time   COLORURINE YELLOW  10/23/2012 [place] 10/23/2012 0955   LABSPEC 1.013 10/23/2012 0955   PHURINE 7.0 10/23/2012 0955   GLUCOSEU NEGATIVE 10/23/2012 0955   HGBUR NEGATIVE 10/23/2012 0955   BILIRUBINUR NEGATIVE 10/23/2012 0955   KETONESUR NEGATIVE 10/23/2012 0955   PROTEINUR NEGATIVE 10/23/2012 0955   UROBILINOGEN 1.0 10/23/2012 0955   NITRITE NEGATIVE 10/23/2012 0955   LEUKOCYTESUR NEGATIVE 10/23/2012 0955   Sepsis Labs: _0 (procalcitonin:4,lacticidven:4) )No results found for this or any previous visit (from the past 240 hour(s)).   Radiological Exams on Admission: Dg Chest Port 1 View Result Date: 12/07/2015 1. Progressive volume loss and basilar opacity in the left hemithorax suspicious for atelectasis or infiltrate. Probable adjacent small left pleural effusion. Followup PA and lateral chest X-ray is recommended in 3-4 weeks (following trial of antibiotic therapy if clinically warranted) to ensure resolution and exclude underlying malignancy. 2. Cardiomegaly with chronic vascular congestion. Electronically Signed   By: Richardean Sale M.D.   On: 11/26/2015 16:57    EKG: Independently reviewed. Ventricular paced rhythm   Assessment/Plan   Principal Problem:   Acute respiratory failure with hypoxia (HCC)  - Oxygen saturation was as low as 73% on room air but has gotten up to 93% with [place] oxygen support - Hypoxia likely due to pneumonia versus acute decompensated CHF - Started xopenex and Atrovent every 2 hours as needed - Started empiric treatment for pneumonia - Monitor in SDU  Active Problems:   Sepsis due to pneumonia ([place]) / Lobar pneumonia, unspecified organism ([place]) / Leukocytosis  -  Sepsis criteria met on admission with fever, tachycardia, tachypnea, hypoxia, hypotension, leukocytosis, lactic acidosis and CXR with evidence of left lung pneumonia - Sepsis order set utilized - Started vanco and cefepime  - Follow up blood culture results     Acute on chronic  systolic and diastolic CHF (congestive heart failure) ([place]) / CARDIAC PACEMAKER-[employer] - Due to hypotension we are limited in regards to giving lasix - Monitor volume status - Has received 2 L IV fluids and we will not give any more unless BP drops to less than 80/50; I spoke with CCM who was okay with 3 L of IVF if needed and if that does not help then pt will need pressors     Essential hypertension, benign - BP meds on hold due to low BP    CKD (chronic kidney disease) stage 3, GFR 30-59 ml/min - Cr at baseline 1.9 in 05/2015 - Cr within baseline range     Anemia of chronic kidney disease - Stable hemoglobin     Controlled diabetes mellitus with diabetic nephropathy, with long-term current use of insulin (HCC) - Resume insulin half the home dose until PO intake better    Dyslipidemia associated with type 2 diabetes mellitus (HCC) - Continue statin therapy    Elevated troponin - Due to demand ischemia from sepsis, acute decompensated CHF - No chest pain  - Cycle troponin levels  - Obtain 2 D ECHO    DVT prophylaxis: SCD's bilaterally  Code Status: full code Family Communication: son at bedside  Disposition Plan: admission to [place] called: CCM Admission status: Inpatient. Pt is extremely sick and needs admission to SDU and potential change to ICU level care. He has sepsis (criteria met with fever, tachycardia, tachypnea, hypoxia, hypotension, lactic acid and leukocytosis). CXR with possible left side pneumonia. He also has significant LE edema and hypotension which may limit the total IV fluids he can get per sepsis protocol. He may require pressor support. He also requires broad spectrum antibiotics for treatment of pneumonia.    Leisa Lenz MD [facility] Pager [phone]  If 7PM-7AM, please contact night-coverage [email or web address] Password [email or web address]  11/28/2015, 6:30 PM

## 2015-12-13 NOTE — ED Triage Notes (Signed)
Patient's family member states patient's bilateral lower extremities started to become weak last night at 9pm. States patient unable to sit up in bed. Neuro assessment unremarkable. Patient has a pacemaker. Alert, oriented x3 (stated today is Sunday). Denies pain. Denies recent sickness. Patient normally on 3 L [place] at home. Patient is 73% on 3 L [place].

## 2015-12-13 NOTE — ED Notes (Signed)
Hospitalist at bedside. Hospitalist made aware of patient's troponin

## 2015-12-13 NOTE — ED Notes (Signed)
Both sets of blood cultures have been drawn [id number] and LAC [id number].

## 2015-12-13 NOTE — ED Notes (Signed)
X-ray at bedside

## 2015-12-14 ENCOUNTER — Inpatient Hospital Stay (HOSPITAL_COMMUNITY): Payer: Medicare Other

## 2015-12-14 DIAGNOSIS — I5043 Acute on chronic combined systolic (congestive) and diastolic (congestive) heart failure: Secondary | ICD-10-CM

## 2015-12-14 DIAGNOSIS — R7989 Other specified abnormal findings of blood chemistry: Secondary | ICD-10-CM

## 2015-12-14 DIAGNOSIS — J9601 Acute respiratory failure with hypoxia: Secondary | ICD-10-CM

## 2015-12-14 DIAGNOSIS — E1121 Type 2 diabetes mellitus with diabetic nephropathy: Secondary | ICD-10-CM

## 2015-12-14 DIAGNOSIS — I509 Heart failure, unspecified: Secondary | ICD-10-CM

## 2015-12-14 LAB — CBC
HEMATOCRIT: 34 % — AB (ref 39.0–52.0)
Hemoglobin: 10.9 g/dL — ABNORMAL LOW (ref 13.0–17.0)
MCH: 32.2 pg (ref 26.0–34.0)
MCHC: 32.1 g/dL (ref 30.0–36.0)
MCV: 100.3 fL — ABNORMAL HIGH (ref 78.0–100.0)
Platelets: 132 10*3/uL — ABNORMAL LOW (ref 150–400)
RBC: 3.39 MIL/uL — ABNORMAL LOW (ref 4.22–5.81)
RDW: 15.4 % (ref 11.5–15.5)
WBC: 17.5 10*3/uL — ABNORMAL HIGH (ref 4.0–10.5)

## 2015-12-14 LAB — BASIC METABOLIC PANEL
Anion gap: 8 (ref 5–15)
BUN: 54 mg/dL — AB (ref 6–20)
CHLORIDE: 97 mmol/L — AB (ref 101–111)
CO2: 28 mmol/L (ref 22–32)
CREATININE: 1.81 mg/dL — AB (ref 0.61–1.24)
Calcium: 9.2 mg/dL (ref 8.9–10.3)
GFR calc Af Amer: 38 mL/min — ABNORMAL LOW (ref >60)
GFR, EST NON AFRICAN AMERICAN: 33 mL/min — AB (ref >60)
GLUCOSE: 136 mg/dL — AB (ref 65–99)
POTASSIUM: 4.4 mmol/L (ref 3.5–5.1)
Sodium: 133 mmol/L — ABNORMAL LOW (ref 135–145)

## 2015-12-14 LAB — GLUCOSE, CAPILLARY
GLUCOSE-CAPILLARY: 146 mg/dL — AB (ref 65–99)
GLUCOSE-CAPILLARY: 123 mg/dL — AB (ref 65–99)

## 2015-12-14 LAB — TROPONIN I
Troponin I: 0.18 ng/mL (ref <0.03)
Troponin I: 0.16 ng/mL (ref <0.03)

## 2015-12-14 LAB — ECHOCARDIOGRAM COMPLETE
Height: 71 in
Weight: 3142.88 oz

## 2015-12-14 MED ORDER — FLUCONAZOLE IN SODIUM CHLORIDE 100-0.9 MG/50ML-% IV SOLN
100.0000 mg | Freq: Once | INTRAVENOUS | Status: AC
  Administered 2015-12-14: 100 mg via INTRAVENOUS
  Filled 2015-12-14: qty 50

## 2015-12-14 MED ORDER — PERFLUTREN LIPID MICROSPHERE
1.0000 mL | INTRAVENOUS | Status: AC | PRN
  Administered 2015-12-14: 1 mL via INTRAVENOUS
  Filled 2015-12-14: qty 10

## 2015-12-14 NOTE — Consult Note (Signed)
Patient ID: Louis Acosta MRN: [id number], DOB/AGE: 17-Feb-1932   Admit date: 12/11/2015   Reason for Consult: Elevated Troponin Requesting MD: Dr. Charlies Silvers, Internal Medicine    Primary Physician: [name], MD Primary Cardiologist: Dr. Irish Lack Electrophysiologist: Dr. Lovena Le  Pt. Profile:  82 y/o male with h/o CAD, s/p remote LAD stenting, NICM/ chronic systolic HF with prior EF of 15%, h/o CHB s/p biventricular PPM insertion, IDDM, HTN, and HLD admitted for sepsis in the setting of presumed PNA. Cardiology consulted for elevated troponins x 3.   Problem List  Past Medical History:  Diagnosis Date  . Anginal pain ([place])    within the last 2 wks has taken Ntg 1 time  . Arthritis   . Cardiomyopathy    broken 3x's  . CHF (congestive heart failure) (HCC)    takes Furosemide daily  . Chronic back pain   . Confusion    short term memory loss  . Diabetes mellitus    type 2;takes Lantus nightly and Humalog prn  . Diverticulosis   . Dysrhythmia    HX OF COMPLETE HEART BLOCK;takes Metoprolol daily  . Enlarged prostate    takes [employer]  . Glaucoma    uses several drops  . H/O hiatal hernia   . History of colon polyps   . History of fracture of nose   . History of shingles   . History of staph infection    50+yrs ago  . Hyperlipidemia    takes Simvastatin daily  . Hypertension    takes Losartan daily  . Joint pain   . Nocturia   . Pacemaker    St Jude  . PONV (postoperative nausea and vomiting)   . Seasonal allergies    uses Flonase prn and takes Claritin prn  . Shortness of breath    lying and with exertion  . Skin spots, red     Past Surgical History:  Procedure Laterality Date  . BACK SURGERY     x 2  . bilateral cataract surgery    . COLONOSCOPY    . EPICARDIAL PACING LEAD PLACEMENT N/A 10/24/2012   Procedure: EPICARDIAL PACING LEAD PLACEMENT;  Surgeon: Gaye Pollack, MD;  Location: [facility] OR;  Service: Thoracic;  Laterality: N/A;  LV  EPICARDIAL LEADS  . ESOPHAGOGASTRODUODENOSCOPY    . INSERT / REPLACE / REMOVE PACEMAKER     x 3  . JOINT REPLACEMENT     right knee arthroplasty  . LEAD REVISION N/A 09/30/2012   Procedure: LEAD REVISION;  Surgeon: Evans Lance, MD;  Location: [facility] CATH LAB;  Service: Cardiovascular;  Laterality: N/A;  . PACEMAKER LEAD REMOVAL N/A 08/21/2012   Procedure: PACEMAKER LEAD REMOVAL;  Surgeon: Evans Lance, MD;  Location: [place];  Service: Cardiovascular;  Laterality: N/A;  . right eye lasik    . THORACOTOMY Left 10/24/2012   Procedure: THORACOTOMY MAJOR;  Surgeon: Gaye Pollack, MD;  Location: [facility] OR;  Service: Thoracic;  Laterality: Left;  . TRANSTHORACIC ECHOCARDIOGRAM  01/2007     Allergies  Allergies  Allergen Reactions  . Clindamycin/Lincomycin Nausea And Vomiting    HPI  82 y/o male with h/o CAD s/p anterior MI with BMS to the LAD in 2007. His last LHC in 2012 showed patent LAD stent. He also has a h/o chronic systolic HF/ NICM and CHB, s/p Bibiventricular pacemaker insertion, followed by Dr. Lovena Le. His last echo in 2014 showed low EF at 15% with akinesis of the inferior  wall and severe hypokinesis of anterior, lateral and septal walls. Additional medical problems include HTN, HLD, IDDM and stage 3 CKD. Last seen by Dr. Lovena Le in March of this year. His device was working normally with stable pacing thresholds in all chambers. It was noted that he was less than 1 year from ERI. He was last seen by Dr. Irish Lack in May and was noted to be stable. He was advised to f/u in 1 year.   He presented [facility] ED on 11/20/2015 with a complaint of weakness and SOB. In his own words, patient notes that he has had some SSCP off and on for the past week. Similar to his angina prior to undergoing LAD PCI. Relieved with nitro. Also with dyspnea and cough. He notes his main reason for coming to the hospital was generalized weakness, unable to walk around his house w/o his legs tiring out. He does not recall  any exertional chest pain with ambulation.   In ED, BP is 73/58, HR 108, RR 21-29, T max 100.2 F, oxygen saturation 73% on room air - increased to 93% on 3-4L [place]. Labs showed leukocytosis of 22.7, hemoglobin 11.5, platelets 148, Cr 1.86, troponin 0.1, lactic acid 2.7. CXR showed volume loss and basilar opacity in the left hemithorax suspicious for infiltrate. He was admitted by IM for sepsis, in the setting of likely PNA, and placed on IVFs ans antibiotic therapy with IV levaquin.    Cardiology has been consulted for elevated troponins x 3, at 0.20>>0.18>>0.16. His SCr/BUN is at 1.81/54. He is CP free. Remains on supplemental O2.   Home Medications  Prior to Admission medications   Medication Sig Start Date End Date Taking? Authorizing Provider  B Complex Vitamins (VITAMIN-B COMPLEX PO) Take 1 tablet by mouth daily.    Yes Historical Provider, MD  dorzolamide (TRUSOPT) 2 % ophthalmic solution Place 1 drop into the left eye 3 (three) times daily.  05/04/12  Yes Historical Provider, MD  fluPHENAZine (PROLIXIN) 2.5 MG tablet Take 2.5 mg by mouth 2 (two) times daily.  11/29/15  Yes Historical Provider, MD  fluPHENAZine (PROLIXIN) 5 MG tablet Take 2.5 mg by mouth 2 (two) times daily. 07/14/15  Yes Historical Provider, MD  fluticasone (FLONASE) 50 MCG/ACT nasal spray Place 2 sprays into the nose daily.  05/29/12  Yes Historical Provider, MD  furosemide (LASIX) 40 MG tablet Take 40-120 mg by mouth 3 (three) times daily. Take 3 tablets in the am, Take 2 tablets in the pm & Take 1 tablet qhs   Yes Historical Provider, MD  insulin lispro (HUMALOG) 100 UNIT/ML injection Inject 15-20 Units into the skin daily as needed for high blood sugar.    Yes Historical Provider, MD  latanoprost (XALATAN) 0.005 % ophthalmic solution Place 1 drop into the left eye at bedtime.  08/04/15  Yes Historical Provider, MD  levothyroxine (SYNTHROID, LEVOTHROID) 25 MCG tablet Take 25 mcg by mouth daily before breakfast.  10/11/15  Yes  Historical Provider, MD  losartan (COZAAR) 25 MG tablet TAKE 1 TABLET BY MOUTH DAILY 07/28/13  Yes Jettie Booze, MD  metoprolol (LOPRESSOR) 50 MG tablet Take 50 mg by mouth 2 (two) times daily.  12/02/15  Yes Historical Provider, MD  Misc Natural Products (OSTEO BI-FLEX TRIPLE STRENGTH PO) Take 2 tablets by mouth daily.    Yes Historical Provider, MD  Multiple Vitamins-Minerals (MULTIVITAMIN WITH MINERALS) tablet Take 1 tablet by mouth daily.    Yes Historical Provider, MD  Omega-3 1400  MG CAPS Take 2 capsules by mouth daily.   Yes Historical Provider, MD  Polyethyl Glycol-Propyl Glycol (SYSTANE ULTRA OP) Place 1 drop into the left eye every morning.    Yes Historical Provider, MD  potassium gluconate 595 MG TABS Take 595 mg by mouth daily.   Yes Historical Provider, MD  prednisoLONE acetate (PRED FORTE) 1 % ophthalmic suspension Place 1 drop into the right eye at bedtime.   Yes Historical Provider, MD  Saw [place] 450 MG CAPS Take 450 mg by mouth 2 (two) times daily.   Yes Historical Provider, MD  simvastatin (ZOCOR) 40 MG tablet Take 40 mg by mouth every evening.    Yes Historical Provider, MD  spironolactone (ALDACTONE) 25 MG tablet Take 25 mg by mouth daily. 1 IN A.M. 07/21/15  Yes Historical Provider, MD  TOUJEO SOLOSTAR 300 UNIT/ML SOPN Inject 30 Units into the skin at bedtime.  04/29/14  Yes Historical Provider, MD  ACCU-CHEK AVIVA PLUS test strip 1 each by Other route as needed.  07/21/15   Historical Provider, MD  ACCU-CHEK FASTCLIX LANCETS MISC Apply 1 each topically as directed.  07/21/15   Historical Provider, MD  acetaminophen (TYLENOL) 650 MG CR tablet Take 650 mg by mouth every 8 (eight) hours as needed for pain.    Historical Provider, MD  gabapentin (NEURONTIN) 100 MG capsule Take 100-200 mg by mouth 3 (three) times daily.    Historical Provider, MD  loratadine (CLARITIN) 10 MG tablet Take 10 mg by mouth daily as needed for allergies.     Historical Provider, MD  NITROSTAT 0.4 MG SL  tablet TAKE 1 TABLET UNDER THE TONGUE AS NEEDEDFOR CHEST PAIN (MAY REPEAT EVERY 5 MIN X3 DOSES, CALL 911 IF NO RELIEF.) 07/16/15   Jettie Booze, MD  traMADol (ULTRAM) 50 MG tablet Take 50 mg by mouth every 6 (six) hours as needed for moderate pain or severe pain.  12/01/15   Historical Provider, MD   Hospital Meds  . ceFEPime (MAXIPIME) IV  1 g Intravenous Q24H  . dorzolamide  1 drop Left Eye TID  . fluconazole (DIFLUCAN) IV  100 mg Intravenous Once  . fluPHENAZine  2.5 mg Oral BID  . fluticasone  2 spray Each Nare Daily  . gabapentin  100-200 mg Oral TID  . insulin glargine  15 Units Subcutaneous QHS  . latanoprost  1 drop Left Eye QHS  . levothyroxine  25 mcg Oral QAC breakfast  . multivitamin with minerals  1 tablet Oral Daily  . omega-3 acid ethyl esters  2 g Oral Daily  . polyvinyl alcohol  1 drop Left Eye Daily  . prednisoLONE acetate  1 drop Right Eye QHS  . simvastatin  40 mg Oral QPM  . sodium chloride  1,000 mL Intravenous Once   And  . sodium chloride  1,000 mL Intravenous Once   Family History  Family History  Problem Relation Age of Onset  . Heart disease Mother     Social History  Social History   Social History  . Marital status: Married    Spouse name: N/A  . Number of children: N/A  . Years of education: N/A   Occupational History  . retired    Social History Main Topics  . Smoking status: Former Smoker    Quit date: 03/20/1968  . Smokeless tobacco: Never Used  . Alcohol use No  . Drug use: No  . Sexual activity: No   Other Topics Concern  . Not  on file   Social History Narrative  . No narrative on file     Review of Systems General:  No chills, fever, night sweats or weight changes.  Cardiovascular:  +chest pain, dyspnea on exertion, edema, orthopnea, palpitations, paroxysmal nocturnal dyspnea. Dermatological: No rash, lesions/masses Respiratory: + cough, + dyspnea Urologic: No hematuria, dysuria Abdominal:   No nausea, vomiting,  diarrhea, bright red blood per rectum, melena, or hematemesis Neurologic:  No visual changes, wkns, changes in mental status. All other systems reviewed and are otherwise negative except as noted above.  Physical Exam  Blood pressure (!) 101/51, pulse 97, temperature 98.1 F (36.7 C), temperature source Oral, resp. rate 19, height 5\' 11"  (1.803 m), weight 196 lb 6.9 oz (89.1 kg), SpO2 90 %.  General: Pleasant, NAD, elderly  Psych: Normal affect. Neuro: Alert and oriented X 3. Moves all extremities spontaneously. HEENT: Normal  Neck: Supple without bruits or JVD. Lungs:  Resp regular and unlabored, decreased BS bilaterally. Crackles in LLL Heart: RRR no s3, s4, or murmurs. Abdomen: Soft, non-tender, non-distended, BS + x 4.  Extremities: No clubbing. Trace - 1+ bilateral LEE.  DP/PT/Radials 2+ and equal bilaterally.  Labs  Troponin (Point of Care Test) No results for input(s): TROPIPOC in the last 72 hours.  Recent Labs  12/16/2015 1617 12/10/2015 1946 12/14/15 0111 12/14/15 0801  TROPONINI 0.18* 0.20* 0.18* 0.16*   Lab Results  Component Value Date   WBC 17.5 (H) 12/14/2015   HGB 10.9 (L) 12/14/2015   HCT 34.0 (L) 12/14/2015   MCV 100.3 (H) 12/14/2015   PLT 132 (L) 12/14/2015     Recent Labs Lab 12/14/15 0111  NA 133*  K 4.4  CL 97*  CO2 28  BUN 54*  CREATININE 1.81*  CALCIUM 9.2  GLUCOSE 136*   No results found for: CHOL, HDL, LDLCALC, TRIG No results found for: DDIMER   Radiology/Studies  Dg Chest Port 1 View  Result Date: 12/15/2015 CLINICAL DATA:  Chronic shortness of breath. On home oxygen. History of hypertension and diabetes. EXAM: PORTABLE CHEST 1 VIEW COMPARISON:  12/23/2012. FINDINGS: 1633 hours. Mild patient rotation to the left. Left subclavian AICD leads appear grossly unchanged. Old leads are present. The heart is enlarged. There is aortic atherosclerosis. There is progressive volume loss and opacity inferiorly in the left hemithorax with a  probable adjacent small left pleural effusion. There is mild overall vascular congestion without focal opacity in the right lung. The bones appear unchanged. IMPRESSION: 1. Progressive volume loss and basilar opacity in the left hemithorax suspicious for atelectasis or infiltrate. Probable adjacent small left pleural effusion. Followup PA and lateral chest X-ray is recommended in 3-4 weeks (following trial of antibiotic therapy if clinically warranted) to ensure resolution and exclude underlying malignancy. 2. Cardiomegaly with chronic vascular congestion. Electronically Signed   By: Richardean Sale M.D.   On: 11/19/2015 16:57    ECG  V-paced complexes.   Echocardiogram - pending    ASSESSMENT AND PLAN  Principal Problem:   Acute respiratory failure with hypoxia (HCC) Active Problems:   Essential hypertension, benign   CARDIAC PACEMAKER-[employer]ude   Lobar pneumonia, unspecified organism (HCC)   CKD (chronic kidney disease) stage 3, GFR 30-59 ml/min   Controlled diabetes mellitus with diabetic nephropathy, with long-term current use of insulin (HCC)   Dyslipidemia associated with type 2 diabetes mellitus (HCC)   Elevated troponin   Leukocytosis   Sepsis due to pneumonia ([place])   Acute on chronic combined systolic and diastolic  CHF (congestive heart failure) ([place])    1. Acute Respiratory Failure with Hypoxia: in the setting of sepsis/ PNA. Currently on IV antibiotics. IM managing.   2. Elevated Troponin: cardiac enzymes abnormal x 3 at 0.20>> 0.18>>0.16. This is in the setting of sepsis/ PNA, as well as CKD with SCR/BUN at 1.8./54. However patient has also endorsed recent SSCP relieved with SL NTG. He has a known h/o CAD s/p remote LAD stenting in the past. Last LHC was in 2012 with patent stent. EF low at 15% on prior echo. Once he recovers from his sepsis, can consider noninvasive NST to assess for underlying ischemia, prior to committing patient to cath. Although, given age and CKD,  medical therapy only may be better option. Luckily, he is CP free and troponin level is down trending. Continue to monitor. Continue statin. His BB is on hold given hypotension from sepsis. Currently not on ASA, platelets trending down from 148 K>>132>>.   3. CAD: h/o prior MI s/p BMS to the LAD in 2007. His last LHC in 2012 showed patent LAD stent. Recent CP with elevated troponin levels. Plan outlined above.   4. NICM: EF as low as 15% in the past. S/p biventricular pacemaker, followed by Dr. Lovena Le. HF meds including ARB, BB and spironolactone all on hold given hypotension from sepsis. Repeat 2D echo pending.   5. Stage 3 CKD: SCr 1.8 on admit. Prior SCr for comparison is from 07/2014, when SCr was 1.11.   6. H/o CHB: has PPM followed by Dr. Lovena Le.    Signed, Lyda Jester, PA-C 12/14/2015, 10:10 AM   Attending Note:   The patient was seen and examined.  Agree with assessment and plan as noted above.  Changes made to the above note as needed.  Patient seen and independently examined with Lyda Jester, PA .   We discussed all aspects of the encounter. I agree with the assessment and plan as stated above.  1. Pneumonia / pleural effusion - with subsequent respiratory failure. Has improved.  By hx he describes pleuritic CP - not necessarily angina .   2. CAD - his CP sounds more pleuritic - not necessariily angina. Would continue to treat this pneumonia and then consider a Lexiscan myoview if he has CP after the pneumonia is resolved.   3. Chronic systolic chf:   Continue current meds.  Restart  CHF meds as BP allows.    BP is still marginal today      I have spent a total of 40 minutes with patient reviewing hospital  notes , telemetry, EKGs, labs and examining patient as well as establishing an assessment and plan that was discussed with the patient. > 50% of time was spent in direct patient care.    Thayer Headings, Brooke Bonito., MD, [facility] 12/14/2015, 11:30 AM [address],  [place] Pager [phone]

## 2015-12-14 NOTE — Progress Notes (Addendum)
Patient ID: Louis Acosta, male   DOB: 01/19/1932, 82 y.o.   MRN: [id number]  PROGRESS NOTE    Louis Acosta  WUJ:[id number] DOB: 06/14/1931 DOA: 11/25/2015  PCP: [name], MD   Brief Narrative:   82 y.o. male with medical history significant for hypertension, chronic combined CHF, has pacemaker, DM on insulin, dyslipidemia, CKD stage 3 with baseline Cr 1.9 in 05/2015. He comes from home and his son at the bedside provided most of the history . Pt was in his usual state of health, at baseline able to walk with the walker and cane but over past day PTA he was not able to get up without the assistance. He could not ambulate without the assistance and he almost fell. He was also more short of breath especially when trying to get up. No chest pain, no palpitations.  In ED, BP is 73/58, HR 108, RR 21-29, T max 100.2 F, oxygen saturation 73% on room air but has gotten to 93% with 3-4 L [place] oxygen support. Blood work showed leukocytosis of 22.7, hemoglobin 11.5, platelets 148, Cr 1.86, troponin 0.1, lactic acid 2.7. CXR showed volume loss and basilar opacity in the left hemithorax suspicious for infiltrate. He has received 2 L fluids on admission.    Assessment & Plan:   Principal Problem:   Acute respiratory failure with hypoxia (HCC)  - Oxygen saturation was as low as 73% on room air but has gotten up to 93% with [place] oxygen support - Hypoxia likely due to pneumonia versus acute decompensated CHF - Continue xopenex and Atrovent every 2 hours as needed - Continue empiric treatment for pneumonia - Monitor in SDU  Active Problems:   Sepsis due to pneumonia ([place]) / Lobar pneumonia, unspecified organism ([place]) / Leukocytosis  - Sepsis criteria met on admission with fever, tachycardia, tachypnea, hypoxia, hypotension, leukocytosis, lactic acidosis and CXR with evidence of left lung pneumonia - Sepsis order set utilized - Continue vanco and cefepime  - Blood culture pending    Acute on chronic systolic and diastolic CHF (congestive heart failure) (HCC) / CARDIAC PACEMAKER-[employer]ude - Hypotension precludes use of lasix - Monitor volume status - Cardiology following - Weight since admission: 86.2 kg --> 89.1 kg (not sure if accurate so we will recheck)     Essential hypertension, benign - BP meds on hold due to low BP    CKD (chronic kidney disease) stage 3, GFR 30-59 ml/min - Cr at baseline 1.9 in 05/2015 - Cr within baseline range, 1.81    Anemia of chronic kidney disease - Stable hemoglobin at 10.9    Controlled diabetes mellitus with diabetic nephropathy, with long-term current use of insulin (HCC) - CBG's in past 12- 24 hours: 203, 146, 123 - Continue current insulin regimen     Dyslipidemia associated with type 2 diabetes mellitus (HCC) - Continue statin therapy    Elevated troponin - Due to demand ischemia from sepsis, acute decompensated CHF - No chest pain  - Troponin level 0.20 --> 0.18 --> 0.16 - 2 D ECHO - pending     DVT prophylaxis: SCD's bilaterally  Code Status: full code  Family Communication: no famiy at the bedside this am  Disposition Plan: remains in SDU due to hypotension   Consultants:   Cardiology   WOC  Procedures:   2 D ECHO - pending    Antimicrobials:   Vanco and cefepime 12/18/2015 -->    Subjective: No overnight events.  Objective: Vitals:   12/14/15  0700 12/14/15 0800 12/14/15 0900 12/14/15 1200  BP: 100/60 100/63 (!) 101/51 94/62  Pulse: 100 (!) 102 97 99  Resp: (!) 38 (!) 25 19 (!) 22  Temp:  98.1 F (36.7 C)  98.4 F (36.9 C)  TempSrc:  Oral  Oral  SpO2: (!) 86% 92% 90% 92%  Weight:      Height:        Intake/Output Summary (Last 24 hours) at 12/14/15 1236 Last data filed at 12/14/15 1100  Gross per 24 hour  Intake           443.84 ml  Output              600 ml  Net          -156.16 ml   Filed Weights   11/25/2015 1554 11/30/2015 1618 11/25/2015 1759  Weight: 86.2 kg (190 lb) 87.5  kg (193 lb) 89.1 kg (196 lb 6.9 oz)    Examination:  General exam: Appears calm and comfortable  Respiratory system: Diminished breath sounds, coarse sounds, no wheezing. Cardiovascular system: S1 & S2 heard, Rate controlled  Gastrointestinal system: A(+) BS, non tender  Central nervous system: No focal neurological deficits. Extremities: +1-2 LE edema, no tenderness Skin: No rashes, lesions or ulcers Psychiatry: Judgement and insight appear normal. Mood & affect appropriate.   Data Reviewed: I have personally reviewed following labs and imaging studies  CBC:  Recent Labs Lab 11/29/2015 1604 12/14/15 0111  WBC 22.7* 17.5*  NEUTROABS 18.8*  --   HGB 11.5* 10.9*  HCT 35.2* 34.0*  MCV 99.7 100.3*  PLT 148* 710*   Basic Metabolic Panel:  Recent Labs Lab 11/20/2015 1604 12/06/2015 1946 12/14/15 0111  NA 135  --  133*  K 4.8  --  4.4  CL 97*  --  97*  CO2 28  --  28  GLUCOSE 150*  --  136*  BUN 52*  --  54*  CREATININE 1.86*  --  1.81*  CALCIUM 9.6  --  9.2  MG  --  2.1  --   PHOS  --  3.8  --    GFR: Estimated Creatinine Clearance: 33.5 mL/min (by C-G formula based on SCr of 1.81 mg/dL (H)). Liver Function Tests: No results for input(s): AST, ALT, ALKPHOS, BILITOT, PROT, ALBUMIN in the last 168 hours. No results for input(s): LIPASE, AMYLASE in the last 168 hours. No results for input(s): AMMONIA in the last 168 hours. Coagulation Profile: No results for input(s): INR, PROTIME in the last 168 hours. Cardiac Enzymes:  Recent Labs Lab 11/27/2015 1617 11/30/2015 1946 12/14/15 0111 12/14/15 0801  TROPONINI 0.18* 0.20* 0.18* 0.16*   BNP (last 3 results) No results for input(s): PROBNP in the last 8760 hours. HbA1C: No results for input(s): HGBA1C in the last 72 hours. CBG:  Recent Labs Lab 12/15/2015 1608 12/18/2015 2213 12/14/15 0825 12/14/15 1148  GLUCAP 143* 203* 146* 123*   Lipid Profile: No results for input(s): CHOL, HDL, LDLCALC, TRIG, CHOLHDL, LDLDIRECT  in the last 72 hours. Thyroid Function Tests: No results for input(s): TSH, T4TOTAL, FREET4, T3FREE, THYROIDAB in the last 72 hours. Anemia Panel: No results for input(s): VITAMINB12, FOLATE, FERRITIN, TIBC, IRON, RETICCTPCT in the last 72 hours. Urine analysis:    Component Value Date/Time   COLORURINE YELLOW 10/23/2012 [place] 10/23/2012 0955   LABSPEC 1.013 10/23/2012 0955   PHURINE 7.0 10/23/2012 [place] 10/23/2012 0955   HGBUR NEGATIVE 10/23/2012 0955  [place] NEGATIVE 10/23/2012 [place] 10/23/2012 [place] 10/23/2012 0955   UROBILINOGEN 1.0 10/23/2012 0955   NITRITE NEGATIVE 10/23/2012 0955   LEUKOCYTESUR NEGATIVE 10/23/2012 0955   Sepsis Labs: _0 (procalcitonin:4,lacticidven:4)    Blood Culture (routine x 2)     Status: None (Preliminary result)   Collection Time: 12/15/2015  4:00 PM  Result Value Ref Range Status   Specimen Description   Final    BLOOD LEFT FOREARM Performed at [facility]    Special Requests IN PEDIATRIC BOTTLE  2 CC  Final   Culture PENDING  Incomplete   Report Status PENDING  Incomplete  MRSA PCR Screening     Status: None   Collection Time: 11/29/2015  6:25 PM  Result Value Ref Range Status   MRSA by PCR NEGATIVE NEGATIVE Final      Radiology Studies: Dg Chest Port 1 View Result Date: 11/21/2015 1. Progressive volume loss and basilar opacity in the left hemithorax suspicious for atelectasis or infiltrate. Probable adjacent small left pleural effusion. Followup PA and lateral chest X-ray is recommended in 3-4 weeks (following trial of antibiotic therapy if clinically warranted) to ensure resolution and exclude underlying malignancy. 2. Cardiomegaly with chronic vascular congestion.      Scheduled Meds: . ceFEPime (MAXIPIME) IV  1 g Intravenous Q24H  . dorzolamide  1 drop Left Eye TID  . fluPHENAZine  2.5 mg Oral BID  . fluticasone  2 spray Each Nare  Daily  . gabapentin  100-200 mg Oral TID  . insulin glargine  15 Units Subcutaneous QHS  . latanoprost  1 drop Left Eye QHS  . levothyroxine  25 mcg Oral QAC breakfast  . multivitamin with minerals  1 tablet Oral Daily  . omega-3 acid ethyl esters  2 g Oral Daily  . polyvinyl alcohol  1 drop Left Eye Daily  . prednisoLONE acetate  1 drop Right Eye QHS  . simvastatin  40 mg Oral QPM  . sodium chloride  1,000 mL Intravenous Once   And  . sodium chloride  1,000 mL Intravenous Once   Continuous Infusions: . sodium chloride 10 mL/hr at 12/14/15 0600     LOS: 1 day    Time spent: 25 minutes  Greater than 50% of the time spent on counseling and coordinating the care.   Leisa Lenz, MD [facility] Pager [phone]  If 7PM-7AM, please contact night-coverage [email or web address] Password [facility] 12/14/2015, 12:36 PM

## 2015-12-14 NOTE — Progress Notes (Signed)
Patient choking when taking po fluids.

## 2015-12-14 NOTE — Progress Notes (Signed)
Resumed care of patient. Agree with previous assessment. Chart reviewed. Will continue to monitor.

## 2015-12-14 NOTE — Consult Note (Signed)
[facility] Nurse wound consult note Reason for Consult:Moisture management strategy for sub pannicular skin fold at the central and left aspects.  Fungal overgrowth noted Wound type: Moisture associated skin damage, specifically intertriginous dematitis with fungal over growth. Pressure Ulcer POA: No Measurement:Two areas of involvement:  Central measures 4cm x 4cm and left lateral measures 2cm x 14cm Wound bed:No open wound, but deep red presentation with satellite lesions Drainage (amount, consistency, odor) None Periwound:intact, dry Dressing procedure/placement/frequency: While this will clear in approximately 7-10 days with the use of our house antimicrobial textile, InterDry Ag+ the fungal overgrowth will more rapidly resolve with a one-time dose of a systemic antifungal such as Diflucan.  If you agree, please order. [facility] nursing team will not follow, but will remain available to this patient, the nursing and medical teams.  Please re-consult if needed. Thanks, Maudie Flakes, MSN, RN, [place], Arther Abbott  Pager# [phone]

## 2015-12-14 NOTE — Progress Notes (Signed)
*  PRELIMINARY RESULTS* Echocardiogram 2D Echocardiogram with definity has been performed.  Leavy Cella 12/14/2015, 4:00 PM

## 2015-12-14 NOTE — Care Management Note (Signed)
Case Management Note  Patient Details  Name: Louis Acosta MRN: [id number] Date of Birth: 11/27/1931  Subjective/Objective:     pna versus sepsis               Action/Plan: home   Expected Discharge Date:  12/17/15               Expected Discharge Plan:  Home/Self Care  In-House Referral:     Discharge planning Services     Post Acute Care Choice:    Choice offered to:     DME Arranged:    DME Agency:     HH Arranged:    [place] Agency:     Status of Service:  In process, will continue to follow  If discussed at Long Length of Stay Meetings, dates discussed:    Additional Comments:Date:  December 14, 2015 Chart reviewed for concurrent status and case management needs. Will continue to follow the patient for status change: Discharge Planning: following for needs Expected discharge date: [id number] Velva Harman, BSN, [place], [place]  Leeroy Cha, RN 12/14/2015, 10:58 AM

## 2015-12-15 ENCOUNTER — Encounter: Payer: Medicare Other | Admitting: *Deleted

## 2015-12-15 ENCOUNTER — Ambulatory Visit (INDEPENDENT_AMBULATORY_CARE_PROVIDER_SITE_OTHER): Payer: Medicare Other | Admitting: Ophthalmology

## 2015-12-15 ENCOUNTER — Telehealth: Payer: Self-pay | Admitting: Cardiology

## 2015-12-15 DIAGNOSIS — Z950 Presence of cardiac pacemaker: Secondary | ICD-10-CM

## 2015-12-15 DIAGNOSIS — I5022 Chronic systolic (congestive) heart failure: Secondary | ICD-10-CM

## 2015-12-15 LAB — GLUCOSE, CAPILLARY: Glucose-Capillary: 138 mg/dL — ABNORMAL HIGH (ref 65–99)

## 2015-12-15 LAB — CBC
HCT: 35.9 % — ABNORMAL LOW (ref 39.0–52.0)
Hemoglobin: 11.3 g/dL — ABNORMAL LOW (ref 13.0–17.0)
MCH: 32.3 pg (ref 26.0–34.0)
MCHC: 31.5 g/dL (ref 30.0–36.0)
MCV: 102.6 fL — ABNORMAL HIGH (ref 78.0–100.0)
Platelets: 148 K/uL — ABNORMAL LOW (ref 150–400)
RBC: 3.5 MIL/uL — ABNORMAL LOW (ref 4.22–5.81)
RDW: 15.3 % (ref 11.5–15.5)
WBC: 12.8 K/uL — ABNORMAL HIGH (ref 4.0–10.5)

## 2015-12-15 LAB — BASIC METABOLIC PANEL WITH GFR
Anion gap: 8 (ref 5–15)
BUN: 57 mg/dL — ABNORMAL HIGH (ref 6–20)
CO2: 29 mmol/L (ref 22–32)
Calcium: 9.1 mg/dL (ref 8.9–10.3)
Chloride: 96 mmol/L — ABNORMAL LOW (ref 101–111)
Creatinine, Ser: 1.63 mg/dL — ABNORMAL HIGH (ref 0.61–1.24)
GFR calc Af Amer: 44 mL/min — ABNORMAL LOW
GFR calc non Af Amer: 38 mL/min — ABNORMAL LOW
Glucose, Bld: 147 mg/dL — ABNORMAL HIGH (ref 65–99)
Potassium: 4.7 mmol/L (ref 3.5–5.1)
Sodium: 133 mmol/L — ABNORMAL LOW (ref 135–145)

## 2015-12-15 NOTE — Progress Notes (Signed)
Patient ID: Louis Acosta, male   DOB: 1931/05/02, 82 y.o.   MRN: [id number]  PROGRESS NOTE    Louis Acosta  [id number] DOB: 11-09-1931 DOA: 12/05/2015  PCP: [name], MD   Brief Narrative:   82 y.o. male with medical history significant for hypertension, chronic combined CHF, has pacemaker, DM on insulin, dyslipidemia, CKD stage 3 with baseline Cr 1.9 in 05/2015. He comes from home and his son at the bedside provided most of the history . Pt was in his usual state of health, at baseline able to walk with the walker and cane but over past day PTA he was not able to get up without the assistance. He could not ambulate without the assistance and he almost fell. He was also more short of breath especially when trying to get up. No chest pain, no palpitations.  In ED, BP is 73/58, HR 108, RR 21-29, T max 100.2 F, oxygen saturation 73% on room air but has gotten to 93% with 3-4 L [place] oxygen support. Blood work showed leukocytosis of 22.7, hemoglobin 11.5, platelets 148, Cr 1.86, troponin 0.1, lactic acid 2.7. CXR showed volume loss and basilar opacity in the left hemithorax suspicious for infiltrate. He has received 2 L fluids on admission.    Assessment & Plan:   Principal Problem:   Acute respiratory failure with hypoxia (HCC)  - Stable respiratory status - Oxygen saturation 92% with nasal cannula oxygen support - Continue current nebulizer treatments - Would continue to monitor in step down unit for next 24 hours because of hypotension  Active Problems:   Sepsis due to pneumonia ([place]) / Lobar pneumonia, unspecified organism ([place]) / Leukocytosis  - Sepsis criteria met on admission with fever, tachycardia, tachypnea, hypoxia, hypotension, leukocytosis, lactic acidosis and CXR with evidence of left lung pneumonia - Continue vanco and cefepime  - Blood cultures negative so far    Acute on chronic systolic and diastolic CHF (congestive heart failure) ([place]) / CARDIAC  PACEMAKER-[facility]ude - Compensated - Cardiology is following - Not on Lasix due to hypotension    Essential hypertension, benign - BP meds on hold due to low BP    CKD (chronic kidney disease) stage 3, GFR 30-59 ml/min - Cr at baseline 1.9 in 05/2015 - Cr within baseline range, 1.63 this am    Anemia of chronic kidney disease - Stable hemoglobin at 10.9, 11.3    Controlled diabetes mellitus with diabetic nephropathy, with long-term current use of insulin (HCC) - Continue current insulin regimen     Dyslipidemia associated with type 2 diabetes mellitus (HCC) - Continue statin therapy    Elevated troponin - Due to demand ischemia from sepsis, acute decompensated CHF - No chest pain  - Troponin level 0.20 --> 0.18 --> 0.16 - 2 D ECHO - EF 15%, diffuse hypokinesis     DVT prophylaxis: SCD's bilaterally  Code Status: full code  Family Communication: no famiy at the bedside this am  Disposition Plan: remains in SDU due to hypotension   Consultants:   Cardiology   WOC  Procedures:   2 D ECHO - EF 15%, diffuse hypokinesis     Antimicrobials:   Vanco and cefepime 12/03/2015 -->    Subjective: No overnight events.  Objective: Vitals:   12/15/15 0500 12/15/15 0542 12/15/15 0632 12/15/15 0806  BP:   (!) 94/59   Pulse: 92 90 93   Resp: 20 (!) 24 18   Temp:    97.5 F (36.4 C)  TempSrc:    Oral  SpO2: 96% 98% 92%   Weight: 91.2 kg (201 lb 1 oz)     Height:        Intake/Output Summary (Last 24 hours) at 12/15/15 1030 Last data filed at 12/15/15 0845  Gross per 24 hour  Intake              630 ml  Output             1000 ml  Net             -370 ml   Filed Weights   12/10/2015 1618 11/19/2015 1759 12/15/15 0500  Weight: 87.5 kg (193 lb) 89.1 kg (196 lb 6.9 oz) 91.2 kg (201 lb 1 oz)    Examination:  General exam: Appears calm and comfortable, no distress   Respiratory system: Diminished breath sounds, coarse sounds Cardiovascular system: S1 & S2 heard,  RRR Gastrointestinal system: (+) BS, non tender  Central nervous system: No focal neurological deficits. Extremities: +1-2 LE edema, no tenderness Skin: warm and dry  Psychiatry: Normal mood and behavior   Data Reviewed: I have personally reviewed following labs and imaging studies  CBC:  Recent Labs Lab 12/04/2015 1604 12/14/15 0111 12/15/15 0347  WBC 22.7* 17.5* 12.8*  NEUTROABS 18.8*  --   --   HGB 11.5* 10.9* 11.3*  HCT 35.2* 34.0* 35.9*  MCV 99.7 100.3* 102.6*  PLT 148* 132* 856*   Basic Metabolic Panel:  Recent Labs Lab 11/19/2015 1604 11/23/2015 1946 12/14/15 0111 12/15/15 0347  NA 135  --  133* 133*  K 4.8  --  4.4 4.7  CL 97*  --  97* 96*  CO2 28  --  28 29  GLUCOSE 150*  --  136* 147*  BUN 52*  --  54* 57*  CREATININE 1.86*  --  1.81* 1.63*  CALCIUM 9.6  --  9.2 9.1  MG  --  2.1  --   --   PHOS  --  3.8  --   --    GFR: Estimated Creatinine Clearance: 40.4 mL/min (by C-G formula based on SCr of 1.63 mg/dL (H)). Liver Function Tests: No results for input(s): AST, ALT, ALKPHOS, BILITOT, PROT, ALBUMIN in the last 168 hours. No results for input(s): LIPASE, AMYLASE in the last 168 hours. No results for input(s): AMMONIA in the last 168 hours. Coagulation Profile: No results for input(s): INR, PROTIME in the last 168 hours. Cardiac Enzymes:  Recent Labs Lab 11/28/2015 1617 11/27/2015 1946 12/14/15 0111 12/14/15 0801  TROPONINI 0.18* 0.20* 0.18* 0.16*   BNP (last 3 results) No results for input(s): PROBNP in the last 8760 hours. HbA1C: No results for input(s): HGBA1C in the last 72 hours. CBG:  Recent Labs Lab 12/04/2015 1608 12/10/2015 2213 12/14/15 0825 12/14/15 1148 12/15/15 0740  GLUCAP 143* 203* 146* 123* 138*   Lipid Profile: No results for input(s): CHOL, HDL, LDLCALC, TRIG, CHOLHDL, LDLDIRECT in the last 72 hours. Thyroid Function Tests: No results for input(s): TSH, T4TOTAL, FREET4, T3FREE, THYROIDAB in the last 72 hours. Anemia Panel: No  results for input(s): VITAMINB12, FOLATE, FERRITIN, TIBC, IRON, RETICCTPCT in the last 72 hours. Urine analysis:    Component Value Date/Time   COLORURINE YELLOW 10/23/2012 [place] 10/23/2012 0955   LABSPEC 1.013 10/23/2012 0955   PHURINE 7.0 10/23/2012 [place] 10/23/2012 0955   HGBUR NEGATIVE 10/23/2012 0955   BILIRUBINUR NEGATIVE 10/23/2012 0955   KETONESUR NEGATIVE  10/23/2012 [place] 10/23/2012 0955   UROBILINOGEN 1.0 10/23/2012 0955   NITRITE NEGATIVE 10/23/2012 0955   LEUKOCYTESUR NEGATIVE 10/23/2012 0955   Sepsis Labs: '@[email or web address]'$ (procalcitonin:4,lacticidven:4)    Blood Culture (routine x 2)     Status: None (Preliminary result)   Collection Time: 12/06/2015  4:00 PM  Result Value Ref Range Status   Specimen Description   Final    BLOOD LEFT FOREARM Performed at [facility]    Special Requests IN PEDIATRIC BOTTLE  2 CC  Final   Culture PENDING  Incomplete   Report Status PENDING  Incomplete  MRSA PCR Screening     Status: None   Collection Time: 12/17/2015  6:25 PM  Result Value Ref Range Status   MRSA by PCR NEGATIVE NEGATIVE Final      Radiology Studies: Dg Chest Port 1 View Result Date: 12/12/2015 1. Progressive volume loss and basilar opacity in the left hemithorax suspicious for atelectasis or infiltrate. Probable adjacent small left pleural effusion. Followup PA and lateral chest X-ray is recommended in 3-4 weeks (following trial of antibiotic therapy if clinically warranted) to ensure resolution and exclude underlying malignancy. 2. Cardiomegaly with chronic vascular congestion.      Scheduled Meds: . ceFEPime (MAXIPIME) IV  1 g Intravenous Q24H  . dorzolamide  1 drop Left Eye TID  . fluPHENAZine  2.5 mg Oral BID  . fluticasone  2 spray Each Nare Daily  . gabapentin  100-200 mg Oral TID  . insulin glargine  15 Units Subcutaneous QHS  . latanoprost  1 drop Left Eye QHS  . levothyroxine  25 mcg  Oral QAC breakfast  . multivitamin with minerals  1 tablet Oral Daily  . omega-3 acid ethyl esters  2 g Oral Daily  . polyvinyl alcohol  1 drop Left Eye Daily  . prednisoLONE acetate  1 drop Right Eye QHS  . simvastatin  40 mg Oral QPM  . sodium chloride  1,000 mL Intravenous Once   And  . sodium chloride  1,000 mL Intravenous Once   Continuous Infusions: . sodium chloride 10 mL/hr at 12/15/15 0000     LOS: 2 days    Time spent: 25 minutes  Greater than 50% of the time spent on counseling and coordinating the care.   Leisa Lenz, MD [facility] Pager [phone]  If 7PM-7AM, please contact night-coverage [email or web address] Password [email or web address] 12/15/2015, 10:30 AM

## 2015-12-15 NOTE — Telephone Encounter (Signed)
Confirmed remote transmission w/ pt wife.   

## 2015-12-15 NOTE — Progress Notes (Addendum)
Text paged Lamar Blinks NP report that patient with 12 beats of V-tach, asymptomatic, vss denies needs or wants.  no new orders

## 2015-12-15 NOTE — Progress Notes (Signed)
Date:  December 15, 2015 Chart reviewed for concurrent status and case management needs. Will continue to follow the patient for changes and needs: Wcb 12.8, bun-57,creat.-1.63,Iv vancomycin and iv Levaquin. Discharge Planning: following for needs Velva Harman, BSN, [place], [place]

## 2015-12-15 NOTE — Progress Notes (Signed)
Patient Name: Louis Acosta Date of Encounter: 12/15/2015  Primary Cardiologist: Lendell Caprice, MD / G. Lovena Le, [facility] Problem List     Principal Problem:   Acute respiratory failure with hypoxia [facility]) Active Problems:   Lobar pneumonia, unspecified organism ([place])   Leukocytosis   Sepsis due to pneumonia [facility])   Essential hypertension, benign   CKD (chronic kidney disease) stage 3, GFR 30-59 ml/min   Controlled diabetes mellitus with diabetic nephropathy, with long-term current use of insulin (HCC)   Acute on chronic combined systolic and diastolic CHF (congestive heart failure) ([place])   CARDIAC PACEMAKER-[facility]ude   Dyslipidemia associated with type 2 diabetes mellitus (HCC)   Elevated troponin     Subjective   No recurrent c/p.  Breathing much improved.  No complaints this AM.  Inpatient Medications    . ceFEPime (MAXIPIME) IV  1 g Intravenous Q24H  . fluPHENAZine  2.5 mg Oral BID  . fluticasone  2 spray Each Nare Daily  . gabapentin  100-200 mg Oral TID  . insulin glargine  15 Units Subcutaneous QHS  . levothyroxine  25 mcg Oral QAC breakfast  . multivitamin with minerals  1 tablet Oral Daily  . omega-3 acid ethyl esters  2 g Oral Daily  . polyvinyl alcohol  1 drop Left Eye Daily  . prednisoLONE acetate  1 drop Right Eye QHS  . simvastatin  40 mg Oral QPM  . sodium chloride  1,000 mL Intravenous Once   And  . sodium chloride  1,000 mL Intravenous Once    Vital Signs    Vitals:   12/15/15 0500 12/15/15 0542 12/15/15 0632 12/15/15 0806  BP:   (!) 94/59   Pulse: 92 90 93   Resp: 20 (!) 24 18   Temp:    97.5 F (36.4 C)  TempSrc:    Oral  SpO2: 96% 98% 92%   Weight: 201 lb 1 oz (91.2 kg)     Height:        Intake/Output Summary (Last 24 hours) at 12/15/15 1113 Last data filed at 12/15/15 0845  Gross per 24 hour  Intake              570 ml  Output             1000 ml  Net             -430 ml   Filed Weights   11/25/2015 1618 11/23/2015 1759  12/15/15 0500  Weight: 193 lb (87.5 kg) 196 lb 6.9 oz (89.1 kg) 201 lb 1 oz (91.2 kg)    Physical Exam   GEN: Well nourished, well developed, in no acute distress.  HEENT: Grossly normal.  Neck: Supple, JVP 10-12, no carotid bruits, or masses. Cardiac: RRR, distant, no murmurs, rubs, or gallops. No clubbing, cyanosis, edema.  Radials/DP/PT 2+ and equal bilaterally.  Respiratory:  Respirations regular and unlabored, diminished breath sounds left base. GI: Soft, nontender, nondistended, BS + x 4. MS: no deformity or atrophy. Skin: warm and dry, no rash. Neuro:  Strength and sensation are intact. Psych: AAOx3.  Normal affect.  Labs    CBC  Recent Labs  12/16/2015 1604 12/14/15 0111 12/15/15 0347  WBC 22.7* 17.5* 12.8*  NEUTROABS 18.8*  --   --   HGB 11.5* 10.9* 11.3*  HCT 35.2* 34.0* 35.9*  MCV 99.7 100.3* 102.6*  PLT 148* 132* 123456*   Basic Metabolic Panel  Recent Labs  11/30/2015 1946 12/14/15  0111 12/15/15 0347  NA  --  133* 133*  K  --  4.4 4.7  CL  --  97* 96*  CO2  --  28 29  GLUCOSE  --  136* 147*  BUN  --  54* 57*  CREATININE  --  1.81* 1.63*  CALCIUM  --  9.2 9.1  MG 2.1  --   --   PHOS 3.8  --   --    Cardiac Enzymes  Recent Labs  12/14/2015 1946 12/14/15 0111 12/14/15 0801  TROPONINI 0.20* 0.18* 0.16*    Telemetry    Predominantly A sensed/V paced.  Brief run of slow WCT early this AM.  Radiology    Dg Chest Port 1 View  Result Date: 12/10/2015 CLINICAL DATA:  Chronic shortness of breath. On home oxygen. History of hypertension and diabetes. EXAM: PORTABLE CHEST 1 VIEW COMPARISON:  12/23/2012. FINDINGS: 1633 hours. Mild patient rotation to the left. Left subclavian AICD leads appear grossly unchanged. Old leads are present. The heart is enlarged. There is aortic atherosclerosis. There is progressive volume loss and opacity inferiorly in the left hemithorax with a probable adjacent small left pleural effusion. There is mild overall vascular  congestion without focal opacity in the right lung. The bones appear unchanged. IMPRESSION: 1. Progressive volume loss and basilar opacity in the left hemithorax suspicious for atelectasis or infiltrate. Probable adjacent small left pleural effusion. Followup PA and lateral chest X-ray is recommended in 3-4 weeks (following trial of antibiotic therapy if clinically warranted) to ensure resolution and exclude underlying malignancy. 2. Cardiomegaly with chronic vascular congestion. Electronically Signed   By: Richardean Sale M.D.   On: 12/07/2015 16:57   2D Echocardiogram 9.26.2017  Study Conclusions   - Left ventricle: The cavity size was severely dilated. Wall   thickness was normal. The estimated ejection fraction was 15%.   Diffuse hypokinesis. No LV apical thrombus noted. Doppler   parameters are consistent with abnormal left ventricular   relaxation (grade 1 diastolic dysfunction). - Aortic valve: There was no stenosis. - Mitral valve: There was mild regurgitation. - Left atrium: The atrium was moderately dilated. - Right ventricle: The cavity size was normal. Pacer wire or   catheter noted in right ventricle. Systolic function was   moderately reduced. - Right atrium: The atrium was mildly to moderately dilated. - Tricuspid valve: There was moderate regurgitation. Peak RV-RA   gradient (S): 41 mm Hg. - Pulmonary arteries: PA peak pressure: 56 mm Hg (S). - Systemic veins: IVC measured 2.5 cm with < 50% respirophasic   variation, suggesting RA pressure 15 mmHg. - Pericardium, extracardiac: There is a pleural effusion.  Patient Profile     82 y/o male with h/o CAD, s/p remote LAD stenting, NICM/ chronic systolic HF with prior EF of 15%, h/o CHB s/p biventricular PPM insertion, IDDM, HTN, and HLD admitted for sepsis in the setting of presumed PNA. Cardiology consulted for elevated troponins x 3.   Assessment & Plan    1.  Acute hypoxic resp failure/LLL PNA:  Breathing much improved.   Afebrile, WBC trending down.  Abx per IM.  2. Elevated troponin/CAD:  No chest pain in past 24 hrs.  Says he had nitrate responsive c/p @ home prior to admission.  He doesn't think that that was worse with deep breathing.  Mild troponin elevation with flat trend in setting of above with prior h/o LAD stenting.  Echo shows chronic LV dysfxn with EF of 15%.  Suspect demand  ischemia in setting of #1.  Cath in 2012 showed patent LAD stent.  As previously planned, if he develops recurrent c/p following resolution of pna, would plan inpt vs outpt myoview.  Otw, cont medical therapy once BP better.  Was on  blocker and ARB @ home but currently on hold in setting of relative hypotn.  Cont statin.  Add asa.  3.  Chronic systolic CHF/mixed ICM/NICM:  Ef 15% by echo 9/26 - stable.  S/p CRT-P.  Net neg 586 ml for admission.  Wt is listed as up 5 lbs though looks euvolemic.   blocker, ARB, spiro, lasix currently on hold in setting of soft bp's.  Follow.  4.  CKD III:  Stable.  5.  H/O CHB:  S/p CRT-P.  Followed by Dr. Lovena Le.  6.  DM:  Per IM.  7.  HL:  Cont statin.  Signed, Murray Hodgkins NP 12/15/2015, 11:13 AM    Attending Note:   The patient was seen and examined.  Agree with assessment and plan as noted above.  Changes made to the above note as needed.  Patient seen and independently examined with Ignacia Bayley, NP.   We discussed all aspects of the encounter. I agree with the assessment and plan as stated above.  Pt has chronic systolic chf The troponin levels ar c/w CHF - not ACS Overall he is doing well  No active cardiac issues. Will sign off Will have him follow up with Dr. Irish Lack once he is better     I have spent a total of 30 minutes with patient reviewing hospital  notes , telemetry, EKGs, labs and examining patient as well as establishing an assessment and plan that was discussed with the patient. > 50% of time was spent in direct patient care.    Thayer Headings, Brooke Bonito.,  MD, [facility] 12/15/2015, 12:38 PM [address]  [place] Pager [phone]

## 2015-12-16 ENCOUNTER — Encounter: Payer: Self-pay | Admitting: Interventional Cardiology

## 2015-12-16 LAB — GLUCOSE, CAPILLARY
GLUCOSE-CAPILLARY: 191 mg/dL — AB (ref 65–99)
GLUCOSE-CAPILLARY: 174 mg/dL — AB (ref 65–99)
Glucose-Capillary: 132 mg/dL — ABNORMAL HIGH (ref 65–99)
Glucose-Capillary: 202 mg/dL — ABNORMAL HIGH (ref 65–99)

## 2015-12-16 LAB — BASIC METABOLIC PANEL
Anion gap: 7 (ref 5–15)
BUN: 56 mg/dL — AB (ref 6–20)
CHLORIDE: 95 mmol/L — AB (ref 101–111)
CO2: 32 mmol/L (ref 22–32)
CREATININE: 1.32 mg/dL — AB (ref 0.61–1.24)
Calcium: 9.4 mg/dL (ref 8.9–10.3)
GFR calc non Af Amer: 49 mL/min — ABNORMAL LOW (ref >60)
GFR, EST AFRICAN AMERICAN: 56 mL/min — AB (ref >60)
Glucose, Bld: 122 mg/dL — ABNORMAL HIGH (ref 65–99)
POTASSIUM: 4.7 mmol/L (ref 3.5–5.1)
Sodium: 134 mmol/L — ABNORMAL LOW (ref 135–145)

## 2015-12-16 LAB — CBC
HEMATOCRIT: 36.2 % — AB (ref 39.0–52.0)
Hemoglobin: 11.5 g/dL — ABNORMAL LOW (ref 13.0–17.0)
MCH: 31.5 pg (ref 26.0–34.0)
MCHC: 31.8 g/dL (ref 30.0–36.0)
MCV: 99.2 fL (ref 78.0–100.0)
PLATELETS: 153 10*3/uL (ref 150–400)
RBC: 3.65 MIL/uL — AB (ref 4.22–5.81)
RDW: 14.8 % (ref 11.5–15.5)
WBC: 10.8 10*3/uL — ABNORMAL HIGH (ref 4.0–10.5)

## 2015-12-16 MED ORDER — INSULIN ASPART 100 UNIT/ML ~~LOC~~ SOLN
0.0000 [IU] | Freq: Three times a day (TID) | SUBCUTANEOUS | Status: DC
  Administered 2015-12-16: 3 [IU] via SUBCUTANEOUS
  Administered 2015-12-16: 2 [IU] via SUBCUTANEOUS
  Administered 2015-12-17: 1 [IU] via SUBCUTANEOUS
  Administered 2015-12-17: 2 [IU] via SUBCUTANEOUS

## 2015-12-16 MED ORDER — DEXTROSE 5 % IV SOLN
1.0000 g | Freq: Two times a day (BID) | INTRAVENOUS | Status: DC
  Administered 2015-12-16 – 2015-12-17 (×2): 1 g via INTRAVENOUS
  Filled 2015-12-16 (×2): qty 1

## 2015-12-16 NOTE — Progress Notes (Addendum)
Patient ID: Louis Acosta, male   DOB: 02/02/1932, 82 y.o.   MRN: [id number]  PROGRESS NOTE    Louis Acosta  [id number] DOB: Jan 03, 1932 DOA: 11/20/2015  PCP: [name], MD   Brief Narrative:   82 y.o. male with medical history significant for hypertension, chronic combined CHF, has pacemaker, DM on insulin, dyslipidemia, CKD stage 3 with baseline Cr 1.9 in 05/2015. He comes from home and his son at the bedside provided most of the history . Pt was in his usual state of health, at baseline able to walk with the walker and cane but over past day PTA he was not able to get up without the assistance. He could not ambulate without the assistance and he almost fell. He was also more short of breath especially when trying to get up. No chest pain, no palpitations.  In ED, BP is 73/58, HR 108, RR 21-29, T max 100.2 F, oxygen saturation 73% on room air but has gotten to 93% with 3-4 L [place] oxygen support. Blood work showed leukocytosis of 22.7, hemoglobin 11.5, platelets 148, Cr 1.86, troponin 0.1, lactic acid 2.7. CXR showed volume loss and basilar opacity in the left hemithorax suspicious for infiltrate. He has received 2 L fluids on admission.    Assessment & Plan:   Principal Problem:   Acute respiratory failure with hypoxia (HCC)  - Stable respiratory status - Continue current nebulizer treatments - Transfer to telemetry floor today   Active Problems:   Sepsis due to pneumonia ([place]) / Lobar pneumonia, unspecified organism ([place]) / Leukocytosis  - Sepsis criteria met on admission with fever, tachycardia, tachypnea, hypoxia, hypotension, leukocytosis, lactic acidosis and CXR with evidence of left lung pneumonia - Continue cefepime; vanco stopped 9/28 - Blood cultures negative so far    Acute on chronic systolic and diastolic CHF (congestive heart failure) (HCC) / CARDIAC PACEMAKER-[facility] - Compensated    Essential hypertension, benign - BP meds on hold due to soft  BP    CKD (chronic kidney disease) stage 3, GFR 30-59 ml/min - Cr at baseline 1.9 in 05/2015 - Cr within baseline range and improving     Anemia of chronic kidney disease - Stable hemoglobin at 10.9, 11.3, 11.5    Controlled diabetes mellitus with diabetic nephropathy, with long-term current use of insulin (HCC) - Continue current insulin regimen which is Lantus 15 units at bedtime and add SSI - CBG's in past 24 hours: 123, 138, 132    Diabetic neuropathy - Continue gabapentin    Hypothyroidism - Continue Synthroid    Dyslipidemia associated with type 2 diabetes mellitus (HCC) - Continue simvastatin 40 mg at bedtime and omega 3 supplementation     Elevated troponin - Due to demand ischemia from sepsis, acute decompensated CHF - No chest pain  - Troponin level 0.20 --> 0.18 --> 0.16 - 2 D ECHO - EF 15%, diffuse hypokinesis     DVT prophylaxis: SCD's bilaterally  Code Status: full code  Family Communication: no famiy at the bedside this am; updated patient's daughter over the phone this morning Disposition Plan: Transfer to telemetry floor today   Consultants:   Cardiology   WOC  Procedures:   2 D ECHO - EF 15%, diffuse hypokinesis     Antimicrobials:   Cefepime 12/14/2015 -->   Vanco 9/25 --> 9/28   Subjective: No overnight events.  Objective: Vitals:   12/16/15 0400 12/16/15 0500 12/16/15 0800 12/16/15 0806  BP: 106/64  (!) 102/59  Pulse: (!) 105  (!) 102   Resp: 17  (!) 21   Temp: 97.9 F (36.6 C)   98.2 F (36.8 C)  TempSrc: Axillary   Axillary  SpO2: 93%  97%   Weight:  92.6 kg (204 lb 2.3 oz)    Height:        Intake/Output Summary (Last 24 hours) at 12/16/15 0823 Last data filed at 12/16/15 0537  Gross per 24 hour  Intake              890 ml  Output             1100 ml  Net             -210 ml   Filed Weights   11/22/2015 1759 12/15/15 0500 12/16/15 0500  Weight: 89.1 kg (196 lb 6.9 oz) 91.2 kg (201 lb 1 oz) 92.6 kg (204 lb 2.3  oz)    Examination:  General exam: Calm, comfortable, no acute distress Respiratory system: Coarse breath sounds but no wheezing Cardiovascular system: S1 & S2 heard, rate controlled Gastrointestinal system: (+) BS, non tender, non-distended Central nervous system: Nonfocal  Extremities: +1-2 LE edema, no tenderness Skin: No lesions or ulcers Psychiatry: Normal mood and behavior   Data Reviewed: I have personally reviewed following labs and imaging studies  CBC:  Recent Labs Lab 12/07/2015 1604 12/14/15 0111 12/15/15 0347 12/16/15 0343  WBC 22.7* 17.5* 12.8* 10.8*  NEUTROABS 18.8*  --   --   --   HGB 11.5* 10.9* 11.3* 11.5*  HCT 35.2* 34.0* 35.9* 36.2*  MCV 99.7 100.3* 102.6* 99.2  PLT 148* 132* 148* 378   Basic Metabolic Panel:  Recent Labs Lab 12/04/2015 1604 11/19/2015 1946 12/14/15 0111 12/15/15 0347 12/16/15 0343  NA 135  --  133* 133* 134*  K 4.8  --  4.4 4.7 4.7  CL 97*  --  97* 96* 95*  CO2 28  --  28 29 32  GLUCOSE 150*  --  136* 147* 122*  BUN 52*  --  54* 57* 56*  CREATININE 1.86*  --  1.81* 1.63* 1.32*  CALCIUM 9.6  --  9.2 9.1 9.4  MG  --  2.1  --   --   --   PHOS  --  3.8  --   --   --    GFR: Estimated Creatinine Clearance: 50.2 mL/min (by C-G formula based on SCr of 1.32 mg/dL (H)). Liver Function Tests: No results for input(s): AST, ALT, ALKPHOS, BILITOT, PROT, ALBUMIN in the last 168 hours. No results for input(s): LIPASE, AMYLASE in the last 168 hours. No results for input(s): AMMONIA in the last 168 hours. Coagulation Profile: No results for input(s): INR, PROTIME in the last 168 hours. Cardiac Enzymes:  Recent Labs Lab 11/21/2015 1617 11/21/2015 1946 12/14/15 0111 12/14/15 0801  TROPONINI 0.18* 0.20* 0.18* 0.16*   BNP (last 3 results) No results for input(s): PROBNP in the last 8760 hours. HbA1C: No results for input(s): HGBA1C in the last 72 hours. CBG:  Recent Labs Lab 11/22/2015 2213 12/14/15 0825 12/14/15 1148 12/15/15 0740  12/16/15 0750  GLUCAP 203* 146* 123* 138* 132*   Lipid Profile: No results for input(s): CHOL, HDL, LDLCALC, TRIG, CHOLHDL, LDLDIRECT in the last 72 hours. Thyroid Function Tests: No results for input(s): TSH, T4TOTAL, FREET4, T3FREE, THYROIDAB in the last 72 hours. Anemia Panel: No results for input(s): VITAMINB12, FOLATE, FERRITIN, TIBC, IRON, RETICCTPCT in the last 72 hours. Urine analysis:  Component Value Date/Time   COLORURINE YELLOW 10/23/2012 0955   APPEARANCEUR CLEAR 10/23/2012 0955   LABSPEC 1.013 10/23/2012 0955   PHURINE 7.0 10/23/2012 0955   GLUCOSEU NEGATIVE 10/23/2012 0955   HGBUR NEGATIVE 10/23/2012 [place] 10/23/2012 [place] 10/23/2012 0955   PROTEINUR NEGATIVE 10/23/2012 0955   UROBILINOGEN 1.0 10/23/2012 0955   NITRITE NEGATIVE 10/23/2012 0955   LEUKOCYTESUR NEGATIVE 10/23/2012 0955   Sepsis Labs: _0 (procalcitonin:4,lacticidven:4)    Blood Culture (routine x 2)     Status: None (Preliminary result)   Collection Time: 12/12/2015  4:00 PM  Result Value Ref Range Status   Specimen Description   Final    BLOOD LEFT FOREARM Performed at [facility]    Special Requests IN PEDIATRIC BOTTLE  2 CC  Final   Culture PENDING  Incomplete   Report Status PENDING  Incomplete  MRSA PCR Screening     Status: None   Collection Time: 12/09/2015  6:25 PM  Result Value Ref Range Status   MRSA by PCR NEGATIVE NEGATIVE Final      Radiology Studies: Dg Chest Port 1 View Result Date: 11/21/2015 1. Progressive volume loss and basilar opacity in the left hemithorax suspicious for atelectasis or infiltrate. Probable adjacent small left pleural effusion. Followup PA and lateral chest X-ray is recommended in 3-4 weeks (following trial of antibiotic therapy if clinically warranted) to ensure resolution and exclude underlying malignancy. 2. Cardiomegaly with chronic vascular congestion.      Scheduled Meds: . ceFEPime  (MAXIPIME) IV  1 g Intravenous Q24H  . fluPHENAZine  2.5 mg Oral BID  . fluticasone  2 spray Each Nare Daily  . gabapentin  100-200 mg Oral TID  . insulin glargine  15 Units Subcutaneous QHS  . levothyroxine  25 mcg Oral QAC breakfast  . multivitamin with minerals  1 tablet Oral Daily  . omega-3 acid ethyl esters  2 g Oral Daily  . polyvinyl alcohol  1 drop Left Eye Daily  . prednisoLONE acetate  1 drop Right Eye QHS  . simvastatin  40 mg Oral QPM  . sodium chloride  1,000 mL Intravenous Once   And  . sodium chloride  1,000 mL Intravenous Once   Continuous Infusions: . sodium chloride 10 mL/hr at 12/15/15 0000     LOS: 3 days    Time spent: 25 minutes  Greater than 50% of the time spent on counseling and coordinating the care.   Leisa Lenz, MD [facility] Pager [phone]  If 7PM-7AM, please contact night-coverage [email or web address] Password [facility] 12/16/2015, 8:23 AM

## 2015-12-16 NOTE — Plan of Care (Signed)
Problem: Safety: Goal: Ability to remain free from injury will improve Outcome: Progressing Pt has a hx of fall prior to admission. Bed alarm set. Daughter at bedside

## 2015-12-16 NOTE — Progress Notes (Signed)
Pt stated that 'what you guys are doing isn't working'.  Pt has labored breathing.  Checked pulse ox and pulse.  Pulse ox on 4L at 78%. Pulse 102. Upped pt to 7L and added humidifier.  Pulse ox 93%.  Pulse 89.  Labored breathing subsided and pt resting comfortably.  Will continue to monitor.  Roselind Rily

## 2015-12-16 NOTE — Progress Notes (Signed)
Report called to RN.  Danton Clap, RN

## 2015-12-16 NOTE — Progress Notes (Signed)
RN contacted by CMT. Pt had 8 beats VTACH. Pt assessed, asymptomatic. VS stable 104/70,96, 18, 92% 3 lt. Provider paged with update.

## 2015-12-16 NOTE — Progress Notes (Signed)
Pharmacy Antibiotic Note  Louis Acosta is a 82 y.o. male admitted on 11/19/2015 with sepsis.  He reports 1 day hx of weakness and increased shortness of breath.  Code SEPSIS called. Pharmacy has been consulted for Vancomycin & Levaquin dosing.  Antibiotics subsequently changed to Vancomycin and Cefepime, then narrowed to Cefepime monotherapy.   12/16/2015: D4 Abx  Afebrile  WBC improved  AoCKD-III - SCr improving, CrCl now 50 ml/min  Plan:  Increase to Cefepime 1g IV q12h  F/u improving renal function, cultures, clinical course  Height: 5\' 11"  (180.3 cm) Weight: 204 lb 2.3 oz (92.6 kg) IBW/kg (Calculated) : 75.3  Temp (24hrs), Avg:97.8 F (36.6 C), Min:97.4 F (36.3 C), Max:98.2 F (36.8 C)   Recent Labs Lab 12/12/2015 1604 11/25/2015 1627 12/14/15 0111 12/15/15 0347 12/16/15 0343  WBC 22.7*  --  17.5* 12.8* 10.8*  CREATININE 1.86*  --  1.81* 1.63* 1.32*  LATICACIDVEN  --  2.70*  --   --   --     Estimated Creatinine Clearance: 50.2 mL/min (by C-G formula based on SCr of 1.32 mg/dL (H)).    Allergies  Allergen Reactions  . Clindamycin/Lincomycin Nausea And Vomiting    Antimicrobials this admission: 9/25 Levaquin >> 9/25 9/25 Vanc >> 9/25 9/26 Cefepime>> 9/26 fluconazole>>9/26   Dose adjustments this admission:   Microbiology results: 9/25 BCx x2: NGTD 9/25 MRSA PCR (-)   Thank you for allowing pharmacy to be a part of this patient's care.  Ralene Bathe, PharmD, BCPS 12/16/2015, 1:50 PM  Pager: [phone]

## 2015-12-17 ENCOUNTER — Encounter: Payer: Self-pay | Admitting: Cardiology

## 2015-12-17 LAB — BASIC METABOLIC PANEL
Anion gap: 7 (ref 5–15)
BUN: 52 mg/dL — ABNORMAL HIGH (ref 6–20)
CHLORIDE: 97 mmol/L — AB (ref 101–111)
CO2: 29 mmol/L (ref 22–32)
CREATININE: 1.13 mg/dL (ref 0.61–1.24)
Calcium: 9.3 mg/dL (ref 8.9–10.3)
GFR calc non Af Amer: 59 mL/min — ABNORMAL LOW (ref >60)
Glucose, Bld: 127 mg/dL — ABNORMAL HIGH (ref 65–99)
POTASSIUM: 5 mmol/L (ref 3.5–5.1)
Sodium: 133 mmol/L — ABNORMAL LOW (ref 135–145)

## 2015-12-17 LAB — GLUCOSE, CAPILLARY
GLUCOSE-CAPILLARY: 109 mg/dL — AB (ref 65–99)
GLUCOSE-CAPILLARY: 152 mg/dL — AB (ref 65–99)
Glucose-Capillary: 125 mg/dL — ABNORMAL HIGH (ref 65–99)
Glucose-Capillary: 172 mg/dL — ABNORMAL HIGH (ref 65–99)

## 2015-12-17 MED ORDER — DEXTROSE 5 % IV SOLN
1.0000 g | Freq: Three times a day (TID) | INTRAVENOUS | Status: DC
  Administered 2015-12-17 – 2015-12-18 (×3): 1 g via INTRAVENOUS
  Filled 2015-12-17 (×4): qty 1

## 2015-12-17 NOTE — Progress Notes (Addendum)
Patient ID: Louis Acosta, male   DOB: [birth year], 82 y.o.   MRN: [id number]  PROGRESS NOTE    Louis Acosta  [id number] DOB: May 02, 1931 DOA: 11/26/2015  PCP: [name], MD   Brief Narrative:   82 y.o. male with medical history significant for hypertension, chronic combined CHF, has pacemaker, DM on insulin, dyslipidemia, CKD stage 3 with baseline Cr 1.9 in 05/2015. He comes from home and his son at the bedside provided most of the history . Pt was in his usual state of health, at baseline able to walk with the walker and cane but over past day PTA he was not able to get up without the assistance. He could not ambulate without the assistance and he almost fell. He was also more short of breath especially when trying to get up. No chest pain, no palpitations.  In ED, BP is 73/58, HR 108, RR 21-29, T max 100.2 F, oxygen saturation 73% on room air but has gotten to 93% with 3-4 L [place] oxygen support. Blood work showed leukocytosis of 22.7, hemoglobin 11.5, platelets 148, Cr 1.86, troponin 0.1, lactic acid 2.7. CXR showed volume loss and basilar opacity in the left hemithorax suspicious for infiltrate. He has received 2 L fluids on admission.    Assessment & Plan:   Principal Problem:   Acute respiratory failure with hypoxia (HCC)  - Stable respiratory status - Continue current nebulizer treatments  Active Problems:   Sepsis due to pneumonia ([place]) / Lobar pneumonia, unspecified organism ([place]) / Leukocytosis  - Sepsis criteria met on admission with fever, tachycardia, tachypnea, hypoxia, hypotension, leukocytosis, lactic acidosis and CXR with evidence of left lung pneumonia - Continue cefepime; vanco stopped 9/28 - Blood cultures negative so far    Acute on chronic systolic and diastolic CHF (congestive heart failure) ([place]) / CARDIAC PACEMAKER-[facility] - Compensated    Essential hypertension, benign - BP meds on hold due to soft BP - BP 110/69 this am    CKD (chronic  kidney disease) stage 3, GFR 30-59 ml/min - Cr at baseline 1.9 in 05/2015 - Cr now WNL    Anemia of chronic kidney disease - Stable hemoglobin     Controlled diabetes mellitus with diabetic nephropathy, with long-term current use of insulin (HCC) - Continue current insulin regimen which is Lantus 15 units at bedtime and add SSI    Diabetic neuropathy - Continue gabapentin    Hypothyroidism - Continue Synthroid    Dyslipidemia associated with type 2 diabetes mellitus (HCC) - Continue simvastatin 40 mg at bedtime and omega 3 supplementation     Elevated troponin - Due to demand ischemia from sepsis, acute decompensated CHF - No chest pain  - Troponin level 0.20 --> 0.18 --> 0.16 - 2 D ECHO - EF 15%, diffuse hypokinesis     DVT prophylaxis: SCD's bilaterally  Code Status: full code  Family Communication: no famiy at the bedside this am; updated patient's daughter over the phone this morning Disposition Plan: Needs PT eval for safe d/c plan; d/c likely in next 1-2 days if resp status remains stable    Consultants:   Cardiology   WOC  Procedures:   2 D ECHO - EF 15%, diffuse hypokinesis     Antimicrobials:   Cefepime 12/08/2015 -->   Vanco 9/25 --> 9/28   Subjective: No overnight events.  Objective: Vitals:   12/16/15 1943 12/16/15 1945 12/16/15 2225 12/17/15 0547  BP:   103/68 110/69  Pulse: (!) 103 89 (!)  101 (!) 103  Resp:   20 20  Temp:   97.6 F (36.4 C) 97.6 F (36.4 C)  TempSrc:   Oral Oral  SpO2: (!) 78% 93% 94% 95%  Weight:    92.6 kg (204 lb 3.2 oz)  Height:        Intake/Output Summary (Last 24 hours) at 12/17/15 0912 Last data filed at 12/17/15 0046  Gross per 24 hour  Intake           228.83 ml  Output              500 ml  Net          -271.17 ml   Filed Weights   12/15/15 0500 12/16/15 0500 12/17/15 0547  Weight: 91.2 kg (201 lb 1 oz) 92.6 kg (204 lb 2.3 oz) 92.6 kg (204 lb 3.2 oz)    Examination:  General exam: no acute  distress Respiratory system: Coarse breath sounds, no wheezing Cardiovascular system: S1 & S2 heard, rate controlled Gastrointestinal system: (+) BS, non tender Central nervous system: No focal deficits  Extremities: LE much better, trace edema only, non tender extremities  Skin: skin warm and dry  Psychiatry: Normal mood and behavior, not agitated   Data Reviewed: I have personally reviewed following labs and imaging studies  CBC:  Recent Labs Lab 12/15/2015 1604 12/14/15 0111 12/15/15 0347 12/16/15 0343  WBC 22.7* 17.5* 12.8* 10.8*  NEUTROABS 18.8*  --   --   --   HGB 11.5* 10.9* 11.3* 11.5*  HCT 35.2* 34.0* 35.9* 36.2*  MCV 99.7 100.3* 102.6* 99.2  PLT 148* 132* 148* 062   Basic Metabolic Panel:  Recent Labs Lab 12/12/2015 1604 12/12/2015 1946 12/14/15 0111 12/15/15 0347 12/16/15 0343 12/17/15 0538  NA 135  --  133* 133* 134* 133*  K 4.8  --  4.4 4.7 4.7 5.0  CL 97*  --  97* 96* 95* 97*  CO2 28  --  28 29 32 29  GLUCOSE 150*  --  136* 147* 122* 127*  BUN 52*  --  54* 57* 56* 52*  CREATININE 1.86*  --  1.81* 1.63* 1.32* 1.13  CALCIUM 9.6  --  9.2 9.1 9.4 9.3  MG  --  2.1  --   --   --   --   PHOS  --  3.8  --   --   --   --    GFR: Estimated Creatinine Clearance: 58.6 mL/min (by C-G formula based on SCr of 1.13 mg/dL). Liver Function Tests: No results for input(s): AST, ALT, ALKPHOS, BILITOT, PROT, ALBUMIN in the last 168 hours. No results for input(s): LIPASE, AMYLASE in the last 168 hours. No results for input(s): AMMONIA in the last 168 hours. Coagulation Profile: No results for input(s): INR, PROTIME in the last 168 hours. Cardiac Enzymes:  Recent Labs Lab 12/16/2015 1617 11/25/2015 1946 12/14/15 0111 12/14/15 0801  TROPONINI 0.18* 0.20* 0.18* 0.16*   BNP (last 3 results) No results for input(s): PROBNP in the last 8760 hours. HbA1C: No results for input(s): HGBA1C in the last 72 hours. CBG:  Recent Labs Lab 12/16/15 0750 12/16/15 1202  12/16/15 1651 12/16/15 2224 12/17/15 0755  GLUCAP 132* 202* 191* 174* 125*   Lipid Profile: No results for input(s): CHOL, HDL, LDLCALC, TRIG, CHOLHDL, LDLDIRECT in the last 72 hours. Thyroid Function Tests: No results for input(s): TSH, T4TOTAL, FREET4, T3FREE, THYROIDAB in the last 72 hours. Anemia Panel: No results for input(s): VITAMINB12,  FOLATE, FERRITIN, TIBC, IRON, RETICCTPCT in the last 72 hours. Urine analysis:    Component Value Date/Time   COLORURINE YELLOW 10/23/2012 [place] 10/23/2012 0955   LABSPEC 1.013 10/23/2012 0955   PHURINE 7.0 10/23/2012 0955   GLUCOSEU NEGATIVE 10/23/2012 0955   HGBUR NEGATIVE 10/23/2012 0955   BILIRUBINUR NEGATIVE 10/23/2012 0955   KETONESUR NEGATIVE 10/23/2012 0955   PROTEINUR NEGATIVE 10/23/2012 0955   UROBILINOGEN 1.0 10/23/2012 0955   NITRITE NEGATIVE 10/23/2012 0955   LEUKOCYTESUR NEGATIVE 10/23/2012 0955   Sepsis Labs: '@[email or web address]'$ (procalcitonin:4,lacticidven:4)    Blood Culture (routine x 2)     Status: None (Preliminary result)   Collection Time: 11/23/2015  4:00 PM  Result Value Ref Range Status   Specimen Description   Final    BLOOD LEFT FOREARM Performed at [facility]    Special Requests IN PEDIATRIC BOTTLE  2 CC  Final   Culture PENDING  Incomplete   Report Status PENDING  Incomplete  MRSA PCR Screening     Status: None   Collection Time: 11/29/2015  6:25 PM  Result Value Ref Range Status   MRSA by PCR NEGATIVE NEGATIVE Final      Radiology Studies: Dg Chest Port 1 View Result Date: 11/24/2015 1. Progressive volume loss and basilar opacity in the left hemithorax suspicious for atelectasis or infiltrate. Probable adjacent small left pleural effusion. Followup PA and lateral chest X-ray is recommended in 3-4 weeks (following trial of antibiotic therapy if clinically warranted) to ensure resolution and exclude underlying malignancy. 2. Cardiomegaly with chronic vascular congestion.       Scheduled Meds: . ceFEPime (MAXIPIME) IV  1 g Intravenous Q8H  . fluPHENAZine  2.5 mg Oral BID  . fluticasone  2 spray Each Nare Daily  . gabapentin  100-200 mg Oral TID  . insulin aspart  0-9 Units Subcutaneous TID WC  . insulin glargine  15 Units Subcutaneous QHS  . levothyroxine  25 mcg Oral QAC breakfast  . multivitamin with minerals  1 tablet Oral Daily  . omega-3 acid ethyl esters  2 g Oral Daily  . polyvinyl alcohol  1 drop Left Eye Daily  . prednisoLONE acetate  1 drop Right Eye QHS  . simvastatin  40 mg Oral QPM  . sodium chloride  1,000 mL Intravenous Once   And  . sodium chloride  1,000 mL Intravenous Once   Continuous Infusions: . sodium chloride 10 mL/hr at 12/15/15 0000     LOS: 4 days    Time spent: 15 minutes  Greater than 50% of the time spent on counseling and coordinating the care.   Leisa Lenz, MD [facility] Pager [phone]  If 7PM-7AM, please contact night-coverage [email or web address] Password [email or web address] 12/17/2015, 9:12 AM

## 2015-12-17 NOTE — Evaluation (Signed)
Physical Therapy Evaluation Patient Details Name: Louis Acosta MRN: [id number] DOB: September 11, 1931 Today's Date: 12/17/2015   History of Present Illness  82 yo male admitted with acute respiratory failure, sepsis due to pna. Hx of HTN, CHF, pacemaker, DM, CKD  Clinical Impression  On eval, pt required Max assist for mobility. He sat EOB for ~10 minutes with Mod assist. Pt is very weak. Family reports pt has been hallucinating since he has been in the hospital. Recommend ST rehab at SNF at this time. Will follow and progress activity as tolerated.     Follow Up Recommendations SNF    Equipment Recommendations  None recommended by PT    Recommendations for Other Services OT consult     Precautions / Restrictions Precautions Precautions: Fall Restrictions Weight Bearing Restrictions: No      Mobility  Bed Mobility Overal bed mobility: Needs Assistance Bed Mobility: Supine to Sit;Sit to Supine     Supine to sit: Max assist;HOB elevated Sit to supine: Max assist   General bed mobility comments: Assist for trunk and bil LEs. Pt initiated but he wasnt able to complete tasks without significant help. Utilized bedpad for scooting, positioning.   Transfers                 General transfer comment: NT-pt unable-too weak and difficulty keeping eyes open  Ambulation/Gait                Stairs            Wheelchair Mobility    Modified Rankin (Stroke Patients Only)       Balance Overall balance assessment: Needs assistance Sitting-balance support: Bilateral upper extremity supported;Feet supported Sitting balance-Leahy Scale: Poor Sitting balance - Comments: Sat EOB for at least 10 minutes with Mod assist.  Postural control: Left lateral lean                                   Pertinent Vitals/Pain Pain Assessment: Faces Faces Pain Scale: No hurt    Home Living Family/patient expects to be discharged to:: Private residence Living  Arrangements: Spouse/significant other;Children Available Help at Discharge: Family Type of Home: House Home Access: Stairs to enter   Technical brewer of Steps: 1 Home Layout: Two level;Bed/bath upstairs Home Equipment: Walker - 2 wheels;Wheelchair - manual      Prior Function Level of Independence: Needs assistance   Gait / Transfers Assistance Needed: ambulatory with RW prior to admission  ADL's / Homemaking Assistance Needed: assist into tub/shower        Hand Dominance        Extremity/Trunk Assessment   Upper Extremity Assessment: Generalized weakness;Difficult to assess due to impaired cognition           Lower Extremity Assessment: Generalized weakness;Difficult to assess due to impaired cognition      Cervical / Trunk Assessment: Kyphotic  Communication   Communication: HOH  Cognition Arousal/Alertness: Lethargic Behavior During Therapy: WFL for tasks assessed/performed Overall Cognitive Status: Impaired/Different from baseline Area of Impairment: Safety/judgement;Problem solving         Safety/Judgement: Decreased awareness of safety;Decreased awareness of deficits   Problem Solving: Requires tactile cues;Requires verbal cues;Difficulty sequencing;Decreased initiation;Slow processing General Comments: family present-report pt having hallucinations    General Comments      Exercises General Exercises - Upper Extremity Shoulder Flexion: AROM;Both;5 reps;Seated General Exercises - Lower Extremity Long Arc Quad: AROM;Both;5 reps;Seated  Assessment/Plan    PT Assessment Patient needs continued PT services  PT Problem List Decreased strength;Decreased mobility;Decreased range of motion;Decreased activity tolerance;Decreased balance;Pain;Decreased cognition;Decreased knowledge of use of DME;Decreased safety awareness          PT Treatment Interventions DME instruction;Gait training;Functional mobility training;Therapeutic  activities;Therapeutic exercise;Balance training;Patient/family education    PT Goals (Current goals can be found in the Care Plan section)  Acute Rehab PT Goals Patient Stated Goal: per family, regain PLOF PT Goal Formulation: With family Time For Goal Achievement: 12/31/15 Potential to Achieve Goals: Fair    Frequency Min 3X/week   Barriers to discharge        Co-evaluation               End of Session Equipment Utilized During Treatment: Gait belt;Oxygen Activity Tolerance: Patient limited by fatigue;Patient limited by lethargy Patient left: in bed;with call bell/phone within reach;with family/visitor present;with bed alarm set           Time: [id number] PT Time Calculation (min) (ACUTE ONLY): 25 min   Charges:   PT Evaluation $PT Eval Low Complexity: 1 Procedure PT Treatments $Therapeutic Activity: 8-22 mins   PT G Codes:        Weston Anna, MPT Pager: [phone]

## 2015-12-17 NOTE — Progress Notes (Signed)
Pharmacy Antibiotic Note  Louis Acosta is a 82 y.o. male admitted on 12/07/2015 with sepsis.  He reports 1 day hx of weakness and increased shortness of breath.  Code SEPSIS called. Pharmacy has been consulted for Vancomycin & Levaquin dosing.  Antibiotics subsequently changed to Vancomycin and Cefepime, then narrowed to Cefepime monotherapy.   12/17/2015: D5 Abx  Afebrile  WBC improved  AoCKD-III - SCr continues to improve, warranting further dose adjustment  Plan:  Increase to Cefepime 1g IV q8h  F/u improving renal function, cultures, clinical course  Height: 5\' 11"  (180.3 cm) Weight: 204 lb 3.2 oz (92.6 kg) IBW/kg (Calculated) : 75.3  Temp (24hrs), Avg:97.7 F (36.5 C), Min:97.6 F (36.4 C), Max:97.9 F (36.6 C)   Recent Labs Lab 11/22/2015 1604 11/22/2015 1627 12/14/15 0111 12/15/15 0347 12/16/15 0343 12/17/15 0538  WBC 22.7*  --  17.5* 12.8* 10.8*  --   CREATININE 1.86*  --  1.81* 1.63* 1.32* 1.13  LATICACIDVEN  --  2.70*  --   --   --   --     Estimated Creatinine Clearance: 58.6 mL/min (by C-G formula based on SCr of 1.13 mg/dL).    Allergies  Allergen Reactions  . Clindamycin/Lincomycin Nausea And Vomiting    Antimicrobials this admission: 9/25 Levaquin >> 9/25 9/25 Vanc >> 9/25 9/26 Cefepime>> 9/26 fluconazole>>9/26   Dose adjustments this admission: 9/28 Cefepime 1g q24h --> q12h 9/29 Cefepime 1g q12h --> q8h    Microbiology results: 9/25 BCx x2: NGTD 9/25 MRSA PCR (-)   Thank you for allowing pharmacy to be a part of this patient's care.  Hershal Coria, PharmD, BCPS Pager: [phone] 12/17/2015 8:30 AM

## 2015-12-18 ENCOUNTER — Inpatient Hospital Stay (HOSPITAL_COMMUNITY): Payer: Medicare Other

## 2015-12-18 DIAGNOSIS — J189 Pneumonia, unspecified organism: Secondary | ICD-10-CM

## 2015-12-18 DIAGNOSIS — I5021 Acute systolic (congestive) heart failure: Secondary | ICD-10-CM

## 2015-12-18 DIAGNOSIS — G9340 Encephalopathy, unspecified: Secondary | ICD-10-CM

## 2015-12-18 DIAGNOSIS — J9612 Chronic respiratory failure with hypercapnia: Secondary | ICD-10-CM

## 2015-12-18 LAB — GLUCOSE, CAPILLARY
GLUCOSE-CAPILLARY: 108 mg/dL — AB (ref 65–99)
GLUCOSE-CAPILLARY: 116 mg/dL — AB (ref 65–99)
GLUCOSE-CAPILLARY: 96 mg/dL (ref 65–99)
Glucose-Capillary: 133 mg/dL — ABNORMAL HIGH (ref 65–99)

## 2015-12-18 LAB — BLOOD GAS, ARTERIAL
Acid-Base Excess: 3.1 mmol/L — ABNORMAL HIGH (ref 0.0–2.0)
Bicarbonate: 29.6 mmol/L — ABNORMAL HIGH (ref 20.0–28.0)
DRAWN BY: 313061
FIO2: 0.55
O2 SAT: 97 %
PATIENT TEMPERATURE: 98.6
pCO2 arterial: 56.5 mmHg — ABNORMAL HIGH (ref 32.0–48.0)
pH, Arterial: 7.338 — ABNORMAL LOW (ref 7.350–7.450)
pO2, Arterial: 96.5 mmHg (ref 83.0–108.0)

## 2015-12-18 LAB — BASIC METABOLIC PANEL
ANION GAP: 7 (ref 5–15)
Anion gap: 8 (ref 5–15)
BUN: 59 mg/dL — AB (ref 6–20)
BUN: 62 mg/dL — ABNORMAL HIGH (ref 6–20)
CO2: 29 mmol/L (ref 22–32)
CO2: 32 mmol/L (ref 22–32)
CREATININE: 1.42 mg/dL — AB (ref 0.61–1.24)
Calcium: 9.5 mg/dL (ref 8.9–10.3)
Calcium: 9.7 mg/dL (ref 8.9–10.3)
Chloride: 97 mmol/L — ABNORMAL LOW (ref 101–111)
Chloride: 96 mmol/L — ABNORMAL LOW (ref 101–111)
Creatinine, Ser: 1.39 mg/dL — ABNORMAL HIGH (ref 0.61–1.24)
GFR calc Af Amer: 52 mL/min — ABNORMAL LOW (ref >60)
GFR calc Af Amer: 53 mL/min — ABNORMAL LOW (ref >60)
GFR, EST NON AFRICAN AMERICAN: 44 mL/min — AB (ref >60)
GFR, EST NON AFRICAN AMERICAN: 46 mL/min — AB (ref >60)
GLUCOSE: 132 mg/dL — AB (ref 65–99)
Glucose, Bld: 115 mg/dL — ABNORMAL HIGH (ref 65–99)
POTASSIUM: 5.8 mmol/L — AB (ref 3.5–5.1)
POTASSIUM: 5.6 mmol/L — AB (ref 3.5–5.1)
SODIUM: 135 mmol/L (ref 135–145)
Sodium: 134 mmol/L — ABNORMAL LOW (ref 135–145)

## 2015-12-18 LAB — CULTURE, BLOOD (ROUTINE X 2)
CULTURE: NO GROWTH
CULTURE: NO GROWTH

## 2015-12-18 LAB — CBC
HCT: 39.7 % (ref 39.0–52.0)
Hemoglobin: 12.6 g/dL — ABNORMAL LOW (ref 13.0–17.0)
MCH: 32.3 pg (ref 26.0–34.0)
MCHC: 31.7 g/dL (ref 30.0–36.0)
MCV: 101.8 fL — ABNORMAL HIGH (ref 78.0–100.0)
PLATELETS: 140 10*3/uL — AB (ref 150–400)
RBC: 3.9 MIL/uL — AB (ref 4.22–5.81)
RDW: 15 % (ref 11.5–15.5)
WBC: 9 10*3/uL (ref 4.0–10.5)

## 2015-12-18 LAB — TSH: TSH: 5.675 u[IU]/mL — AB (ref 0.350–4.500)

## 2015-12-18 LAB — MRSA PCR SCREENING: MRSA BY PCR: NEGATIVE

## 2015-12-18 LAB — T4, FREE: Free T4: 0.8 ng/dL (ref 0.61–1.12)

## 2015-12-18 MED ORDER — INSULIN ASPART 100 UNIT/ML ~~LOC~~ SOLN
0.0000 [IU] | SUBCUTANEOUS | Status: DC
  Administered 2015-12-20 – 2015-12-21 (×3): 1 [IU] via SUBCUTANEOUS
  Administered 2015-12-21: 2 [IU] via SUBCUTANEOUS
  Administered 2015-12-21 (×2): 1 [IU] via SUBCUTANEOUS
  Administered 2015-12-21: 2 [IU] via SUBCUTANEOUS
  Administered 2015-12-22 – 2015-12-25 (×16): 1 [IU] via SUBCUTANEOUS

## 2015-12-18 MED ORDER — IPRATROPIUM BROMIDE 0.02 % IN SOLN
0.5000 mg | RESPIRATORY_TRACT | Status: DC
  Administered 2015-12-18: 0.5 mg via RESPIRATORY_TRACT
  Filled 2015-12-18: qty 2.5

## 2015-12-18 MED ORDER — ORAL CARE MOUTH RINSE
15.0000 mL | Freq: Two times a day (BID) | OROMUCOSAL | Status: DC
  Administered 2015-12-18 – 2015-12-24 (×12): 15 mL via OROMUCOSAL

## 2015-12-18 MED ORDER — IPRATROPIUM BROMIDE 0.02 % IN SOLN
0.5000 mg | Freq: Two times a day (BID) | RESPIRATORY_TRACT | Status: DC
  Administered 2015-12-18 – 2015-12-25 (×14): 0.5 mg via RESPIRATORY_TRACT
  Filled 2015-12-18 (×15): qty 2.5

## 2015-12-18 MED ORDER — DEXTROSE 5 % IV SOLN
1.0000 g | Freq: Two times a day (BID) | INTRAVENOUS | Status: DC
  Administered 2015-12-18 – 2015-12-21 (×6): 1 g via INTRAVENOUS
  Filled 2015-12-18 (×6): qty 1

## 2015-12-18 MED ORDER — LEVALBUTEROL HCL 1.25 MG/0.5ML IN NEBU
1.2500 mg | INHALATION_SOLUTION | RESPIRATORY_TRACT | Status: DC
  Administered 2015-12-18: 1.25 mg via RESPIRATORY_TRACT
  Filled 2015-12-18: qty 0.5

## 2015-12-18 MED ORDER — LORAZEPAM 2 MG/ML IJ SOLN
1.0000 mg | Freq: Once | INTRAMUSCULAR | Status: DC
  Filled 2015-12-18: qty 1

## 2015-12-18 MED ORDER — FUROSEMIDE 10 MG/ML IJ SOLN
20.0000 mg | Freq: Once | INTRAMUSCULAR | Status: AC
  Administered 2015-12-18: 20 mg via INTRAVENOUS
  Filled 2015-12-18: qty 2

## 2015-12-18 MED ORDER — LEVALBUTEROL HCL 1.25 MG/0.5ML IN NEBU
1.2500 mg | INHALATION_SOLUTION | Freq: Two times a day (BID) | RESPIRATORY_TRACT | Status: DC
  Administered 2015-12-18 – 2015-12-25 (×14): 1.25 mg via RESPIRATORY_TRACT
  Filled 2015-12-18 (×15): qty 0.5

## 2015-12-18 MED ORDER — LORAZEPAM 2 MG/ML IJ SOLN: 1.0000 mg | Freq: Once | INTRAMUSCULAR | Status: DC

## 2015-12-18 NOTE — Progress Notes (Signed)
Pt with increased anxiety over night per report from on-going RN warranting the need for restraints. Decreased in O2 sats. sats has lingered btw 83% -86% on a venturi mask (55% on 10 liters).  D Pt lethargic with intermittent periods of moaning and restlessness. Dr. Charlies Silvers made aware.  New orders placed. VWilliams,rn.

## 2015-12-18 NOTE — Progress Notes (Signed)
Pt transferred to stepdown rm 1432. Left unit in bed. Arrived in stable condition. VWilliams,rn.

## 2015-12-18 NOTE — Progress Notes (Signed)
Patient pulled IV, tele and condom cath, and will not let staff put them back on. And oxygen level continue to drop, patient refusing to keep oxygen on as well., will continue to assess  patien

## 2015-12-18 NOTE — Progress Notes (Signed)
Patient ID: Louis Acosta, male   DOB: [birth year], 82 y.o.   MRN: [id number]  PROGRESS NOTE    Louis Acosta  TWS:[id number] DOB: 1931-08-29 DOA: 11/24/2015  PCP: [name], MD   Brief Narrative:   82 y.o. male with medical history significant for hypertension, chronic combined CHF, has pacemaker, DM on insulin, dyslipidemia, CKD stage 3 with baseline Cr 1.9 in 05/2015. He comes from home and his son at the bedside provided most of the history . Pt was in his usual state of health, at baseline able to walk with the walker and cane but over past day PTA he was not able to get up without the assistance. He could not ambulate without the assistance and he almost fell. He was also more short of breath especially when trying to get up. No chest pain, no palpitations.  In ED, BP is 73/58, HR 108, RR 21-29, T max 100.2 F, oxygen saturation 73% on room air but has gotten to 93% with 3-4 L [place] oxygen support. Blood work showed leukocytosis of 22.7, hemoglobin 11.5, platelets 148, Cr 1.86, troponin 0.1, lactic acid 2.7. CXR showed volume loss and basilar opacity in the left hemithorax suspicious for infiltrate. He has received 2 L fluids on admission.   Pt more hypoxic this am, O2 sat's in mid 80's on Ventimask. Ordered CXR and ABG and transferred to SDU. CCM will see the pt in consultation.    Assessment & Plan:   Principal Problem:   Acute respiratory failure with hypoxia (HCC)  - In more respiratory distress this am - Obtain CXR and ABG - Transfer to SDU - Consulted CCM, appreciate their help  - Continue xopenex and atrovent every 2 hours as needed for shortness of breath or wheezing - Added xopenex and atrovent every 4 hours scheduled - Continue venti mask to keep O2 sats above 90%; may require BiPAP  Active Problems:   Sepsis due to pneumonia ([place]) / Lobar pneumonia, unspecified organism ([place]) / Leukocytosis  - Sepsis criteria met on admission with fever, tachycardia,  tachypnea, hypoxia, hypotension, leukocytosis, lactic acidosis and CXR with evidence of left lung pneumonia - Continue cefepime; vanco stopped 9/28 - Blood cultures negative so far    Acute on chronic systolic and diastolic CHF (congestive heart failure) ([place]) / CARDIAC PACEMAKER-[facility] - Compensated - No LE edema    Essential hypertension, benign - BP meds on hold due to soft BP    CKD (chronic kidney disease) stage 3, GFR 30-59 ml/min - Cr at baseline 1.9 in 05/2015 - Follow up BMP this am    Anemia of chronic kidney disease - Stable hemoglobin  - Follow up CBC this am    Controlled diabetes mellitus with diabetic nephropathy, with long-term current use of insulin (HCC) - Continue Lantus 15 units at bedtime and add SSI    Diabetic neuropathy - Continue gabapentin    Hypothyroidism - Continue Synthroid    Dyslipidemia associated with type 2 diabetes mellitus (HCC) - Continue simvastatin 40 mg at bedtime and omega 3 supplementation     Elevated troponin - Due to demand ischemia from sepsis, acute decompensated CHF - No chest pain  - Troponin level 0.20 --> 0.18 --> 0.16 - 2 D ECHO - EF 15%, diffuse hypokinesis     DVT prophylaxis: SCD's bilaterally  Code Status: full code  Family Communication: no famiy at the bedside this am; updated patient's daughter over the phone this morning Disposition Plan: Transfer to SDU  Consultants:   Cardiology   WOC  PCCM  Procedures:   2 D ECHO - EF 15%, diffuse hypokinesis     Antimicrobials:   Cefepime 11/30/2015 -->   Vanco 9/25 --> 9/28   Subjective: No overnight events.  Objective: Vitals:   12/17/15 0547 12/17/15 1351 12/17/15 2028 12/18/15 0430  BP: 110/69 104/65 (!) 97/59 97/62  Pulse: (!) 103 100 (!) 103 92  Resp: 20 (!) 24 (!) 22 (!) 24  Temp: 97.6 F (36.4 C) 97.6 F (36.4 C) 97.3 F (36.3 C) 97.5 F (36.4 C)  TempSrc: Oral Oral Tympanic Axillary  SpO2: 95% (!) 85% 93% 98%  Weight: 92.6  kg (204 lb 3.2 oz)   91.1 kg (200 lb 13.4 oz)  Height:        Intake/Output Summary (Last 24 hours) at 12/18/15 0811 Last data filed at 12/18/15 0038  Gross per 24 hour  Intake           327.66 ml  Output              650 ml  Net          -322.34 ml   Filed Weights   12/16/15 0500 12/17/15 0547 12/18/15 0430  Weight: 92.6 kg (204 lb 2.3 oz) 92.6 kg (204 lb 3.2 oz) 91.1 kg (200 lb 13.4 oz)    Examination:  General exam: on Venti mask, in resp distress Respiratory system: Coarse breath sounds, no wheezing Cardiovascular system: S1 & S2 heard, rate controlled Gastrointestinal system: (+) BS, non tender Central nervous system: Nonfocal  Extremities: palpable pulses, no tenderness  Skin: no lesions or ulcers  Psychiatry: Normal mood and behavior, not agitated   Data Reviewed: I have personally reviewed following labs and imaging studies  CBC:  Recent Labs Lab 11/23/2015 1604 12/14/15 0111 12/15/15 0347 12/16/15 0343  WBC 22.7* 17.5* 12.8* 10.8*  NEUTROABS 18.8*  --   --   --   HGB 11.5* 10.9* 11.3* 11.5*  HCT 35.2* 34.0* 35.9* 36.2*  MCV 99.7 100.3* 102.6* 99.2  PLT 148* 132* 148* 259   Basic Metabolic Panel:  Recent Labs Lab 11/23/2015 1946 12/14/15 0111 12/15/15 0347 12/16/15 0343 12/17/15 0538 12/18/15 0515  NA  --  133* 133* 134* 133* 134*  K  --  4.4 4.7 4.7 5.0 5.8*  CL  --  97* 96* 95* 97* 97*  CO2  --  28 29 32 29 29  GLUCOSE  --  136* 147* 122* 127* 132*  BUN  --  54* 57* 56* 52* 59*  CREATININE  --  1.81* 1.63* 1.32* 1.13 1.42*  CALCIUM  --  9.2 9.1 9.4 9.3 9.5  MG 2.1  --   --   --   --   --   PHOS 3.8  --   --   --   --   --    GFR: Estimated Creatinine Clearance: 46.3 mL/min (by C-G formula based on SCr of 1.42 mg/dL (H)). Liver Function Tests: No results for input(s): AST, ALT, ALKPHOS, BILITOT, PROT, ALBUMIN in the last 168 hours. No results for input(s): LIPASE, AMYLASE in the last 168 hours. No results for input(s): AMMONIA in the last 168  hours. Coagulation Profile: No results for input(s): INR, PROTIME in the last 168 hours. Cardiac Enzymes:  Recent Labs Lab 12/11/2015 1617 12/04/2015 1946 12/14/15 0111 12/14/15 0801  TROPONINI 0.18* 0.20* 0.18* 0.16*   BNP (last 3 results) No results for input(s): PROBNP in the  last 8760 hours. HbA1C: No results for input(s): HGBA1C in the last 72 hours. CBG:  Recent Labs Lab 12/17/15 0755 12/17/15 1158 12/17/15 1710 12/17/15 2130 12/18/15 0722  GLUCAP 125* 109* 172* 152* 133*   Lipid Profile: No results for input(s): CHOL, HDL, LDLCALC, TRIG, CHOLHDL, LDLDIRECT in the last 72 hours. Thyroid Function Tests: No results for input(s): TSH, T4TOTAL, FREET4, T3FREE, THYROIDAB in the last 72 hours. Anemia Panel: No results for input(s): VITAMINB12, FOLATE, FERRITIN, TIBC, IRON, RETICCTPCT in the last 72 hours. Urine analysis:    Component Value Date/Time   COLORURINE YELLOW 10/23/2012 [place] 10/23/2012 0955   LABSPEC 1.013 10/23/2012 0955   PHURINE 7.0 10/23/2012 0955   GLUCOSEU NEGATIVE 10/23/2012 0955   HGBUR NEGATIVE 10/23/2012 0955   BILIRUBINUR NEGATIVE 10/23/2012 0955   KETONESUR NEGATIVE 10/23/2012 0955   PROTEINUR NEGATIVE 10/23/2012 0955   UROBILINOGEN 1.0 10/23/2012 0955   NITRITE NEGATIVE 10/23/2012 0955   LEUKOCYTESUR NEGATIVE 10/23/2012 0955   Sepsis Labs: '@[email or web address]'$ (procalcitonin:4,lacticidven:4)    Blood Culture (routine x 2)     Status: None (Preliminary result)   Collection Time: 11/24/2015  4:00 PM  Result Value Ref Range Status   Specimen Description   Final    BLOOD LEFT FOREARM Performed at [facility]    Special Requests IN PEDIATRIC BOTTLE  2 CC  Final   Culture PENDING  Incomplete   Report Status PENDING  Incomplete  MRSA PCR Screening     Status: None   Collection Time: 12/04/2015  6:25 PM  Result Value Ref Range Status   MRSA by PCR NEGATIVE NEGATIVE Final      Radiology Studies: Dg Chest Port 1  View Result Date: 11/20/2015 1. Progressive volume loss and basilar opacity in the left hemithorax suspicious for atelectasis or infiltrate. Probable adjacent small left pleural effusion. Followup PA and lateral chest X-ray is recommended in 3-4 weeks (following trial of antibiotic therapy if clinically warranted) to ensure resolution and exclude underlying malignancy. 2. Cardiomegaly with chronic vascular congestion.      Scheduled Meds: . ceFEPime (MAXIPIME) IV  1 g Intravenous Q8H  . fluPHENAZine  2.5 mg Oral BID  . fluticasone  2 spray Each Nare Daily  . gabapentin  100-200 mg Oral TID  . insulin aspart  0-9 Units Subcutaneous TID WC  . insulin glargine  15 Units Subcutaneous QHS  . levothyroxine  25 mcg Oral QAC breakfast  . LORazepam  1 mg Intramuscular Once  . multivitamin with minerals  1 tablet Oral Daily  . omega-3 acid ethyl esters  2 g Oral Daily  . polyvinyl alcohol  1 drop Left Eye Daily  . prednisoLONE acetate  1 drop Right Eye QHS  . simvastatin  40 mg Oral QPM  . sodium chloride  1,000 mL Intravenous Once   And  . sodium chloride  1,000 mL Intravenous Once   Continuous Infusions: . sodium chloride 10 mL/hr at 12/15/15 0000     LOS: 5 days    Time spent: 25 minutes  Greater than 50% of the time spent on counseling and coordinating the care.   Leisa Lenz, MD [facility] Pager [phone]  If 7PM-7AM, please contact night-coverage [email or web address] Password [email or web address] 12/18/2015, 8:11 AM

## 2015-12-18 NOTE — Progress Notes (Signed)
Pharmacy Antibiotic Note  Louis Acosta is a 82 y.o. male admitted on 11/23/2015 with sepsis.  He reports 1 day hx of weakness and increased shortness of breath.  Code SEPSIS called. Pharmacy has been consulted for Vancomycin & Levaquin dosing.  Antibiotics subsequently changed to Vancomycin and Cefepime, then narrowed to Cefepime monotherapy.   12/18/2015: D6 Abx  Afebrile  WBC improved  AoCKD-III - SCr rising again, CrCl 40-46.  Plan:  Decrease Cefepime from 1g IV q8h to q12h.  F/u cultures, clinical course  Height: 5\' 11"  (180.3 cm) Weight: 200 lb 13.4 oz (91.1 kg) IBW/kg (Calculated) : 75.3  Temp (24hrs), Avg:97.5 F (36.4 C), Min:97.3 F (36.3 C), Max:97.6 F (36.4 C)   Recent Labs Lab 12/07/2015 1604 11/25/2015 1627 12/14/15 0111 12/15/15 0347 12/16/15 0343 12/17/15 0538 12/18/15 0515  WBC 22.7*  --  17.5* 12.8* 10.8*  --   --   CREATININE 1.86*  --  1.81* 1.63* 1.32* 1.13 1.42*  LATICACIDVEN  --  2.70*  --   --   --   --   --     Estimated Creatinine Clearance: 46.3 mL/min (by C-G formula based on SCr of 1.42 mg/dL (H)).    Allergies  Allergen Reactions  . Clindamycin/Lincomycin Nausea And Vomiting   Antimicrobials this admission: 9/25 Levaquin >> 9/25 9/25 Vanc >> 9/25 9/26 cefepime >> 9/26 fluconazole >> 9/26   Dose adjustments this admission: 9/28 Cefepime 1g q24h --> q12h 9/29 Cefepime 1g q12h --> q8h   Microbiology results: 9/25 BCx x2: NGTD 9/25 MRSA PCR (-)   Thank you for allowing pharmacy to be a part of this patient's care.  Romeo Rabon, PharmD, pager [phone]. 12/18/2015,10:28 AM.

## 2015-12-18 NOTE — Progress Notes (Signed)
Oxygen saturation 70s and 80s, patient pulled oxygen tube off, refusing face mask or N/C back on, M.Lynch notified

## 2015-12-18 NOTE — Consult Note (Signed)
PULMONARY / CRITICAL CARE MEDICINE   Name: Louis Acosta MRN: [id number] DOB: 1931-11-24    ADMISSION DATE:  11/30/2015 CONSULTATION DATE:  12/18/2015  REFERRING MD:  Leisa Lenz, M.D. / [facility]  CHIEF COMPLAINT:  Acute Hypoxic Respiratory Failure  HISTORY OF PRESENT ILLNESS:  82 y.o. male with known history of chronic congestive heart failure status post pacemaker placement along with diabetes, hypertension, and chronic renal failure stage III. At baseline the patient is able to walk with a cane and walker but in the day prior to arrival patient was having difficulty getting up without assistance. Patient indeed almost fell per electronic medical record. Patient was also noted to have dyspnea when attempting to get up as well. Per electronic medical record patient denied any chest pain or palpitations. In the emergency department he was noted to have hypotension with a systolic blood pressure of 73 and was saturating 73% on room air. The patient required 3-4 L/m of oxygen via nasal cannula to achieve a saturation of 93%. With patient's leukocytosis on arrival and basilar opacities. He was started on broad-spectrum antibiotic coverage. He was also administered 2 L of IV fluid on admission. On the morning of transfer to the stepdown unit patient was noted to have a saturation in the mid 80s on Ventimask. PCCM was consulted to assist in evaluation of the patient's hypoxia. Patient appears to have increased oxygen requirements up to 7 L/m via nasal cannula since 9/28. Patient is altered and unable to provide further history or review of systems.  PAST MEDICAL HISTORY :  Past Medical History:  Diagnosis Date  . Anginal pain ([place])    within the last 2 wks has taken Ntg 1 time  . Arthritis   . Cardiomyopathy    broken 3x's  . CHF (congestive heart failure) (HCC)    takes Furosemide daily  . Chronic back pain   . Confusion    short term memory loss  . Diabetes mellitus    type 2;takes Lantus  nightly and Humalog prn  . Diverticulosis   . Dysrhythmia    HX OF COMPLETE HEART BLOCK;takes Metoprolol daily  . Enlarged prostate    takes [employer]  . Glaucoma    uses several drops  . H/O hiatal hernia   . History of colon polyps   . History of fracture of nose   . History of shingles   . History of staph infection    50+yrs ago  . Hyperlipidemia    takes Simvastatin daily  . Hypertension    takes Losartan daily  . Joint pain   . Nocturia   . Pacemaker    St Jude  . PONV (postoperative nausea and vomiting)   . Seasonal allergies    uses Flonase prn and takes Claritin prn  . Shortness of breath    lying and with exertion  . Skin spots, red     PAST SURGICAL HISTORY: Past Surgical History:  Procedure Laterality Date  . BACK SURGERY     x 2  . bilateral cataract surgery    . COLONOSCOPY    . EPICARDIAL PACING LEAD PLACEMENT N/A 10/24/2012   Procedure: EPICARDIAL PACING LEAD PLACEMENT;  Surgeon: Gaye Pollack, MD;  Location: [facility] OR;  Service: Thoracic;  Laterality: N/A;  LV EPICARDIAL LEADS  . ESOPHAGOGASTRODUODENOSCOPY    . INSERT / REPLACE / REMOVE PACEMAKER     x 3  . JOINT REPLACEMENT     right knee arthroplasty  .  LEAD REVISION N/A 09/30/2012   Procedure: LEAD REVISION;  Surgeon: Evans Lance, MD;  Location: [facility] CATH LAB;  Service: Cardiovascular;  Laterality: N/A;  . PACEMAKER LEAD REMOVAL N/A 08/21/2012   Procedure: PACEMAKER LEAD REMOVAL;  Surgeon: Evans Lance, MD;  Location: [place];  Service: Cardiovascular;  Laterality: N/A;  . right eye lasik    . THORACOTOMY Left 10/24/2012   Procedure: THORACOTOMY MAJOR;  Surgeon: Gaye Pollack, MD;  Location: [facility] OR;  Service: Thoracic;  Laterality: Left;  . TRANSTHORACIC ECHOCARDIOGRAM  01/2007    Allergies  Allergen Reactions  . Clindamycin/Lincomycin Nausea And Vomiting    No current facility-administered medications on file prior to encounter.    Current Outpatient Prescriptions on File Prior to Encounter   Medication Sig  . B Complex Vitamins (VITAMIN-B COMPLEX PO) Take 1 tablet by mouth daily.   . dorzolamide (TRUSOPT) 2 % ophthalmic solution Place 1 drop into the left eye 3 (three) times daily.   . fluticasone (FLONASE) 50 MCG/ACT nasal spray Place 2 sprays into the nose daily.   . furosemide (LASIX) 40 MG tablet Take 40-120 mg by mouth 3 (three) times daily. Take 3 tablets in the am, Take 2 tablets in the pm & Take 1 tablet qhs  . insulin lispro (HUMALOG) 100 UNIT/ML injection Inject 15-20 Units into the skin daily as needed for high blood sugar.   . latanoprost (XALATAN) 0.005 % ophthalmic solution Place 1 drop into the left eye at bedtime.   Marland Kitchen losartan (COZAAR) 25 MG tablet TAKE 1 TABLET BY MOUTH DAILY  . Misc Natural Products (OSTEO BI-FLEX TRIPLE STRENGTH PO) Take 2 tablets by mouth daily.   . Multiple Vitamins-Minerals (MULTIVITAMIN WITH MINERALS) tablet Take 1 tablet by mouth daily.   . Omega-3 1400 MG CAPS Take 2 capsules by mouth daily.  Vladimir Faster Glycol-Propyl Glycol (SYSTANE ULTRA OP) Place 1 drop into the left eye every morning.   . potassium gluconate 595 MG TABS Take 595 mg by mouth daily.  . Saw Palmetto 450 MG CAPS Take 450 mg by mouth 2 (two) times daily.  . simvastatin (ZOCOR) 40 MG tablet Take 40 mg by mouth every evening.   Marland Kitchen spironolactone (ALDACTONE) 25 MG tablet Take 25 mg by mouth daily. 1 IN A.M.  . TOUJEO SOLOSTAR 300 UNIT/ML SOPN Inject 30 Units into the skin at bedtime.   Marland Kitchen ACCU-CHEK AVIVA PLUS test strip 1 each by Other route as needed.   Marland Kitchen ACCU-CHEK FASTCLIX LANCETS MISC Apply 1 each topically as directed.   Marland Kitchen acetaminophen (TYLENOL) 650 MG CR tablet Take 650 mg by mouth every 8 (eight) hours as needed for pain.  Marland Kitchen gabapentin (NEURONTIN) 100 MG capsule Take 100-200 mg by mouth 3 (three) times daily.  Marland Kitchen loratadine (CLARITIN) 10 MG tablet Take 10 mg by mouth daily as needed for allergies.   Marland Kitchen NITROSTAT 0.4 MG SL tablet TAKE 1 TABLET UNDER THE TONGUE AS NEEDEDFOR  CHEST PAIN (MAY REPEAT EVERY 5 MIN X3 DOSES, CALL 911 IF NO RELIEF.)    FAMILY HISTORY:  Family History  Problem Relation Age of Onset  . Heart disease Mother      SOCIAL HISTORY: Social History   Social History  . Marital status: Married    Spouse name: N/A  . Number of children: N/A  . Years of education: N/A   Occupational History  . retired    Social History Main Topics  . Smoking status: Former Smoker  Quit date: 03/20/1968  . Smokeless tobacco: Never Used  . Alcohol use No  . Drug use: No  . Sexual activity: No   Other Topics Concern  . None   Social History Narrative  . None    REVIEW OF SYSTEMS:  Unable to obtain given altered mental status.  SUBJECTIVE: As above.  VITAL SIGNS: BP 97/62 (BP Location: Right Arm)   Pulse 92   Temp 97.5 F (36.4 C) (Axillary)   Resp (!) 24   Ht 5\' 11"  (1.803 m)   Wt 200 lb 13.4 oz (91.1 kg)   SpO2 98%   BMI 28.01 kg/m   HEMODYNAMICS:    VENTILATOR SETTINGS:    INTAKE / OUTPUT: I/O last 3 completed shifts: In: 327.7 [I.V.:227.7; IV Piggyback:100] Out: 1050 [Urine:1050]  PHYSICAL EXAMINATION: General:  Eyes closed. No family at bedside. Slightly obese male. No distress. Integument:  Warm & dry. No rash on exposed skin.  Lymphatics:  No appreciated cervical or supraclavicular lymphadenoapthy. HEENT:  Moist mucus membranes. No scleral injection or icterus. Venti mask in place. Cardiovascular:  Regular rhythm. Bilateral upper extremity edema left greater than right. No appreciable JVD.  Pulmonary:  Diminished breath sounds bilateral lung bases. No accessory muscle use on venti mask. Abdomen: Soft. Normal bowel sounds. Protuberant. Musculoskeletal:  Normal bulk and tone. No joint deformity or effusion appreciated. Neurological:  Attempts to answer questions. Confused about place, year, and president. Doesn't follow commands. Psychiatric:  Patient has altered mentation. Appears calm and pleasant.    LABS:  BMET  Recent Labs Lab 12/16/15 0343 12/17/15 0538 12/18/15 0515  NA 134* 133* 134*  K 4.7 5.0 5.8*  CL 95* 97* 97*  CO2 32 29 29  BUN 56* 52* 59*  CREATININE 1.32* 1.13 1.42*  GLUCOSE 122* 127* 132*    Electrolytes  Recent Labs Lab 12/12/2015 1946  12/16/15 0343 12/17/15 0538 12/18/15 0515  CALCIUM  --   < > 9.4 9.3 9.5  MG 2.1  --   --   --   --   PHOS 3.8  --   --   --   --   < > = values in this interval not displayed.  CBC  Recent Labs Lab 12/14/15 0111 12/15/15 0347 12/16/15 0343  WBC 17.5* 12.8* 10.8*  HGB 10.9* 11.3* 11.5*  HCT 34.0* 35.9* 36.2*  PLT 132* 148* 153    Coag's No results for input(s): APTT, INR in the last 168 hours.  Sepsis Markers  Recent Labs Lab 12/04/2015 1617 11/27/2015 1627  LATICACIDVEN  --  2.70*  PROCALCITON 1.48  --     ABG  Recent Labs Lab 12/18/15 0848  PHART 7.338*  PCO2ART 56.5*  PO2ART 96.5    Liver Enzymes No results for input(s): AST, ALT, ALKPHOS, BILITOT, ALBUMIN in the last 168 hours.  Cardiac Enzymes  Recent Labs Lab 11/23/2015 1946 12/14/15 0111 12/14/15 0801  TROPONINI 0.20* 0.18* 0.16*    Glucose  Recent Labs Lab 12/16/15 2224 12/17/15 0755 12/17/15 1158 12/17/15 1710 12/17/15 2130 12/18/15 0722  GLUCAP 174* 125* 109* 172* 152* 133*    Imaging Dg Chest Port 1 View  Result Date: 12/18/2015 CLINICAL DATA:  Hypoxia EXAM: PORTABLE CHEST 1 VIEW COMPARISON:  12/14/2015 FINDINGS: Left subclavian AICD device and leads are stable. Leads are intact. Moderate cardiomegaly. Left base opacified. Hazy airspace disease at the right base. Vascular congestion. No evidence of Kerley B line. IMPRESSION: Stable dense opacity at the left base. Hazy airspace disease  at the right base. Findings are not significantly changed. Cardiomegaly and vascular congestion. Electronically Signed   By: Marybelle Killings M.D.   On: 12/18/2015 10:11     STUDIES:  TTE 9/26: LV severely dilated with EF 15% and  diffuse hypokinesis. No apical thrombus noted. Grade 1 diastolic dysfunction. LA moderately dilated & RA mild to moderately dilated. RV normal in size with moderately reduced systolic function. Pulmonary artery systolic pressure 56 mmHg. No aortic stenosis or regurgitation. Aortic root normal in size. Mild mitral regurgitation without stenosis. No pulmonic regurgitation. Moderate tricuspid regurgitation. Unclear if there is a pericardial effusion. IVC 2.5 cm with less than 50% respiratory phase variation.  PORT CXR 9/30:  Personally reviewed by me. Suggestion of cardiomegaly with vascular congestion. Questionable left basilar opacity relatively unchanged. Slight worsening of right hemidiaphragm silhouetting. Pacer wires/AICD wires in place.  MICROBIOLOGY: MRSA PCR 9/25:  Negative Blood Ctx x2 9/25 >>  ANTIBIOTICS: Vancomycin 9/25 x1 dose Cefepime 9/25 >>  SIGNIFICANT EVENTS: 9/25 - Admit w/ hypoxia requiring BiPAP 9/30 - Transferred back to SDU w/ worsening in hypoxia  LINES/TUBES: PIV  DISCUSSION:  82 y.o. male with severe CAP on presentation & hypotension that responded to antibiotic therapy and IV fluid bolus. Patient currently 10 pounds over his admission weight and Lasix has been on hold since admission. I reviewed the patient's echocardiogram with his wife and daughter via phone. Given his underlying cardiomyopathy and multiple medical problems I feel there would be a low probability of extubation if patient did undergo endotracheal intubation. Additionally, we also discussed the low probability of success with resuscitation efforts in the event of a cardiac arrest. Patient's daughter agreed that her father would not wish to undergo intubation or resuscitation. We'll continue to closely monitor the patient and gently diurese with intermittent Lasix to avoid hypotension.  ASSESSMENT / PLAN:  PULMONARY A: Acute Hypoxic Respiratory Failure Chronic Hypercarbic Respiratory Failure  P:    Weaning FiO2 for Sat 88-94% to minimize VQ mismatching Continuing Xopenex & Atrovent nebs q4hr Continuous Pulse Oximetry Lasix 20mg  IV x1  CARDIOVASCULAR A:  Shock - Resolved w/ IVF in ED on arrival. Combined Systolic & Diastolic CHF - EF 0000000 on [id number] TTE. Weight is up 10 pounds since admission.  H/O HTN & Dyslipidemia S/P AICD/Pacemaker Placement  P:  Continuous telemetry monitoring Vitals per unit protocol Holding Lovaza & Zocor Lasix 20mg  IV x1  RENAL A:   Acute on Chronic Renal Failure Stage 3 - Improving. Hyperkalemia - Mild. H/O Enlarged Prostate  P:   Monitoring UOP Trending electrolytes & renal function daily w/ diuresis  GASTROINTESTINAL A:   H/O Hiatal Hernia  P:   NPO pending improvement in mental status.  HEMATOLOGIC A:   Leukocytosis - Resolved. Likely due to sepsis. Thrombocytopenia - Resolved. Likely due to sepsis. Anemia - Mild. No signs of active bleeding.  P:  SCDs  INFECTIOUS A:   Sepsis Possible Severe CAP  P:   Currently on Day #6 of Empiric Cefepime Blood Cultures Pending  ENDOCRINE A:   H/O DM Hypothyroidism    P:   Lantus 15u [place] qhs Accu-Checks q4hr SSI per Sensitive Algorithm Holding Synthroid daily Checking TSH & Free T4  NEUROLOGIC A:   Acute Encephalopathy - Multifactorial with likely a strong component of delirium. Memory Loss  P:   Holding Neurontin Discontinuing AM Lorazepam Recommend avoiding sedating medications   FAMILY  - Updates:  Wife Enid Derry updated by Dr. Ashok Cordia via phone & daughter Lattie Haw  updated via phone. Wife defers to daughter in matters relating to the care of the patient. Daughter reports father would not want resuscitation and agrees to DNR/DNI.  - Inter-disciplinary family meet or Palliative Care meeting due by:  10/7   Sonia Baller. Ashok Cordia, M.D. [facility] Pulmonary & Critical Care Pager:  [phone] After 3pm or if no response, call [phone] 12/18/2015, 10:24 AM

## 2015-12-19 DIAGNOSIS — A419 Sepsis, unspecified organism: Principal | ICD-10-CM

## 2015-12-19 LAB — BASIC METABOLIC PANEL
ANION GAP: 7 (ref 5–15)
BUN: 64 mg/dL — ABNORMAL HIGH (ref 6–20)
CALCIUM: 9.7 mg/dL (ref 8.9–10.3)
CO2: 31 mmol/L (ref 22–32)
CREATININE: 1.27 mg/dL — AB (ref 0.61–1.24)
Chloride: 98 mmol/L — ABNORMAL LOW (ref 101–111)
GFR, EST AFRICAN AMERICAN: 59 mL/min — AB (ref >60)
GFR, EST NON AFRICAN AMERICAN: 51 mL/min — AB (ref >60)
Glucose, Bld: 76 mg/dL (ref 65–99)
Potassium: 5.3 mmol/L — ABNORMAL HIGH (ref 3.5–5.1)
SODIUM: 136 mmol/L (ref 135–145)

## 2015-12-19 LAB — GLUCOSE, CAPILLARY
GLUCOSE-CAPILLARY: 76 mg/dL (ref 65–99)
GLUCOSE-CAPILLARY: 88 mg/dL (ref 65–99)
GLUCOSE-CAPILLARY: 79 mg/dL (ref 65–99)
Glucose-Capillary: 130 mg/dL — ABNORMAL HIGH (ref 65–99)
Glucose-Capillary: 61 mg/dL — ABNORMAL LOW (ref 65–99)
Glucose-Capillary: 69 mg/dL (ref 65–99)
Glucose-Capillary: 71 mg/dL (ref 65–99)
Glucose-Capillary: 75 mg/dL (ref 65–99)
Glucose-Capillary: 98 mg/dL (ref 65–99)

## 2015-12-19 LAB — CBC
HCT: 36.5 % — ABNORMAL LOW (ref 39.0–52.0)
HEMOGLOBIN: 12 g/dL — AB (ref 13.0–17.0)
MCH: 32.4 pg (ref 26.0–34.0)
MCHC: 32.9 g/dL (ref 30.0–36.0)
MCV: 98.6 fL (ref 78.0–100.0)
Platelets: 139 10*3/uL — ABNORMAL LOW (ref 150–400)
RBC: 3.7 MIL/uL — AB (ref 4.22–5.81)
RDW: 14.8 % (ref 11.5–15.5)
WBC: 8.7 10*3/uL (ref 4.0–10.5)

## 2015-12-19 MED ORDER — FUROSEMIDE 10 MG/ML IJ SOLN
20.0000 mg | Freq: Once | INTRAMUSCULAR | Status: AC
  Administered 2015-12-19: 20 mg via INTRAVENOUS
  Filled 2015-12-19: qty 2

## 2015-12-19 MED ORDER — DEXTROSE 50 % IV SOLN
25.0000 mL | Freq: Once | INTRAVENOUS | Status: AC
  Administered 2015-12-19: 25 mL via INTRAVENOUS

## 2015-12-19 MED ORDER — DEXTROSE 50 % IV SOLN
25.0000 mL | Freq: Once | INTRAVENOUS | Status: AC
  Administered 2015-12-19: 25 mL via INTRAVENOUS
  Filled 2015-12-19: qty 50

## 2015-12-19 NOTE — Progress Notes (Signed)
Patient due to have Lantus 15u at this time.  Has had 2 hypoglycemic episodes in the past 24 hours, has not required any sliding scale coverage in greater than 24 hours, and has been NPO for greater than 24 hours.  MD on call notified by text page, will await response and continue to monitor.  Lantus held at this time until further instructions are received.

## 2015-12-19 NOTE — Progress Notes (Addendum)
Patient ID: Louis Acosta, male   DOB: Jun 15, 1931, 82 y.o.   MRN: [id number]  PROGRESS NOTE    DERRYCK SHAHAN  [facility]:[id number] DOB: Feb 21, 1932 DOA: 11/24/2015  PCP: [name], MD   Brief Narrative:   82 y.o. male with medical history significant for hypertension, chronic combined CHF, has pacemaker, DM on insulin, dyslipidemia, CKD stage 3 with baseline Cr 1.9 in 05/2015. He comes from home and his son at the bedside provided most of the history . Pt was in his usual state of health, at baseline able to walk with the walker and cane but over past day PTA he was not able to get up without the assistance. He could not ambulate without the assistance and he almost fell. He was also more short of breath especially when trying to get up. No chest pain, no palpitations.  In ED, BP is 73/58, HR 108, RR 21-29, T max 100.2 F, oxygen saturation 73% on room air but has gotten to 93% with 3-4 L [place] oxygen support. Blood work showed leukocytosis of 22.7, hemoglobin 11.5, platelets 148, Cr 1.86, troponin 0.1, lactic acid 2.7. CXR showed volume loss and basilar opacity in the left hemithorax suspicious for infiltrate. He has received 2 L fluids on admission.   Pt more hypoxic 9/30, O2 sat's in mid 80's on Ventimask. CCM has seen the pt in consultation.    Assessment & Plan:   Principal Problem:   Acute respiratory failure with hypoxia (HCC)  - On VM this am - CXR 9/30 showed stable dense opacity at the left base and hazy airspace disease at the right base - Continue current nebs - Monitor in SDU for next 24 hours as pt still requires VM and is more confused this am  Active Problems:   Sepsis due to pneumonia ([place]) / Lobar pneumonia, unspecified organism ([place]) / Leukocytosis  - Sepsis criteria met on admission with fever, tachycardia, tachypnea, hypoxia, hypotension, leukocytosis, lactic acidosis and CXR with evidence of left lung pneumonia - Continue cefepime; vanco stopped 9/28 - Blood  cultures negative so far    Acute on chronic systolic and diastolic CHF (congestive heart failure) ([place]) / CARDIAC PACEMAKER-St.Jude - Compensated    Essential hypertension, benign - BP at goal 111/71    CKD (chronic kidney disease) stage 3, GFR 30-59 ml/min - Cr at baseline 1.9 in 05/2015 - Cr is within baseline range     Anemia of chronic kidney disease - Stable hemoglobin     Controlled diabetes mellitus with diabetic nephropathy, with long-term current use of insulin (HCC) - Continue Lantus 15 units at bedtime and add SSI - CBG's in past 24 hours: 61, 88, 79    Diabetic neuropathy - Continue gabapentin    Hypothyroidism - Continue Synthroid    Dyslipidemia associated with type 2 diabetes mellitus (HCC) - Continue simvastatin 40 mg at bedtime and omega 3 supplementation     Elevated troponin - Due to demand ischemia from sepsis, acute decompensated CHF - No chest pain  - Troponin level 0.20 --> 0.18 --> 0.16 - 2 D ECHO - EF 15%, diffuse hypokinesis     DVT prophylaxis: SCD's bilaterally  Code Status: DNR/DNI  Family Communication: no famiy at the bedside this am; updated patient's daughter over the phone 9/30 Disposition Plan: remains in SDU due to high oxygen requirements    Consultants:   Cardiology   WOC  PCCM  Procedures:   2 D ECHO - EF 15%, diffuse hypokinesis  Antimicrobials:   Cefepime 12/09/2015 -->   Vanco 9/25 --> 9/28   Subjective: No overnight events.  Objective: Vitals:   12/19/15 1000 12/19/15 1200 12/19/15 1400 12/19/15 1600  BP: 103/70 100/74 104/67 111/71  Pulse: (!) 30 (!) 104 97 (!) 109  Resp: (!) 30 (!) 23 (!) 26 15  Temp:  97.4 F (36.3 C)  98.1 F (36.7 C)  TempSrc:  Axillary  Axillary  SpO2: 98% 94% 92% 98%  Weight:      Height:        Intake/Output Summary (Last 24 hours) at 12/19/15 1936 Last data filed at 12/19/15 1702  Gross per 24 hour  Intake              325 ml  Output              725 ml    Net             -400 ml   Filed Weights   12/18/15 0430 12/18/15 1015 12/19/15 0500  Weight: 91.1 kg (200 lb 13.4 oz) 94.4 kg (208 lb 1.8 oz) 90.5 kg (199 lb 8.3 oz)    Examination:  General exam: on Venti mask, no acute distress  Respiratory system: Coarse breath sounds, no wheezing Cardiovascular system: S1 & S2 heard, tachycardic  Gastrointestinal system: (+) BS, non tender, not distended  Central nervous system: No focal deficits, pt confused, disoriented  Extremities: palpable pulses, no tenderness  Skin: warm, dry Psychiatry: Not agitated, not restless   Data Reviewed: I have personally reviewed following labs and imaging studies  CBC:  Recent Labs Lab 12/14/2015 1604 12/14/15 0111 12/15/15 0347 12/16/15 0343 12/18/15 1125 12/19/15 0339  WBC 22.7* 17.5* 12.8* 10.8* 9.0 8.7  NEUTROABS 18.8*  --   --   --   --   --   HGB 11.5* 10.9* 11.3* 11.5* 12.6* 12.0*  HCT 35.2* 34.0* 35.9* 36.2* 39.7 36.5*  MCV 99.7 100.3* 102.6* 99.2 101.8* 98.6  PLT 148* 132* 148* 153 140* 644*   Basic Metabolic Panel:  Recent Labs Lab 11/21/2015 1946  12/16/15 0343 12/17/15 0538 12/18/15 0515 12/18/15 1125 12/19/15 0339  NA  --   < > 134* 133* 134* 135 136  K  --   < > 4.7 5.0 5.8* 5.6* 5.3*  CL  --   < > 95* 97* 97* 96* 98*  CO2  --   < > 32 29 29 32 31  GLUCOSE  --   < > 122* 127* 132* 115* 76  BUN  --   < > 56* 52* 59* 62* 64*  CREATININE  --   < > 1.32* 1.13 1.42* 1.39* 1.27*  CALCIUM  --   < > 9.4 9.3 9.5 9.7 9.7  MG 2.1  --   --   --   --   --   --   PHOS 3.8  --   --   --   --   --   --   < > = values in this interval not displayed. GFR: Estimated Creatinine Clearance: 51.6 mL/min (by C-G formula based on SCr of 1.27 mg/dL (H)). Liver Function Tests: No results for input(s): AST, ALT, ALKPHOS, BILITOT, PROT, ALBUMIN in the last 168 hours. No results for input(s): LIPASE, AMYLASE in the last 168 hours. No results for input(s): AMMONIA in the last 168 hours. Coagulation  Profile: No results for input(s): INR, PROTIME in the last 168 hours. Cardiac Enzymes:  Recent Labs  Lab 11/26/2015 1617 11/22/2015 1946 12/14/15 0111 12/14/15 0801  TROPONINI 0.18* 0.20* 0.18* 0.16*   BNP (last 3 results) No results for input(s): PROBNP in the last 8760 hours. HbA1C: No results for input(s): HGBA1C in the last 72 hours. CBG:  Recent Labs Lab 12/19/15 0726 12/19/15 1121 12/19/15 1537 12/19/15 1630 12/19/15 1851  GLUCAP 75 76 61* 88 79   Lipid Profile: No results for input(s): CHOL, HDL, LDLCALC, TRIG, CHOLHDL, LDLDIRECT in the last 72 hours. Thyroid Function Tests:  Recent Labs  12/18/15 1125  TSH 5.675*  FREET4 0.80   Anemia Panel: No results for input(s): VITAMINB12, FOLATE, FERRITIN, TIBC, IRON, RETICCTPCT in the last 72 hours. Urine analysis:    Component Value Date/Time   COLORURINE YELLOW 10/23/2012 Page 10/23/2012 0955   LABSPEC 1.013 10/23/2012 0955   PHURINE 7.0 10/23/2012 0955   GLUCOSEU NEGATIVE 10/23/2012 0955   HGBUR NEGATIVE 10/23/2012 0955   BILIRUBINUR NEGATIVE 10/23/2012 0955   KETONESUR NEGATIVE 10/23/2012 0955   PROTEINUR NEGATIVE 10/23/2012 0955   UROBILINOGEN 1.0 10/23/2012 0955   NITRITE NEGATIVE 10/23/2012 0955   LEUKOCYTESUR NEGATIVE 10/23/2012 0955   Sepsis Labs: '@[id number]'$ (procalcitonin:4,lacticidven:4)    Blood Culture (routine x 2)     Status: None (Preliminary result)   Collection Time: 12/07/2015  4:00 PM  Result Value Ref Range Status   Specimen Description   Final    BLOOD LEFT FOREARM Performed at [facility]    Special Requests IN PEDIATRIC BOTTLE  2 CC  Final   Culture PENDING  Incomplete   Report Status PENDING  Incomplete  MRSA PCR Screening     Status: None   Collection Time: 11/25/2015  6:25 PM  Result Value Ref Range Status   MRSA by PCR NEGATIVE NEGATIVE Final      Radiology Studies: Dg Chest Port 1 View Result Date: 12/04/2015 1. Progressive volume loss and  basilar opacity in the left hemithorax suspicious for atelectasis or infiltrate. Probable adjacent small left pleural effusion. Followup PA and lateral chest X-ray is recommended in 3-4 weeks (following trial of antibiotic therapy if clinically warranted) to ensure resolution and exclude underlying malignancy. 2. Cardiomegaly with chronic vascular congestion.      Scheduled Meds: . ceFEPime (MAXIPIME) IV  1 g Intravenous Q12H  . fluticasone  2 spray Each Nare Daily  . insulin aspart  0-9 Units Subcutaneous Q4H  . insulin glargine  15 Units Subcutaneous QHS  . ipratropium  0.5 mg Nebulization BID  . levalbuterol  1.25 mg Nebulization BID  . mouth rinse  15 mL Mouth Rinse BID  . polyvinyl alcohol  1 drop Left Eye Daily  . prednisoLONE acetate  1 drop Right Eye QHS  . sodium chloride  1,000 mL Intravenous Once   And  . sodium chloride  1,000 mL Intravenous Once   Continuous Infusions: . sodium chloride 10 mL/hr at 12/15/15 0000     LOS: 6 days    Time spent: 25 minutes  Greater than 50% of the time spent on counseling and coordinating the care.   Leisa Lenz, MD [facility] Pager [phone]  If 7PM-7AM, please contact night-coverage [email or web address] Password [email or web address] 12/19/2015, 7:36 PM

## 2015-12-19 NOTE — Progress Notes (Signed)
PULMONARY / CRITICAL CARE MEDICINE   Name: Louis Acosta MRN: [id number] DOB: [birth year]    ADMISSION DATE:  12/03/2015 CONSULTATION DATE:  12/18/2015  REFERRING MD:  Leisa Lenz, M.D. / [facility]  CHIEF COMPLAINT:  Acute Hypoxic Respiratory Failure  HISTORY OF PRESENT ILLNESS:  82 y.o. male with known history of chronic congestive heart failure status post pacemaker placement along with diabetes, hypertension, and chronic renal failure stage III. At baseline the patient is able to walk with a cane and walker but in the day prior to arrival patient was having difficulty getting up without assistance. Patient indeed almost fell per electronic medical record. Patient was also noted to have dyspnea when attempting to get up as well. Per electronic medical record patient denied any chest pain or palpitations. In the emergency department he was noted to have hypotension with a systolic blood pressure of 73 and was saturating 73% on room air. The patient required 3-4 L/m of oxygen via nasal cannula to achieve a saturation of 93%. With patient's leukocytosis on arrival and basilar opacities. He was started on broad-spectrum antibiotic coverage. He was also administered 2 L of IV fluid on admission. On the morning of transfer to the stepdown unit patient was noted to have a saturation in the mid 80s on Ventimask. PCCM was consulted to assist in evaluation of the patient's hypoxia. Patient appears to have increased oxygen requirements up to 7 L/m via nasal cannula since 9/28. Patient is altered and unable to provide further history or review of systems.  SUBJECTIVE: No acute events overnight. Patient reports he continues to have an intermittent cough but dyspnea improving. Denies any chest pain, tightness, or pressure.   REVIEW OF SYSTEMS:  Denies any subjective fever or chills. No abdominal pain or nausea. No headache or vision changes.  VITAL SIGNS: BP 107/80   Pulse 96   Temp 97.4 F (36.3 C) (Axillary)    Resp 16   Ht 5\' 11"  (1.803 m)   Wt 199 lb 8.3 oz (90.5 kg)   SpO2 97%   BMI 27.83 kg/m   HEMODYNAMICS:    VENTILATOR SETTINGS: FiO2 (%):  [55 %] 55 %  INTAKE / OUTPUT: I/O last 3 completed shifts: In: 280 [I.V.:110; Other:20; IV Piggyback:150] Out: [id number]  PHYSICAL EXAMINATION: General:  Eyes closed until I walked into the room. Now awake & alert. No distress. Integument:  Warm & dry. No rash on exposed skin.  Lymphatics:  No appreciated cervical or supraclavicular lymphadenoapthy. HEENT:  Moist mucus membranes. No scleral icterus. Venti mask in place. Cardiovascular:  Regular rhythm. Improving bilateral upper extremity edema. No appreciable JVD.  Pulmonary:  Improving aeration bilaterally. No accessory muscle use on venti mask. Abdomen: Soft. Normal bowel sounds. Protuberant. Nontender.  Musculoskeletal:  Normal bulk and tone. No joint deformity or effusion appreciated. Neurological:  Oriented x4. Grossly nonfocal. No meningismus.  LABS:  BMET  Recent Labs Lab 12/18/15 0515 12/18/15 1125 12/19/15 0339  NA 134* 135 136  K 5.8* 5.6* 5.3*  CL 97* 96* 98*  CO2 29 32 31  BUN 59* 62* 64*  CREATININE 1.42* 1.39* 1.27*  GLUCOSE 132* 115* 76    Electrolytes  Recent Labs Lab 12/06/2015 1946  12/18/15 0515 12/18/15 1125 12/19/15 0339  CALCIUM  --   < > 9.5 9.7 9.7  MG 2.1  --   --   --   --   PHOS 3.8  --   --   --   --   < > =  values in this interval not displayed.  CBC  Recent Labs Lab 12/16/15 0343 12/18/15 1125 12/19/15 0339  WBC 10.8* 9.0 8.7  HGB 11.5* 12.6* 12.0*  HCT 36.2* 39.7 36.5*  PLT 153 140* 139*    Coag's No results for input(s): APTT, INR in the last 168 hours.  Sepsis Markers  Recent Labs Lab 12/06/2015 1617 11/19/2015 1627  LATICACIDVEN  --  2.70*  PROCALCITON 1.48  --     ABG  Recent Labs Lab 12/18/15 0848  PHART 7.338*  PCO2ART 56.5*  PO2ART 96.5    Liver Enzymes No results for input(s): AST, ALT,  ALKPHOS, BILITOT, ALBUMIN in the last 168 hours.  Cardiac Enzymes  Recent Labs Lab 11/21/2015 1946 12/14/15 0111 12/14/15 0801  TROPONINI 0.20* 0.18* 0.16*    Glucose  Recent Labs Lab 12/18/15 1535 12/18/15 1937 12/19/15 0029 12/19/15 0345 12/19/15 0422 12/19/15 0726  GLUCAP 116* 96 98 69 130* 75    Imaging No results found.   STUDIES:  TTE 9/26: LV severely dilated with EF 15% and diffuse hypokinesis. No apical thrombus noted. Grade 1 diastolic dysfunction. LA moderately dilated & RA mild to moderately dilated. RV normal in size with moderately reduced systolic function. Pulmonary artery systolic pressure 56 mmHg. No aortic stenosis or regurgitation. Aortic root normal in size. Mild mitral regurgitation without stenosis. No pulmonic regurgitation. Moderate tricuspid regurgitation. Unclear if there is a pericardial effusion. IVC 2.5 cm with less than 50% respiratory phase variation.  PORT CXR 9/30:  Previously reviewed by me. Suggestion of cardiomegaly with vascular congestion. Questionable left basilar opacity relatively unchanged. Slight worsening of right hemidiaphragm silhouetting. Pacer wires/AICD wires in place.  MICROBIOLOGY: MRSA PCR 9/25:  Negative Blood Ctx x2 9/25:  Negative MRSA PCR 9/30:  Negative   ANTIBIOTICS: Vancomycin 9/25 x1 dose Cefepime 9/25 >>  SIGNIFICANT EVENTS: 9/25 - Admit w/ hypoxia requiring BiPAP 9/30 - Transferred back to SDU w/ worsening in hypoxia  LINES/TUBES: PIV  DISCUSSION:  82 y.o. male with severe CAP on presentation & hypotension that responded to antibiotic therapy and IV fluid bolus. Patient responding to diuresis. Improving mental status likely due to hypoxia and some element of delirium. I will defer to primary service in restarting a diet. He can likely finish out his Cefepime today and this discontinue further antibiotic medication.  ASSESSMENT / PLAN:  PULMONARY A: Acute Hypoxic Respiratory Failure -  Improving. Chronic Hypercarbic Respiratory Failure  P:   Weaning FiO2 for Sat 88-94% to minimize VQ mismatching Continuing Xopenex & Atrovent nebs q4hr Continuous Pulse Oximetry Lasix 20mg  IV x1  CARDIOVASCULAR A:  Shock - Resolved w/ IVF in ED on arrival. Combined Systolic & Diastolic CHF - EF 0000000 on [id number] TTE. Weight is up 10 pounds since admission.  H/O HTN & Dyslipidemia S/P AICD/Pacemaker Placement  P:  Continuous telemetry monitoring Vitals per unit protocol Holding Lovaza & Zocor  RENAL A:   Acute on Chronic Renal Failure Stage 3 - Improving. Hyperkalemia - Mild & improving. H/O Enlarged Prostate  P:   Monitoring UOP Trending electrolytes & renal function daily w/ diuresis  GASTROINTESTINAL A:   H/O Hiatal Hernia  P:   NPO until ok with primary service  HEMATOLOGIC A:   Leukocytosis - Resolved. Likely due to sepsis. Thrombocytopenia - Mild. Likely due to sepsis. Anemia - Mild w/ stable Hgb. No signs of active bleeding.  P:  SCDs  INFECTIOUS A:   Sepsis Possible Severe CAP  P:   Currently on Day #7  of Empiric Cefepime Can likely discontinue antibiotic after today  ENDOCRINE A:   H/O DM Hypothyroidism - TSH 5.675 but FT4 0.80.    P:   Lantus 15u [place] qhs Accu-Checks q4hr while NPO SSI per Sensitive Algorithm Holding Synthroid daily  NEUROLOGIC A:   Acute Encephalopathy - Multifactorial with likely a strong component of delirium. Improved significantly. Memory Loss  P:   Holding Neurontin Recommend avoiding sedating medications   FAMILY  - Updates:  Wife Enid Derry updated by Dr. Ashok Cordia via phone & daughter Lattie Haw updated via phone on 9/30. Wife defers to daughter in matters relating to the care of the patient. Daughter reports father would not want resuscitation and agrees to DNR/DNI.  - Inter-disciplinary family meet or Palliative Care meeting due by:  10/7  PCCM signing off at this time. Please re-consult if we can be of any further  asistance.    Sonia Baller Ashok Cordia, M.D. [facility] Pulmonary [facility] Critical [facility] Pager:  [phone] After 3pm or if no response, call [phone] 12/19/2015, 9:41 AM

## 2015-12-19 DEATH — deceased

## 2015-12-20 LAB — RENAL FUNCTION PANEL
ALBUMIN: 3.4 g/dL — AB (ref 3.5–5.0)
Anion gap: 11 (ref 5–15)
BUN: 60 mg/dL — AB (ref 6–20)
CALCIUM: 9.7 mg/dL (ref 8.9–10.3)
CO2: 28 mmol/L (ref 22–32)
Chloride: 102 mmol/L (ref 101–111)
Creatinine, Ser: 1.23 mg/dL (ref 0.61–1.24)
GFR calc Af Amer: >60 mL/min (ref >60)
GFR calc non Af Amer: 53 mL/min — ABNORMAL LOW (ref >60)
GLUCOSE: 91 mg/dL (ref 65–99)
PHOSPHORUS: 3.5 mg/dL (ref 2.5–4.6)
Potassium: 5.1 mmol/L (ref 3.5–5.1)
SODIUM: 141 mmol/L (ref 135–145)

## 2015-12-20 LAB — CBC WITH DIFFERENTIAL/PLATELET
BASOS PCT: 0 %
Basophils Absolute: 0 10*3/uL (ref 0.0–0.1)
EOS ABS: 0.1 10*3/uL (ref 0.0–0.7)
Eosinophils Relative: 1 %
HEMATOCRIT: 38.9 % — AB (ref 39.0–52.0)
HEMOGLOBIN: 12.2 g/dL — AB (ref 13.0–17.0)
LYMPHS ABS: 1 10*3/uL (ref 0.7–4.0)
Lymphocytes Relative: 10 %
MCH: 31.9 pg (ref 26.0–34.0)
MCHC: 31.4 g/dL (ref 30.0–36.0)
MCV: 101.6 fL — ABNORMAL HIGH (ref 78.0–100.0)
MONO ABS: 0.8 10*3/uL (ref 0.1–1.0)
MONOS PCT: 8 %
Neutro Abs: 7.6 10*3/uL (ref 1.7–7.7)
Neutrophils Relative %: 81 %
Platelets: 158 10*3/uL (ref 150–400)
RBC: 3.83 MIL/uL — ABNORMAL LOW (ref 4.22–5.81)
RDW: 15.3 % (ref 11.5–15.5)
WBC: 9.4 10*3/uL (ref 4.0–10.5)

## 2015-12-20 LAB — GLUCOSE, CAPILLARY
GLUCOSE-CAPILLARY: 80 mg/dL (ref 65–99)
GLUCOSE-CAPILLARY: 128 mg/dL — AB (ref 65–99)
GLUCOSE-CAPILLARY: 140 mg/dL — AB (ref 65–99)
Glucose-Capillary: 128 mg/dL — ABNORMAL HIGH (ref 65–99)
Glucose-Capillary: 67 mg/dL (ref 65–99)
Glucose-Capillary: 93 mg/dL (ref 65–99)
Glucose-Capillary: 93 mg/dL (ref 65–99)

## 2015-12-20 LAB — MAGNESIUM: Magnesium: 2.2 mg/dL (ref 1.7–2.4)

## 2015-12-20 MED ORDER — DEXTROSE 50 % IV SOLN
INTRAVENOUS | Status: AC
  Filled 2015-12-20: qty 50

## 2015-12-20 MED ORDER — GUAIFENESIN-DM 100-10 MG/5ML PO SYRP
5.0000 mL | ORAL_SOLUTION | ORAL | Status: DC | PRN
Start: 2015-12-20 — End: 2015-12-21
  Administered 2015-12-20 (×2): 5 mL via ORAL
  Filled 2015-12-20 (×2): qty 10

## 2015-12-20 MED ORDER — DEXTROSE 50 % IV SOLN
25.0000 mL | Freq: Once | INTRAVENOUS | Status: AC
  Administered 2015-12-20: 25 mL via INTRAVENOUS

## 2015-12-20 MED ORDER — RESOURCE THICKENUP CLEAR PO POWD
ORAL | Status: DC | PRN
  Filled 2015-12-20: qty 125

## 2015-12-20 NOTE — Progress Notes (Signed)
Hypoglycemic Event  CBG: 67  Treatment: D50 10mL  Symptoms: asymptomatic   Follow-up CBG: Time: 0130 CBG Result: 128  Possible Reasons for Event: NPO>24hr, continuation of Lantus SQ  Comments/MD notified: text paged MD    Karren Burly

## 2015-12-20 NOTE — Progress Notes (Signed)
Patient ID: Louis Acosta, male   DOB: 08-03-1931, 82 y.o.   MRN: [id number]  PROGRESS NOTE    Louis Acosta  [id number] DOB: 1931-05-10 DOA: 11/29/2015  PCP: [name], MD   Brief Narrative:   82 y.o. male with medical history significant for hypertension, chronic combined CHF, has pacemaker, DM on insulin, dyslipidemia, CKD stage 3 with baseline Cr 1.9 in 05/2015. He comes from home and his son at the bedside provided most of the history . Pt was in his usual state of health, at baseline able to walk with the walker and cane but over past day PTA he was not able to get up without the assistance. He could not ambulate without the assistance and he almost fell. He was also more short of breath especially when trying to get up. No chest pain, no palpitations.  In ED, BP is 73/58, HR 108, RR 21-29, T max 100.2 F, oxygen saturation 73% on room air but has gotten to 93% with 3-4 L [place] oxygen support. Blood work showed leukocytosis of 22.7, hemoglobin 11.5, platelets 148, Cr 1.86, troponin 0.1, lactic acid 2.7. CXR showed volume loss and basilar opacity in the left hemithorax suspicious for infiltrate. He has received 2 L fluids on admission.   Pt more hypoxic 9/30, O2 sat's in mid 80's on Ventimask. CCM has seen the pt in consultation.    Assessment & Plan:   Principal Problem:   Acute respiratory failure with hypoxia ([place])  - Looks better this morning, stable respiratory status - CXR 9/30 showed stable dense opacity at the left base and hazy airspace disease at the right base - Continue current nebs - Continue to monitor in step down unit. Patient is on 10 L oxygen via nasal cannula and his oxygen saturation is 91%.  Active Problems:   Sepsis due to pneumonia [facility]) / Lobar pneumonia, unspecified organism ([place]) / Leukocytosis  - Sepsis criteria met on admission with fever, tachycardia, tachypnea, hypoxia, hypotension, leukocytosis, lactic acidosis and CXR with evidence of  left lung pneumonia - Continue cefepime; vanco stopped 9/28 - Blood cultures negative so far    Acute on chronic systolic and diastolic CHF (congestive heart failure) ([place]) / CARDIAC PACEMAKER-[employer].Jude - Compensated    Essential hypertension, benign - Continue to monitor on telemetry - Blood pressure 113/63    CKD (chronic kidney disease) stage 3, GFR 30-59 ml/min - Cr at baseline 1.9 in 05/2015 - Cr is within baseline range, within normal limits this morning    Anemia of chronic kidney disease - Stable hemoglobin     Controlled diabetes mellitus with diabetic nephropathy, with long-term current use of insulin (HCC) - Continue sliding scale insulin - CBG's in past 24 hours: 93, 93, 80    Diabetic neuropathy - Continue gabapentin    Hypothyroidism - Continue Synthroid    Dyslipidemia associated with type 2 diabetes mellitus (HCC) - Continue simvastatin 40 mg at bedtime and omega 3 supplementation     Elevated troponin - Due to demand ischemia from sepsis, acute decompensated CHF - No chest pain  - Troponin level 0.20 --> 0.18 --> 0.16 - 2 D ECHO - EF 15%, diffuse hypokinesis     DVT prophylaxis: SCD's bilaterally  Code Status: DNR/DNI  Family Communication: Daughter at the bedside Disposition Plan: remains in SDU due to high oxygen requirements    Consultants:   Cardiology   WOC  PCCM  Procedures:   2 D ECHO - EF 15%, diffuse hypokinesis  Antimicrobials:   Cefepime 12/10/2015 -->   Vanco 9/25 --> 9/28   Subjective: No overnight events.  Objective: Vitals:   12/20/15 1111 12/20/15 1156 12/20/15 1200 12/20/15 1207  BP:   113/63   Pulse: (!) 104  (!) 104 (!) 105  Resp: (!) 28  (!) 26 (!) 29  Temp:  99 F (37.2 C)    TempSrc:  Axillary    SpO2: 97%  94% 99%  Weight:      Height:        Intake/Output Summary (Last 24 hours) at 12/20/15 1232 Last data filed at 12/20/15 1200  Gross per 24 hour  Intake              240 ml  Output               100 ml  Net              140 ml   Filed Weights   12/18/15 1015 12/19/15 0500 12/20/15 0451  Weight: 94.4 kg (208 lb 1.8 oz) 90.5 kg (199 lb 8.3 oz) 90.4 kg (199 lb 4.7 oz)    Examination:  General exam: No distress Respiratory system: Coarse breath sounds, no wheezing Cardiovascular system: S1 & S2 heard, tachycardic  Gastrointestinal system: (+) BS, nontender and not distended Central nervous system: Nonfocal, remains disoriented Extremities: palpable pulses, no tenderness  Skin: No lesions or ulcers Psychiatry: Not agitated, not restless   Data Reviewed: I have personally reviewed following labs and imaging studies  CBC:  Recent Labs Lab 12/15/2015 1604  12/15/15 0347 12/16/15 0343 12/18/15 1125 12/19/15 0339 12/20/15 0330  WBC 22.7*  < > 12.8* 10.8* 9.0 8.7 9.4  NEUTROABS 18.8*  --   --   --   --   --  7.6  HGB 11.5*  < > 11.3* 11.5* 12.6* 12.0* 12.2*  HCT 35.2*  < > 35.9* 36.2* 39.7 36.5* 38.9*  MCV 99.7  < > 102.6* 99.2 101.8* 98.6 101.6*  PLT 148*  < > 148* 153 140* 139* 158  < > = values in this interval not displayed. Basic Metabolic Panel:  Recent Labs Lab 11/30/2015 1946  12/17/15 0538 12/18/15 0515 12/18/15 1125 12/19/15 0339 12/20/15 0330  NA  --   < > 133* 134* 135 136 141  K  --   < > 5.0 5.8* 5.6* 5.3* 5.1  CL  --   < > 97* 97* 96* 98* 102  CO2  --   < > 29 29 32 31 28  GLUCOSE  --   < > 127* 132* 115* 76 91  BUN  --   < > 52* 59* 62* 64* 60*  CREATININE  --   < > 1.13 1.42* 1.39* 1.27* 1.23  CALCIUM  --   < > 9.3 9.5 9.7 9.7 9.7  MG 2.1  --   --   --   --   --  2.2  PHOS 3.8  --   --   --   --   --  3.5  < > = values in this interval not displayed. GFR: Estimated Creatinine Clearance: 53.2 mL/min (by C-G formula based on SCr of 1.23 mg/dL). Liver Function Tests:  Recent Labs Lab 12/20/15 0330  ALBUMIN 3.4*   No results for input(s): LIPASE, AMYLASE in the last 168 hours. No results for input(s): AMMONIA in the last 168  hours. Coagulation Profile: No results for input(s): INR, PROTIME in the last 168 hours.  Cardiac Enzymes:  Recent Labs Lab 12/04/2015 1617 11/22/2015 1946 12/14/15 0111 12/14/15 0801  TROPONINI 0.18* 0.20* 0.18* 0.16*   BNP (last 3 results) No results for input(s): PROBNP in the last 8760 hours. HbA1C: No results for input(s): HGBA1C in the last 72 hours. CBG:  Recent Labs Lab 12/20/15 0046 12/20/15 0131 12/20/15 0424 12/20/15 0729 12/20/15 1128  GLUCAP 67 128* 93 93 80   Lipid Profile: No results for input(s): CHOL, HDL, LDLCALC, TRIG, CHOLHDL, LDLDIRECT in the last 72 hours. Thyroid Function Tests:  Recent Labs  12/18/15 1125  TSH 5.675*  FREET4 0.80   Anemia Panel: No results for input(s): VITAMINB12, FOLATE, FERRITIN, TIBC, IRON, RETICCTPCT in the last 72 hours. Urine analysis:    Component Value Date/Time   COLORURINE YELLOW 10/23/2012 [place] 10/23/2012 0955   LABSPEC 1.013 10/23/2012 0955   PHURINE 7.0 10/23/2012 0955   GLUCOSEU NEGATIVE 10/23/2012 0955   HGBUR NEGATIVE 10/23/2012 0955   BILIRUBINUR NEGATIVE 10/23/2012 0955   KETONESUR NEGATIVE 10/23/2012 0955   PROTEINUR NEGATIVE 10/23/2012 0955   UROBILINOGEN 1.0 10/23/2012 0955   NITRITE NEGATIVE 10/23/2012 0955   LEUKOCYTESUR NEGATIVE 10/23/2012 0955   Sepsis Labs: '@LABRCNTIP'$ (procalcitonin:4,lacticidven:4)    Blood Culture (routine x 2)     Status: None (Preliminary result)   Collection Time: 12/11/2015  4:00 PM  Result Value Ref Range Status   Specimen Description   Final    BLOOD LEFT FOREARM Performed at [facility]    Special Requests IN PEDIATRIC BOTTLE  2 CC  Final   Culture PENDING  Incomplete   Report Status PENDING  Incomplete  MRSA PCR Screening     Status: None   Collection Time: 12/15/2015  6:25 PM  Result Value Ref Range Status   MRSA by PCR NEGATIVE NEGATIVE Final      Radiology Studies: Dg Chest Port 1 View Result Date: 11/19/2015 1.  Progressive volume loss and basilar opacity in the left hemithorax suspicious for atelectasis or infiltrate. Probable adjacent small left pleural effusion. Followup PA and lateral chest X-ray is recommended in 3-4 weeks (following trial of antibiotic therapy if clinically warranted) to ensure resolution and exclude underlying malignancy. 2. Cardiomegaly with chronic vascular congestion.      Scheduled Meds: . ceFEPime (MAXIPIME) IV  1 g Intravenous Q12H  . dextrose      . fluticasone  2 spray Each Nare Daily  . insulin aspart  0-9 Units Subcutaneous Q4H  . ipratropium  0.5 mg Nebulization BID  . levalbuterol  1.25 mg Nebulization BID  . mouth rinse  15 mL Mouth Rinse BID  . polyvinyl alcohol  1 drop Left Eye Daily  . prednisoLONE acetate  1 drop Right Eye QHS  . sodium chloride  1,000 mL Intravenous Once   And  . sodium chloride  1,000 mL Intravenous Once   Continuous Infusions: . sodium chloride 10 mL/hr at 12/20/15 1200     LOS: 7 days    Time spent: 25 minutes  Greater than 50% of the time spent on counseling and coordinating the care.   Leisa Lenz, MD [facility] Pager [phone]  If 7PM-7AM, please contact night-coverage [email or web address] Password [email or web address] 12/20/2015, 12:32 PM

## 2015-12-20 NOTE — Evaluation (Signed)
Clinical/Bedside Swallow Evaluation Patient Details  Name: Louis Acosta MRN: [id number] Date of Birth: 06-12-1931  Today's Date: 12/20/2015 Time: SLP Start Time (ACUTE ONLY): 1300 SLP Stop Time (ACUTE ONLY): 1348 SLP Time Calculation (min) (ACUTE ONLY): 48 min  Past Medical History:  Past Medical History:  Diagnosis Date  . Anginal pain ([place])    within the last 2 wks has taken Ntg 1 time  . Arthritis   . Cardiomyopathy    broken 3x's  . CHF (congestive heart failure) (HCC)    takes Furosemide daily  . Chronic back pain   . Confusion    short term memory loss  . Diabetes mellitus    type 2;takes Lantus nightly and Humalog prn  . Diverticulosis   . Dysrhythmia    HX OF COMPLETE HEART BLOCK;takes Metoprolol daily  . Enlarged prostate    takes [employer]  . Glaucoma    uses several drops  . H/O hiatal hernia   . History of colon polyps   . History of fracture of nose   . History of shingles   . History of staph infection    50+yrs ago  . Hyperlipidemia    takes Simvastatin daily  . Hypertension    takes Losartan daily  . Joint pain   . Nocturia   . Pacemaker    [employer] Jude  . PONV (postoperative nausea and vomiting)   . Seasonal allergies    uses Flonase prn and takes Claritin prn  . Shortness of breath    lying and with exertion  . Skin spots, red    Past Surgical History:  Past Surgical History:  Procedure Laterality Date  . BACK SURGERY     x 2  . bilateral cataract surgery    . COLONOSCOPY    . EPICARDIAL PACING LEAD PLACEMENT N/A 10/24/2012   Procedure: EPICARDIAL PACING LEAD PLACEMENT;  Surgeon: Gaye Pollack, MD;  Location: [facility] OR;  Service: Thoracic;  Laterality: N/A;  LV EPICARDIAL LEADS  . ESOPHAGOGASTRODUODENOSCOPY    . INSERT / REPLACE / REMOVE PACEMAKER     x 3  . JOINT REPLACEMENT     right knee arthroplasty  . LEAD REVISION N/A 09/30/2012   Procedure: LEAD REVISION;  Surgeon: Evans Lance, MD;  Location: [facility] CATH LAB;  Service:  Cardiovascular;  Laterality: N/A;  . PACEMAKER LEAD REMOVAL N/A 08/21/2012   Procedure: PACEMAKER LEAD REMOVAL;  Surgeon: Evans Lance, MD;  Location: [place];  Service: Cardiovascular;  Laterality: N/A;  . right eye lasik    . THORACOTOMY Left 10/24/2012   Procedure: THORACOTOMY MAJOR;  Surgeon: Gaye Pollack, MD;  Location: [facility] OR;  Service: Thoracic;  Laterality: Left;  . TRANSTHORACIC ECHOCARDIOGRAM  01/2007   HPI:  pt is an 82 yo male adm to [facility] with respiratory difficulties.  Pt found to have pna at left and right base,  PMH for hiatal hernia, cardiac issues.   Pt admits to weight loss but son states it is intentional.  Son Psychologist, counselling) does not recall when/if pt had a prior pna. Son does state that dad sleeps approximately 80% of the day for many years?   Assessment / Plan / Recommendation Clinical Impression  Pt presents with suspected acute on chronic dysphagia ( has h/o nonspecific esophageal dysmotility per UGI 2010).  Mental status/lethargy impacting swallowing at this time resulting in pt orally holding boluses.   Pt also undergoing multiple respiratory cycles before swallowing.   Suspect overt aspiration  of thin via cup characterized by immediate post=swallow coughing.  Pt tolerated softer, puree, nectar and single small ice chips better without indication of aspiration.  No indication of pharyngeal residuals present also.  Recommend dys2/nectar diet to compensate for pt's mental status/dysphagia.  Using teach back, educated son and pt to findings.  Hopeful for dietary advancement with improved mental/medical status.      Aspiration Risk  Moderate aspiration risk    Diet Recommendation Dysphagia 2 (Fine chop);Nectar-thick liquid;Ice chips PRN after oral care   Liquid Administration via: Spoon;No straw Medication Administration: Whole meds with puree Supervision: Staff to assist with self feeding Compensations: Slow rate;Small sips/bites Postural Changes: Seated upright at 90  degrees;Remain upright for at least 30 minutes after po intake    Other  Recommendations Oral Care Recommendations: Oral care BID   Follow up Recommendations        Frequency and Duration min 2x/week  1 week       Prognosis        Swallow Study   General Date of Onset: 12/20/15 HPI: pt is an 82 yo male adm to [facility] with respiratory difficulties.  Pt found to have pna at left and right base,  PMH + for CKD, HIV, tobacco use, neuropathy.  Pt admits to weight loss but son states it is intentional.  Son Psychologist, counselling) does not recall when/if pt had a prior pna.  Type of Study: Bedside Swallow Evaluation Diet Prior to this Study: NPO Temperature Spikes Noted: Yes Respiratory Status: Nasal cannula History of Recent Intubation: No Behavior/Cognition: Cooperative;Lethargic/Drowsy;Alert Oral Cavity Assessment: Dry Oral Care Completed by SLP: Yes Oral Cavity - Dentition: Poor condition Vision: Functional for self-feeding Self-Feeding Abilities: Able to feed self Patient Positioning: Upright in bed Baseline Vocal Quality: Normal Volitional Cough: Weak Volitional Swallow: Unable to elicit    Oral/Motor/Sensory Function Overall Oral Motor/Sensory Function: Generalized oral weakness   Ice Chips Ice chips: Impaired Presentation: Spoon Oral Phase Impairments: Reduced lingual movement/coordination Oral Phase Functional Implications: Prolonged oral transit Pharyngeal Phase Impairments: Suspected delayed Swallow   Thin Liquid Thin Liquid: Impaired Presentation: Cup;Self Fed;Spoon Oral Phase Impairments: Reduced labial seal;Reduced lingual movement/coordination Oral Phase Functional Implications: Prolonged oral transit Pharyngeal  Phase Impairments: Suspected delayed Swallow;Cough - Immediate    Nectar Thick Nectar Thick Liquid: Impaired Presentation: Spoon;Cup;Self Fed Oral Phase Impairments: Reduced lingual movement/coordination;Poor awareness of bolus Oral phase functional implications:  Prolonged oral transit Pharyngeal Phase Impairments: Suspected delayed Swallow;Cough - Delayed Other Comments: delayed cough noted - son reports dad has a baseline cough/expectoration   Honey Thick Honey Thick Liquid: Not tested   Puree Puree: Impaired Presentation: Self Fed;Spoon Oral Phase Impairments: Reduced labial seal;Poor awareness of bolus;Reduced lingual movement/coordination Oral Phase Functional Implications: Prolonged oral transit Pharyngeal Phase Impairments: Suspected delayed Swallow   Solid   GO   Solid: Impaired Presentation: Spoon Oral Phase Impairments: Reduced labial seal;Impaired mastication;Reduced lingual movement/coordination Oral Phase Functional Implications: Prolonged oral transit;Impaired mastication Pharyngeal Phase Impairments: Suspected delayed Elonda Husky, [place] [facility] SLP [phone]

## 2015-12-20 NOTE — Progress Notes (Signed)
Date:  December 20, 2015 Chart reviewed for concurrent status and case management needs. Will continue to follow the patient for status change: remains on hfns at 55% Discharge Planning: following for needs Expected discharge date: [id number] Velva Harman, BSN, [place], [place]

## 2015-12-21 LAB — GLUCOSE, CAPILLARY
GLUCOSE-CAPILLARY: 121 mg/dL — AB (ref 65–99)
GLUCOSE-CAPILLARY: 146 mg/dL — AB (ref 65–99)
GLUCOSE-CAPILLARY: 150 mg/dL — AB (ref 65–99)
Glucose-Capillary: 138 mg/dL — ABNORMAL HIGH (ref 65–99)
Glucose-Capillary: 162 mg/dL — ABNORMAL HIGH (ref 65–99)
Glucose-Capillary: 150 mg/dL — ABNORMAL HIGH (ref 65–99)
Glucose-Capillary: 161 mg/dL — ABNORMAL HIGH (ref 65–99)
Glucose-Capillary: 131 mg/dL — ABNORMAL HIGH (ref 65–99)

## 2015-12-21 LAB — BLOOD GAS, ARTERIAL
ACID-BASE EXCESS: 5 mmol/L — AB (ref 0.0–2.0)
BICARBONATE: 29.6 mmol/L — AB (ref 20.0–28.0)
Drawn by: 441261
O2 CONTENT: 6 L/min
O2 SAT: 90.4 %
PCO2 ART: 45.4 mmHg (ref 32.0–48.0)
PO2 ART: 62.9 mmHg — AB (ref 83.0–108.0)
Patient temperature: 98.6
pH, Arterial: 7.43 (ref 7.350–7.450)

## 2015-12-21 MED ORDER — HYDROCOD POLST-CPM POLST ER 10-8 MG/5ML PO SUER
5.0000 mL | Freq: Two times a day (BID) | ORAL | Status: DC | PRN
  Administered 2015-12-21: 5 mL via ORAL
  Filled 2015-12-21: qty 5

## 2015-12-21 MED ORDER — GUAIFENESIN-DM 100-10 MG/5ML PO SYRP
5.0000 mL | ORAL_SOLUTION | ORAL | Status: DC | PRN
  Administered 2015-12-21: 5 mL via ORAL
  Filled 2015-12-21: qty 10

## 2015-12-21 MED ORDER — LIP MEDEX EX OINT
TOPICAL_OINTMENT | CUTANEOUS | Status: AC
Start: 2015-12-21 — End: 2015-12-21
  Administered 2015-12-21: 1
  Filled 2015-12-21: qty 7

## 2015-12-21 NOTE — Progress Notes (Signed)
Pharmacy Antibiotic Note  Louis Acosta is a 82 y.o. male admitted on 12/18/2015 with sepsis. Pharmacy has been consulted for Vancomycin & Levaquin dosing.  Antibiotics subsequently changed to Vancomycin and Cefepime, then narrowed to Cefepime monotherapy.   12/21/2015: D8 Abx  Afebrile  WBC improved  AoCKD-III - improved  Required up to 10L 02 via [place] with 02 sats 91% yesterday, now down to 3L today, 02 sats 95%  Noted CCM recommended stopping antibiotics 10/1 as warranted  Plan:  Continue Cefepime 1g IV q12h.  F/u duration of therapy of antibiotics  F/u renal function, cultures, clinical course   Height: 5\' 11"  (180.3 cm) Weight: 193 lb 5.5 oz (87.7 kg) IBW/kg (Calculated) : 75.3  Temp (24hrs), Avg:98.1 F (36.7 C), Min:97.2 F (36.2 C), Max:99 F (37.2 C)   Recent Labs Lab 12/15/15 0347 12/16/15 0343 12/17/15 0538 12/18/15 0515 12/18/15 1125 12/19/15 0339 12/20/15 0330  WBC 12.8* 10.8*  --   --  9.0 8.7 9.4  CREATININE 1.63* 1.32* 1.13 1.42* 1.39* 1.27* 1.23    Estimated Creatinine Clearance: 49.3 mL/min (by C-G formula based on SCr of 1.23 mg/dL).    Allergies  Allergen Reactions  . Clindamycin/Lincomycin Nausea And Vomiting   Antimicrobials this admission: 9/25 Levaquin >> 9/25 9/25 Vanc >> 9/25 9/26 cefepime >> 9/26 fluconazole >> 9/26   Dose adjustments this admission: 9/28 Cefepime 1g q24h --> q12h 9/29 Cefepime 1g q12h --> q8h 9/30 Cefepime 1g q8h --> q12h   Microbiology results: 9/25 BCx x2: NGF 9/25 MRSA PCR (-)  9/30 MRSA PCR (-)  Thank you for allowing pharmacy to be a part of this patient's care.  Ralene Bathe, PharmD, BCPS 12/21/2015, 9:30 AM  Pager: [phone]

## 2015-12-21 NOTE — Progress Notes (Addendum)
Speech Language Pathology Treatment: Dysphagia  Patient Details Name: Louis Acosta MRN: [id number] DOB: September 27, 1931 Today's Date: 12/21/2015 Time: 1350-1400 SLP Time Calculation (min) (ACUTE ONLY): 10 min  Assessment / Plan / Recommendation Clinical Impression  Pt today is more lethargic than yesterday with increased RR to 30s and open-mouth breathing posture.   RN reports pt has been somewhat sleepy today but slept well last pm.  SLP aroused pt and provided him with oral care (suctioniong minimal secretions from posterior pharynx) and two tsp boluses of juice.  Delayed swallow present without overt indication of airway compromise.  Pt did have baseline WEAK cough prior to SLP entrance into room.  Lethargy may be contributing to dysphagia and decreased po intake.     Pt may benefit from MBS given his h/o esophageal dysmotility, pt reported frequent coughing with intake prior to admission, deconditioning and pna.  MD please order MBS for when pt is alert if you agree.  SLP paged MD with recommendations. Continue modified diet to mitigate aspiration and assure pt is fully alert before providing intake please.     HPI HPI: pt is an 82 yo male adm to [facility] with respiratory difficulties.  Pt found to have pna.    Pt admits to weight loss but son states it is intentional.  Son Psychologist, counselling) does not recall when/if pt had a prior pna.       SLP Plan  Continue with current plan of care     Recommendations  Diet recommendations: Dysphagia 2 (fine chop);Nectar-thick liquid Liquids provided via: Teaspoon;No straw (no cup) Medication Administration: Whole meds with puree Supervision: Full supervision/cueing for compensatory strategies Compensations: Slow rate;Small sips/bites Postural Changes and/or Swallow Maneuvers: Seated upright 90 degrees;Upright 30-60 min after meal                Oral Care Recommendations: Oral care before and after PO Follow up Recommendations: Skilled Nursing facility Plan:  Continue with current plan of care       [place], [place], [place] [facility] SLP [phone]

## 2015-12-21 NOTE — Progress Notes (Addendum)
Patient ID: Louis Acosta, male   DOB: [birth year], 82 y.o.   MRN: [id number]  PROGRESS NOTE    Louis Acosta  [id number] DOB: 12-23-1931 DOA: 12/08/2015  PCP: [name], MD   Brief Narrative:   82 y.o. male with medical history significant for hypertension, chronic combined CHF, has pacemaker, DM on insulin, dyslipidemia, CKD stage 3 with baseline Cr 1.9 in 05/2015. He comes from home and his son at the bedside provided most of the history . Pt was in his usual state of health, at baseline able to walk with the walker and cane but over past day PTA he was not able to get up without the assistance. He could not ambulate without the assistance and he almost fell. He was also more short of breath especially when trying to get up. No chest pain, no palpitations.  In ED, BP is 73/58, HR 108, RR 21-29, T max 100.2 F, oxygen saturation 73% on room air but has gotten to 93% with 3-4 L [place] oxygen support. Blood work showed leukocytosis of 22.7, hemoglobin 11.5, platelets 148, Cr 1.86, troponin 0.1, lactic acid 2.7. CXR showed volume loss and basilar opacity in the left hemithorax suspicious for infiltrate. He has received 2 L fluids on admission.   Pt more hypoxic on 9/30, O2 sat's in mid 80's on Ventimask. CCM has seen the pt in consultation.    Assessment & Plan:   Principal Problem:   Acute respiratory failure with hypoxia ([place])  - Patient still has high oxygen requirements, anywhere from 10-15 L oxygen support via nasal cannula - CXR 9/30 showed stable dense opacity at the left base and hazy airspace disease at the right base - Continue Atrovent and Xopenex twice daily nebulizer as well as every 2 hours as needed for shortness of breath or wheezing - Continue to monitor in step down unit  Active Problems:   Sepsis due to pneumonia ([place]) / Lobar pneumonia, unspecified organism ([place]) / Leukocytosis  - Sepsis criteria met on admission with fever, tachycardia, tachypnea,  hypoxia, hypotension, leukocytosis, lactic acidosis and CXR with evidence of left lung pneumonia -Stop cefepime today - Vancomycin stopped 9/28  - Blood cultures negative so far    Cough - Obtain SLP eval to rule out possible aspiration    Acute on chronic systolic and diastolic CHF (congestive heart failure) (HCC) / CARDIAC PACEMAKER-[place] - Compensated    Essential hypertension, benign - BP 111/69 without BP meds    CKD (chronic kidney disease) stage 3, GFR 30-59 ml/min - Cr at baseline 1.9 in 05/2015 - Cr is within baseline range, within normal limits this morning    Anemia of chronic kidney disease - Stable hemoglobin at 12.2     Controlled diabetes mellitus with diabetic nephropathy, with long-term current use of insulin (HCC) - Continue sliding scale insulin - CBG's in past 24 hours: 138, 146, 162    Diabetic neuropathy - Continue gabapentin    Hypothyroidism - Continue Synthroid    Dyslipidemia associated with type 2 diabetes mellitus (HCC) - Continue simvastatin 40 mg at bedtime and omega 3 supplementation     Elevated troponin - Due to demand ischemia from sepsis, acute decompensated CHF - No chest pain  - Troponin level 0.20 --> 0.18 --> 0.16 - 2 D ECHO - EF 15%, diffuse hypokinesis     DVT prophylaxis: SCD's bilaterally  Code Status: DNR/DNI  Family Communication: Daughter at the bedside 10/2 Disposition Plan: remains in SDU due to high  oxygen requirements    Consultants:   Cardiology   WOC  PCCM  Procedures:   2 D ECHO - EF 15%, diffuse hypokinesis     Antimicrobials:   Cefepime 12/12/2015 --> 10/3  Vanco 9/25 --> 9/28   Subjective: No overnight events.  Objective: Vitals:   12/21/15 0905 12/21/15 1000 12/21/15 1032 12/21/15 1034  BP:  111/69    Pulse: (!) 103 (!) 103 (!) 109 (!) 109  Resp: 16 (!) 23 (!) 26 14  Temp:      TempSrc:      SpO2: 95% 100% 95% (!) 82%  Weight:      Height:        Intake/Output Summary  (Last 24 hours) at 12/21/15 1034 Last data filed at 12/21/15 1000  Gross per 24 hour  Intake              690 ml  Output              600 ml  Net               90 ml   Filed Weights   12/19/15 0500 12/20/15 0451 12/21/15 0500  Weight: 90.5 kg (199 lb 8.3 oz) 90.4 kg (199 lb 4.7 oz) 87.7 kg (193 lb 5.5 oz)    Examination:  General exam: No distress, calm, comfortable  Respiratory system: Coarse breath sounds, no wheezing Cardiovascular system: S1 & S2 heard, tachycardic  Gastrointestinal system: (+) BS, non distended  Central nervous system: No focal deficits  Extremities: palpable pulses, no swelling  Skin: No lesions or ulcers, skin is warm and dry  Psychiatry: Not agitated, not restless   Data Reviewed: I have personally reviewed following labs and imaging studies  CBC:  Recent Labs Lab 12/15/15 0347 12/16/15 0343 12/18/15 1125 12/19/15 0339 12/20/15 0330  WBC 12.8* 10.8* 9.0 8.7 9.4  NEUTROABS  --   --   --   --  7.6  HGB 11.3* 11.5* 12.6* 12.0* 12.2*  HCT 35.9* 36.2* 39.7 36.5* 38.9*  MCV 102.6* 99.2 101.8* 98.6 101.6*  PLT 148* 153 140* 139* 597   Basic Metabolic Panel:  Recent Labs Lab 12/17/15 0538 12/18/15 0515 12/18/15 1125 12/19/15 0339 12/20/15 0330  NA 133* 134* 135 136 141  K 5.0 5.8* 5.6* 5.3* 5.1  CL 97* 97* 96* 98* 102  CO2 29 29 32 31 28  GLUCOSE 127* 132* 115* 76 91  BUN 52* 59* 62* 64* 60*  CREATININE 1.13 1.42* 1.39* 1.27* 1.23  CALCIUM 9.3 9.5 9.7 9.7 9.7  MG  --   --   --   --  2.2  PHOS  --   --   --   --  3.5   GFR: Estimated Creatinine Clearance: 49.3 mL/min (by C-G formula based on SCr of 1.23 mg/dL). Liver Function Tests:  Recent Labs Lab 12/20/15 0330  ALBUMIN 3.4*   No results for input(s): LIPASE, AMYLASE in the last 168 hours. No results for input(s): AMMONIA in the last 168 hours. Coagulation Profile: No results for input(s): INR, PROTIME in the last 168 hours. Cardiac Enzymes: No results for input(s): CKTOTAL,  CKMB, CKMBINDEX, TROPONINI in the last 168 hours. BNP (last 3 results) No results for input(s): PROBNP in the last 8760 hours. HbA1C: No results for input(s): HGBA1C in the last 72 hours. CBG:  Recent Labs Lab 12/20/15 2215 12/21/15 0031 12/21/15 0350 12/21/15 0552 12/21/15 0717  GLUCAP 140* 150* 138* 146* 162*  Lipid Profile: No results for input(s): CHOL, HDL, LDLCALC, TRIG, CHOLHDL, LDLDIRECT in the last 72 hours. Thyroid Function Tests:  Recent Labs  12/18/15 1125  TSH 5.675*  FREET4 0.80   Anemia Panel: No results for input(s): VITAMINB12, FOLATE, FERRITIN, TIBC, IRON, RETICCTPCT in the last 72 hours. Urine analysis:    Component Value Date/Time   COLORURINE YELLOW 10/23/2012 [place] 10/23/2012 0955   LABSPEC 1.013 10/23/2012 0955   PHURINE 7.0 10/23/2012 0955   GLUCOSEU NEGATIVE 10/23/2012 0955   HGBUR NEGATIVE 10/23/2012 0955   BILIRUBINUR NEGATIVE 10/23/2012 0955   KETONESUR NEGATIVE 10/23/2012 0955   PROTEINUR NEGATIVE 10/23/2012 0955   UROBILINOGEN 1.0 10/23/2012 0955   NITRITE NEGATIVE 10/23/2012 0955   LEUKOCYTESUR NEGATIVE 10/23/2012 0955   Sepsis Labs: '@[email or web address]'$ (procalcitonin:4,lacticidven:4)    Blood Culture (routine x 2)     Status: None (Preliminary result)   Collection Time: 11/27/2015  4:00 PM  Result Value Ref Range Status   Specimen Description   Final    BLOOD LEFT FOREARM Performed at [facility]    Special Requests IN PEDIATRIC BOTTLE  2 CC  Final   Culture PENDING  Incomplete   Report Status PENDING  Incomplete  MRSA PCR Screening     Status: None   Collection Time: 12/11/2015  6:25 PM  Result Value Ref Range Status   MRSA by PCR NEGATIVE NEGATIVE Final      Radiology Studies: Dg Chest Port 1 View Result Date: 12/11/2015 1. Progressive volume loss and basilar opacity in the left hemithorax suspicious for atelectasis or infiltrate. Probable adjacent small left pleural effusion. Followup PA and lateral  chest X-ray is recommended in 3-4 weeks (following trial of antibiotic therapy if clinically warranted) to ensure resolution and exclude underlying malignancy. 2. Cardiomegaly with chronic vascular congestion.      Scheduled Meds: . ceFEPime (MAXIPIME) IV  1 g Intravenous Q12H  . fluticasone  2 spray Each Nare Daily  . insulin aspart  0-9 Units Subcutaneous Q4H  . ipratropium  0.5 mg Nebulization BID  . levalbuterol  1.25 mg Nebulization BID  . mouth rinse  15 mL Mouth Rinse BID  . polyvinyl alcohol  1 drop Left Eye Daily  . prednisoLONE acetate  1 drop Right Eye QHS  . sodium chloride  1,000 mL Intravenous Once   And  . sodium chloride  1,000 mL Intravenous Once   Continuous Infusions: . sodium chloride 10 mL/hr at 12/21/15 1000     LOS: 8 days    Time spent: 25 minutes  Greater than 50% of the time spent on counseling and coordinating the care.   Leisa Lenz, MD [facility] Pager [phone]  If 7PM-7AM, please contact night-coverage [email or web address] Password [email or web address] 12/21/2015, 10:34 AM

## 2015-12-21 NOTE — Clinical Social Work Note (Signed)
Clinical Social Work Assessment  Patient Details  Name: Louis Acosta MRN: [id number] Date of Birth: [birth year]  Date of referral:  12/21/15               Reason for consult:  Discharge Planning                Permission sought to share information with:    Permission granted to share information::     Name::        Agency::     Relationship::     Contact Information:     Housing/Transportation Living arrangements for the past 2 months:  Single Family Home Source of Information:  Adult Children Patient Interpreter Needed:  None Criminal Activity/Legal Involvement Pertinent to Current Situation/Hospitalization:  No - Comment as needed Significant Relationships:  Adult Children, Spouse Lives with:  Spouse Do you feel safe going back to the place where you live?   (SNF most likely needed.) Need for family participation in patient care:  Yes (Comment)  Care giving concerns:  Pt will need more care than is available at home on d/c.   Social Worker assessment / plan:  Pt hospitalized on 12/07/2015 from home with acute respiratory failure with hypoxia. CSW met with pt's daughter, Lattie Haw [phone], to assist with d/c planning. Pt is oriented to person only and was unable to participate in d/c planning. PT recommendations reviewed with daughter. She is in agreement with SNF placement at d/c. CSW will initiate SNF search closer to d/c. Pt remains in SDU. SNF list provided to daughter. CSW will continue to follow to assist with d/c placement. Employment status:  Retired Nurse, adult PT Recommendations:  [place] / Referral to community resources:  [place]  Patient/Family's Response to care:  Daughter is in agreement with SNF at d/c.  Patient/Family's Understanding of and Emotional Response to Diagnosis, Current Treatment, and Prognosis:  Daughter is aware of pt's medical status. " I'm hoping my dad does well enough to  be able to go to rehab. There was a time when we  were not sure if he would leave the hospital. " Support / reassurance provided.  Emotional Assessment Appearance:  Appears stated age Attitude/Demeanor/Rapport:  Other, Unable to Assess Affect (typically observed):  Unable to Assess Orientation:  Oriented to Self, Oriented to Place Alcohol / Substance use:  Not Applicable Psych involvement (Current and /or in the community):  No (Comment)  Discharge Needs  Concerns to be addressed:  Discharge Planning Concerns Readmission within the last 30 days:  No Current discharge risk:  None Barriers to Discharge:  No Barriers Identified   Luretha Rued, [place] 12/21/2015, 4:22 PM

## 2015-12-22 ENCOUNTER — Inpatient Hospital Stay (HOSPITAL_COMMUNITY): Payer: Medicare Other

## 2015-12-22 DIAGNOSIS — N183 Chronic kidney disease, stage 3 (moderate): Secondary | ICD-10-CM

## 2015-12-22 DIAGNOSIS — T17908S Unspecified foreign body in respiratory tract, part unspecified causing other injury, sequela: Secondary | ICD-10-CM

## 2015-12-22 DIAGNOSIS — J181 Lobar pneumonia, unspecified organism: Secondary | ICD-10-CM

## 2015-12-22 DIAGNOSIS — R748 Abnormal levels of other serum enzymes: Secondary | ICD-10-CM

## 2015-12-22 DIAGNOSIS — L8990 Pressure ulcer of unspecified site, unspecified stage: Secondary | ICD-10-CM | POA: Diagnosis present

## 2015-12-22 DIAGNOSIS — R34 Anuria and oliguria: Secondary | ICD-10-CM

## 2015-12-22 DIAGNOSIS — T17908D Unspecified foreign body in respiratory tract, part unspecified causing other injury, subsequent encounter: Secondary | ICD-10-CM

## 2015-12-22 LAB — GLUCOSE, CAPILLARY
GLUCOSE-CAPILLARY: 120 mg/dL — AB (ref 65–99)
Glucose-Capillary: 114 mg/dL — ABNORMAL HIGH (ref 65–99)
Glucose-Capillary: 118 mg/dL — ABNORMAL HIGH (ref 65–99)
Glucose-Capillary: 121 mg/dL — ABNORMAL HIGH (ref 65–99)
Glucose-Capillary: 123 mg/dL — ABNORMAL HIGH (ref 65–99)
Glucose-Capillary: 129 mg/dL — ABNORMAL HIGH (ref 65–99)

## 2015-12-22 LAB — BASIC METABOLIC PANEL WITH GFR
Anion gap: 9 (ref 5–15)
BUN: 67 mg/dL — ABNORMAL HIGH (ref 6–20)
CO2: 29 mmol/L (ref 22–32)
Calcium: 9.9 mg/dL (ref 8.9–10.3)
Chloride: 105 mmol/L (ref 101–111)
Creatinine, Ser: 1.48 mg/dL — ABNORMAL HIGH (ref 0.61–1.24)
GFR calc Af Amer: 49 mL/min — ABNORMAL LOW
GFR calc non Af Amer: 42 mL/min — ABNORMAL LOW
Glucose, Bld: 124 mg/dL — ABNORMAL HIGH (ref 65–99)
Potassium: 5.3 mmol/L — ABNORMAL HIGH (ref 3.5–5.1)
Sodium: 143 mmol/L (ref 135–145)

## 2015-12-22 LAB — CBC
HCT: 39.5 % (ref 39.0–52.0)
Hemoglobin: 12.4 g/dL — ABNORMAL LOW (ref 13.0–17.0)
MCH: 32 pg (ref 26.0–34.0)
MCHC: 31.4 g/dL (ref 30.0–36.0)
MCV: 101.8 fL — ABNORMAL HIGH (ref 78.0–100.0)
PLATELETS: 153 10*3/uL (ref 150–400)
RBC: 3.88 MIL/uL — AB (ref 4.22–5.81)
RDW: 15.7 % — AB (ref 11.5–15.5)
WBC: 8.8 10*3/uL (ref 4.0–10.5)

## 2015-12-22 LAB — BRAIN NATRIURETIC PEPTIDE: B Natriuretic Peptide: 3356.2 pg/mL — ABNORMAL HIGH (ref 0.0–100.0)

## 2015-12-22 MED ORDER — FUROSEMIDE 10 MG/ML IJ SOLN
40.0000 mg | Freq: Two times a day (BID) | INTRAMUSCULAR | Status: DC
Start: 2015-12-22 — End: 2015-12-25
  Administered 2015-12-22 – 2015-12-24 (×5): 40 mg via INTRAVENOUS
  Filled 2015-12-22 (×6): qty 4

## 2015-12-22 MED ORDER — ACETAMINOPHEN 650 MG RE SUPP
650.0000 mg | Freq: Four times a day (QID) | RECTAL | Status: DC | PRN
  Administered 2015-12-22 – 2015-12-25 (×2): 650 mg via RECTAL
  Filled 2015-12-22: qty 1

## 2015-12-22 MED ORDER — FUROSEMIDE 10 MG/ML IJ SOLN
40.0000 mg | Freq: Once | INTRAMUSCULAR | Status: AC
  Administered 2015-12-22: 40 mg via INTRAVENOUS
  Filled 2015-12-22: qty 4

## 2015-12-22 MED ORDER — ACETAMINOPHEN 650 MG RE SUPP
650.0000 mg | Freq: Four times a day (QID) | RECTAL | Status: DC | PRN
  Filled 2015-12-22: qty 1

## 2015-12-22 NOTE — Progress Notes (Signed)
PULMONARY / CRITICAL CARE MEDICINE   Name: Louis Acosta MRN: [id number] DOB: 1931-09-12    ADMISSION DATE:  12/04/2015 CONSULTATION DATE:  12/18/2015  REFERRING MD:  Leisa Lenz, M.D. / [facility]  CHIEF COMPLAINT:  Acute Hypoxic Respiratory Failure  HISTORY OF PRESENT ILLNESS:  82 y.o. male with known history of chronic congestive heart failure status post pacemaker placement along with diabetes, hypertension, and chronic renal failure stage III. At baseline the patient is able to walk with a cane and walker but in the day prior to arrival patient was having difficulty getting up without assistance. Patient indeed almost fell per electronic medical record. Patient was also noted to have dyspnea when attempting to get up as well. Per electronic medical record patient denied any chest pain or palpitations. In the emergency department he was noted to have hypotension with a systolic blood pressure of 73 and was saturating 73% on room air. The patient required 3-4 L/m of oxygen via nasal cannula to achieve a saturation of 93%. With patient's leukocytosis on arrival and basilar opacities. He was started on broad-spectrum antibiotic coverage. He was also administered 2 L of IV fluid on admission. On the morning of transfer to the stepdown unit patient was noted to have a saturation in the mid 80s on Ventimask. PCCM was consulted to assist in evaluation of the patient's hypoxia. Patient appears to have increased oxygen requirements up to 7 L/m via nasal cannula since 9/28. Patient is altered and unable to provide further history or review of systems.  SUBJECTIVE: Sleepy not in distress  VITAL SIGNS: BP 105/69   Pulse (!) 109   Temp 98.8 F (37.1 C) (Axillary)   Resp (!) 28   Ht 5\' 11"  (1.803 m)   Wt 198 lb 6.6 oz (90 kg)   SpO2 90%   BMI 27.67 kg/m  High flow 10 liters HEMODYNAMICS:    VENTILATOR SETTINGS: FiO2 (%):  [55 %] 55 %  INTAKE / OUTPUT: I/O last 3 completed shifts: In: 600  [I.V.:450; IV Piggyback:150] Out: 700 [Urine:700]  PHYSICAL EXAMINATION: General:  Eyes closed, sleepy. Slow to wake up HEENT:  Moist mucus membranes. No scleral icterus. Platter in place  Cardiovascular:  Regular rhythm. Improving bilateral upper extremity edema. No appreciable JVD.  Pulmonary: decreased t/o, no accessory use  Abdomen: Soft. Normal bowel sounds. Protuberant. Nontender.  Musculoskeletal:  Normal bulk and tone. No joint deformity or effusion appreciated. Neurological:  Oriented x4. Grossly nonfocal. No meningismus.  LABS:  BMET  Recent Labs Lab 12/19/15 0339 12/20/15 0330 12/22/15 0334  NA 136 141 143  K 5.3* 5.1 5.3*  CL 98* 102 105  CO2 31 28 29   BUN 64* 60* 67*  CREATININE 1.27* 1.23 1.48*  GLUCOSE 76 91 124*    Electrolytes  Recent Labs Lab 12/19/15 0339 12/20/15 0330 12/22/15 0334  CALCIUM 9.7 9.7 9.9  MG  --  2.2  --   PHOS  --  3.5  --     CBC  Recent Labs Lab 12/19/15 0339 12/20/15 0330 12/22/15 0334  WBC 8.7 9.4 8.8  HGB 12.0* 12.2* 12.4*  HCT 36.5* 38.9* 39.5  PLT 139* 158 153    Coag's No results for input(s): APTT, INR in the last 168 hours.  Sepsis Markers No results for input(s): LATICACIDVEN, PROCALCITON, O2SATVEN in the last 168 hours.  ABG  Recent Labs Lab 12/18/15 0848 12/21/15 1531  PHART 7.338* 7.430  PCO2ART 56.5* 45.4  PO2ART 96.5 62.9*  Liver Enzymes  Recent Labs Lab 12/20/15 0330  ALBUMIN 3.4*    Cardiac Enzymes No results for input(s): TROPONINI, PROBNP in the last 168 hours.  Glucose  Recent Labs Lab 12/21/15 1525 12/21/15 2011 12/21/15 2310 12/22/15 0338 12/22/15 0713 12/22/15 1148  GLUCAP 161* 131* 121* 114* 123* 129*    Imaging Dg Chest Port 1 View  Result Date: 12/22/2015 CLINICAL DATA:  Shortness of breath. EXAM: PORTABLE CHEST 1 VIEW COMPARISON:  12/18/2015. FINDINGS: AICD noted in stable position. Cardiomegaly with pulmonary vascular prominence and bilateral interstitial  prominence consistent with congestive heart failure. Left lower lobe atelectasis and/or infiltrate. Small bilateral pleural effusions. IMPRESSION: 1.  AICD noted in good anatomic position. 2.  Congestive heart failure with pulmonary interstitial edema . 3.  Left lower lobe atelectasis and/or infiltrate. Electronically Signed   By: Marcello Moores  Register   On: 12/22/2015 09:07     STUDIES:  TTE 9/26: LV severely dilated with EF 15% and diffuse hypokinesis. No apical thrombus noted. Grade 1 diastolic dysfunction. LA moderately dilated & RA mild to moderately dilated. RV normal in size with moderately reduced systolic function. Pulmonary artery systolic pressure 56 mmHg. No aortic stenosis or regurgitation. Aortic root normal in size. Mild mitral regurgitation without stenosis. No pulmonic regurgitation. Moderate tricuspid regurgitation. Unclear if there is a pericardial effusion. IVC 2.5 cm with less than 50% respiratory phase variation.  PORT CXR 9/30:  Previously reviewed by me. Suggestion of cardiomegaly with vascular congestion. Questionable left basilar opacity relatively unchanged. Slight worsening of right hemidiaphragm silhouetting. Pacer wires/AICD wires in place.  MICROBIOLOGY: MRSA PCR 9/25:  Negative Blood Ctx x2 9/25:  Negative MRSA PCR 9/30:  Negative   ANTIBIOTICS: Vancomycin 9/25 x1 dose Cefepime 9/25 >>  SIGNIFICANT EVENTS: 9/25 - Admit w/ hypoxia requiring BiPAP 9/30 - Transferred back to SDU w/ worsening in hypoxia  LINES/TUBES: PIV   ASSESSMENT / PLAN:  Acute Hypoxic Respiratory Failure. Chronic Hypercarbic Respiratory Failure Shock - Resolved w/ IVF in ED on arrival. Combined Systolic & Diastolic CHF - EF 0000000 on [id number] TTE. is up 10 pounds  H/O HTN & Dyslipidemia S/P AICD/Pacemaker Placement Acute on Chronic Renal Failure Stage 3 - Improving. Hyperkalemia - Mild & improving. H/O Enlarged Prostate   H/O Hiatal Hernia Leukocytosis - Resolved. Likely due to  sepsis. Thrombocytopenia - Mild. Likely due to sepsis. Anemia - Mild w/ stable Hgb. No signs of active bleeding. Sepsis Possible Severe CAP H/O DM Hypothyroidism Acute Encephalopathy - Multifactorial with likely a strong component of delirium. Improved significantly. Memory Loss  Pulmonary problem list  On-going Acute Hypoxic Respiratory Failure in the setting of bilateral pulmonary infiltrates, pulmonary edema and LLL PNA (CAP  NOS vs aspiration ) +/- effusion. Favor aspiration & edema as main issues in care.   His FIO2 requirements have remained high in spite of empiric abx and diuretic therapy w/ little to no significant improvement in CXR. PCCM was asked to see again d/t lack of clinical improvement.  To date: - he completed 8d of cefepime - he is ~ 2.4 liters negative since admit - he was restarted on HCAP coverage on 10/4 - no improvement in O2 demand - has been seen by SLP and swallow eval held off d/t sedation   Plan -Cont O2 and wean for > 88% -Can cont current abx for HCAP but not convinced he is actively infected.  -Cont lasix (would continue to push as it looks like his baseline creatinine is about 1.9 from eval march  2017) -Need to get swallow eval. This would be helpful to determine how bad his dysphagia is which would be a huge factor in helping to determine prognosis.  -VQ scan to r/o PE could be considered when/If FIO2 requirements improve.    Erick Colace ACNP-BC [place] Pager # [phone] OR # [phone] if no answer  12/22/2015, 11:54 AM

## 2015-12-22 NOTE — Progress Notes (Addendum)
PROGRESS NOTE                                                                                                                                                                                                             Patient Demographics:    Louis Acosta, is a 82 y.o. male, DOB - 1931-06-01, GNO:[id number]  Admit date - 12/17/2015   Admitting Physician Robbie Lis, MD  Outpatient Primary MD for the patient is [name], MD  LOS - 9  Outpatient Specialists:none  Chief Complaint  Patient presents with  . Weakness       Brief Narrative   82 year old male with hypertension, chronic combined CHF with decompensation, pacemaker status, diabetes mellitus on insulin, dyslipidemia, she kidney stage III presented from home with generalized weakness and increasing shortness of breath. In the ED he was hypotensive with blood pressure of 73/58 mmHg, tachycardic in the low 100s, tachypnea and febrile to 100.51F and hypoxic on room air at 72%, improved to 93% with 4 L nasal cannula. Blood work showed significant leukocytosis of 22.7, creatinine 1.86 and lactic acid of 2.7. Patient met criteria for sepsis. Chest x-ray showed volume loss and basilar opacity in left hemithorax suspicious of infiltrate. Patient requiring step down monitoring given ongoing hypoxic respiratory failure.   Subjective:   Patient remains hypoxic requiring 15 L high flow O2 via nasal cannula. He is lethargic as well.   Assessment  & Plan :    Principal Problem:   Acute respiratory failure with hypoxia (HCC) Suspected due to decompensated respiratory failure and community-acquired pneumonia. Patient has completed 8 days of antibiotic course. Chest x-ray repeated today showing pulmonary vascular congestion. I will start him on scheduled IV Lasix 40 mg twice a day. Monitor strict I/O (-2.5 L since admission). -May need a VQ scan to rule out PE if  dyspnea unimproved. I suspect patient is also having silent aspiration. Ordered MBS as per SLP recommendation. Pulmonary consult requested to reevaluate. Patient appears quite frail and deconditioned. I will consult palliative care for goals of care discussion. ( discussed with daughter who agrees with this).   Active Problems: Sepsis secondary to healthcare associated pneumonia Sepsis is resolved but patient still in respiratory failure. Has completed 8 days of antibiotics. Suspect silent aspiration. MBS ordered. Will hold of on resuming antibiotics for now.  Acute on chronic combined systolic and diastolic CHF (congestive heart failure) ([place]) 2-D echo with EF of 15%. I will start IV Lasix 40 mg every 12 hours. Monitor strict I/O and daily weight.   Essential hypertension, benign Stable without medications.  CKD (chronic kidney disease) stage 3, GFR 30-59 ml/min Baseline of 1.9. Improved. Monitor with IV diuresis.  Anemia of chronic kidney disease Stable.  Controlled diabetes mellitus with diabetic nephropathy, with long-term current use of insulin (HCC) - CBG stable on sliding scale coverage.     Diabetic neuropathy Hold Neurontin given somnolence and delirium.    Hypothyroidism - Continue Synthroid  Dyslipidemia associated with type 2 diabetes mellitus (HCC) Continue statin.  Elevated troponin Secondary to demand ischemia. Low EF with diffuse hypokinesis on 2-D echo.   Hyperkalemia Should improve with diuresis. Monitor in am.  Acute encephalopathy Per wife lately he has been having visual hallucinations. Patient is somnolent and delirious. ? Hypercapnia. Monitor for now.    Code Status : DO NOT RESUSCITATE  Family Communication  : Discussed in detail with wife and daughter on the phone.  Disposition Plan  : Currently stepdown monitoring  Barriers For Discharge : Active symptoms  Consults  :   PC CM Palliative care  Procedures  :  2-D echo  DVT Prophylaxis  :  Lovenox -   Lab Results  Component Value Date   PLT 153 12/22/2015    Antibiotics  :    Anti-infectives    Start     Dose/Rate Route Frequency Ordered Stop   12/18/15 1800  ceFEPIme (MAXIPIME) 1 g in dextrose 5 % 50 mL IVPB  Status:  Discontinued     1 g 100 mL/hr over 30 Minutes Intravenous Every 12 hours 12/18/15 1031 12/21/15 1038   12/17/15 1400  ceFEPIme (MAXIPIME) 1 g in dextrose 5 % 50 mL IVPB  Status:  Discontinued     1 g 100 mL/hr over 30 Minutes Intravenous Every 8 hours 12/17/15 0828 12/18/15 1031   12/16/15 1600  ceFEPIme (MAXIPIME) 1 g in dextrose 5 % 50 mL IVPB  Status:  Discontinued     1 g 100 mL/hr over 30 Minutes Intravenous Every 12 hours 12/16/15 1356 12/17/15 0828   12/15/15 1700  levofloxacin (LEVAQUIN) IVPB 750 mg  Status:  Discontinued     750 mg 100 mL/hr over 90 Minutes Intravenous Every 48 hours 11/24/2015 1807 12/05/2015 1830   12/14/15 1700  vancomycin (VANCOCIN) 1,250 mg in sodium chloride 0.9 % 250 mL IVPB  Status:  Discontinued     1,250 mg 166.7 mL/hr over 90 Minutes Intravenous Every 24 hours 12/10/2015 1807 12/04/2015 1829   12/14/15 1030  fluconazole (DIFLUCAN) IVPB 100 mg     100 mg 50 mL/hr over 60 Minutes Intravenous  Once 12/14/15 0932 12/14/15 1142   12/14/15 0100  ceFEPIme (MAXIPIME) 1 g in dextrose 5 % 50 mL IVPB  Status:  Discontinued     1 g 100 mL/hr over 30 Minutes Intravenous Every 24 hours 12/06/2015 1956 12/16/15 1355   12/17/2015 1830  ceFEPIme (MAXIPIME) 2 g in dextrose 5 % 50 mL IVPB  Status:  Discontinued     2 g 100 mL/hr over 30 Minutes Intravenous  Once 12/06/2015 1829 12/12/2015 1830   11/23/2015 1830  vancomycin (VANCOCIN) IVPB 1000 mg/200 mL premix  Status:  Discontinued     1,000 mg 200 mL/hr over 60 Minutes Intravenous  Once 12/12/2015 1829 12/06/2015 2001   11/26/2015 1815  vancomycin (  VANCOCIN) 500 mg in sodium chloride 0.9 % 100 mL IVPB  Status:  Discontinued     500 mg 100 mL/hr over 60 Minutes  Intravenous NOW 11/29/2015 1807 11/30/2015 1829   12/12/2015 1700  levofloxacin (LEVAQUIN) IVPB 750 mg  Status:  Discontinued     750 mg 100 mL/hr over 90 Minutes Intravenous  Once 12/12/2015 1657 11/25/2015 1830   11/21/2015 1700  vancomycin (VANCOCIN) IVPB 1000 mg/200 mL premix     1,000 mg 200 mL/hr over 60 Minutes Intravenous  Once 11/23/2015 1657 12/18/2015 1819        Objective:   Vitals:   12/22/15 1100 12/22/15 1200 12/22/15 1340 12/22/15 1342  BP:  111/71    Pulse:      Resp: (!) 28 (!) 32  18  Temp:  100.1 F (37.8 C) 98.7 F (37.1 C)   TempSrc:  Axillary Axillary   SpO2: 90% 98%  100%  Weight:      Height:        Wt Readings from Last 3 Encounters:  12/22/15 90 kg (198 lb 6.6 oz)  08/11/15 85.4 kg (188 lb 3.2 oz)  06/16/15 93 kg (205 lb)     Intake/Output Summary (Last 24 hours) at 12/22/15 1416 Last data filed at 12/22/15 1348  Gross per 24 hour  Intake              320 ml  Output              425 ml  Net             -105 ml     Physical Exam  Gen: Somnolent but arousable, confused HEENT: no pallor, moist mucosa, supple neck Chest: Bilateral breath sounds CVS: N S1&S2, no murmurs, rubs or gallop GI: soft, NT, ND, BS+ Musculoskeletal: warm, no edema CNS: Somnolent but arousable    Data Review:    CBC  Recent Labs Lab 12/16/15 0343 12/18/15 1125 12/19/15 0339 12/20/15 0330 12/22/15 0334  WBC 10.8* 9.0 8.7 9.4 8.8  HGB 11.5* 12.6* 12.0* 12.2* 12.4*  HCT 36.2* 39.7 36.5* 38.9* 39.5  PLT 153 140* 139* 158 153  MCV 99.2 101.8* 98.6 101.6* 101.8*  MCH 31.5 32.3 32.4 31.9 32.0  MCHC 31.8 31.7 32.9 31.4 31.4  RDW 14.8 15.0 14.8 15.3 15.7*  LYMPHSABS  --   --   --  1.0  --   MONOABS  --   --   --  0.8  --   EOSABS  --   --   --  0.1  --   BASOSABS  --   --   --  0.0  --     Chemistries   Recent Labs Lab 12/18/15 0515 12/18/15 1125 12/19/15 0339 12/20/15 0330 12/22/15 0334  NA 134* 135 136 141 143  K 5.8* 5.6* 5.3* 5.1 5.3*  CL 97* 96* 98*  102 105  CO2 29 32 '31 28 29  '$ GLUCOSE 132* 115* 76 91 124*  BUN 59* 62* 64* 60* 67*  CREATININE 1.42* 1.39* 1.27* 1.23 1.48*  CALCIUM 9.5 9.7 9.7 9.7 9.9  MG  --   --   --  2.2  --    ------------------------------------------------------------------------------------------------------------------ No results for input(s): CHOL, HDL, LDLCALC, TRIG, CHOLHDL, LDLDIRECT in the last 72 hours.  No results found for: HGBA1C ------------------------------------------------------------------------------------------------------------------ No results for input(s): TSH, T4TOTAL, T3FREE, THYROIDAB in the last 72 hours.  Invalid input(s): FREET3 ------------------------------------------------------------------------------------------------------------------ No results for input(s): VITAMINB12, FOLATE, FERRITIN,  TIBC, IRON, RETICCTPCT in the last 72 hours.  Coagulation profile No results for input(s): INR, PROTIME in the last 168 hours.  No results for input(s): DDIMER in the last 72 hours.  Cardiac Enzymes No results for input(s): CKMB, TROPONINI, MYOGLOBIN in the last 168 hours.  Invalid input(s): CK ------------------------------------------------------------------------------------------------------------------    Component Value Date/Time   BNP 3,356.2 (H) 12/22/2015 0334    Inpatient Medications  Scheduled Meds: . fluticasone  2 spray Each Nare Daily  . insulin aspart  0-9 Units Subcutaneous Q4H  . ipratropium  0.5 mg Nebulization BID  . levalbuterol  1.25 mg Nebulization BID  . mouth rinse  15 mL Mouth Rinse BID  . polyvinyl alcohol  1 drop Left Eye Daily  . prednisoLONE acetate  1 drop Right Eye QHS  . sodium chloride  1,000 mL Intravenous Once   And  . sodium chloride  1,000 mL Intravenous Once   Continuous Infusions: . sodium chloride 10 mL/hr at 12/21/15 1000   PRN Meds:.acetaminophen, acetaminophen, chlorpheniramine-HYDROcodone, guaiFENesin-dextromethorphan,  ipratropium, levalbuterol, ondansetron **OR** ondansetron (ZOFRAN) IV, RESOURCE THICKENUP CLEAR  Micro Results Recent Results (from the past 240 hour(s))  Blood Culture (routine x 2)     Status: None   Collection Time: 12/05/2015  4:00 PM  Result Value Ref Range Status   Specimen Description BLOOD LEFT FOREARM  Final   Special Requests IN PEDIATRIC BOTTLE  2 CC  Final   Culture   Final    NO GROWTH 5 DAYS Performed at [facility]    Report Status 12/18/2015 FINAL  Final  Blood Culture (routine x 2)     Status: None   Collection Time: 12/05/2015  4:14 PM  Result Value Ref Range Status   Specimen Description BLOOD LEFT ANTECUBITAL  Final   Special Requests BOTTLES DRAWN AEROBIC AND ANAEROBIC 5 CC EA  Final   Culture   Final    NO GROWTH 5 DAYS Performed at [facility]    Report Status 12/18/2015 FINAL  Final  MRSA PCR Screening     Status: None   Collection Time: 11/26/2015  6:25 PM  Result Value Ref Range Status   MRSA by PCR NEGATIVE NEGATIVE Final    Comment:        The GeneXpert MRSA Assay (FDA approved for NASAL specimens only), is one component of a comprehensive MRSA colonization surveillance program. It is not intended to diagnose MRSA infection nor to guide or monitor treatment for MRSA infections.   MRSA PCR Screening     Status: None   Collection Time: 12/18/15 10:29 AM  Result Value Ref Range Status   MRSA by PCR NEGATIVE NEGATIVE Final    Comment:        The GeneXpert MRSA Assay (FDA approved for NASAL specimens only), is one component of a comprehensive MRSA colonization surveillance program. It is not intended to diagnose MRSA infection nor to guide or monitor treatment for MRSA infections.     Radiology Reports Dg Chest Port 1 View  Result Date: 12/22/2015 CLINICAL DATA:  Shortness of breath. EXAM: PORTABLE CHEST 1 VIEW COMPARISON:  12/18/2015. FINDINGS: AICD noted in stable position. Cardiomegaly with pulmonary vascular prominence  and bilateral interstitial prominence consistent with congestive heart failure. Left lower lobe atelectasis and/or infiltrate. Small bilateral pleural effusions. IMPRESSION: 1.  AICD noted in good anatomic position. 2.  Congestive heart failure with pulmonary interstitial edema . 3.  Left lower lobe atelectasis and/or infiltrate. Electronically Signed   By:  [place]   On: 12/22/2015 09:07   Dg Chest Port 1 View  Result Date: 12/18/2015 CLINICAL DATA:  Hypoxia EXAM: PORTABLE CHEST 1 VIEW COMPARISON:  11/19/2015 FINDINGS: Left subclavian AICD device and leads are stable. Leads are intact. Moderate cardiomegaly. Left base opacified. Hazy airspace disease at the right base. Vascular congestion. No evidence of Kerley B line. IMPRESSION: Stable dense opacity at the left base. Hazy airspace disease at the right base. Findings are not significantly changed. Cardiomegaly and vascular congestion. Electronically Signed   By: Marybelle Killings M.D.   On: 12/18/2015 10:11   Dg Chest Port 1 View  Result Date: 11/23/2015 CLINICAL DATA:  Chronic shortness of breath. On home oxygen. History of hypertension and diabetes. EXAM: PORTABLE CHEST 1 VIEW COMPARISON:  12/23/2012. FINDINGS: 1633 hours. Mild patient rotation to the left. Left subclavian AICD leads appear grossly unchanged. Old leads are present. The heart is enlarged. There is aortic atherosclerosis. There is progressive volume loss and opacity inferiorly in the left hemithorax with a probable adjacent small left pleural effusion. There is mild overall vascular congestion without focal opacity in the right lung. The bones appear unchanged. IMPRESSION: 1. Progressive volume loss and basilar opacity in the left hemithorax suspicious for atelectasis or infiltrate. Probable adjacent small left pleural effusion. Followup PA and lateral chest X-ray is recommended in 3-4 weeks (following trial of antibiotic therapy if clinically warranted) to ensure resolution and exclude  underlying malignancy. 2. Cardiomegaly with chronic vascular congestion. Electronically Signed   By: Richardean Sale M.D.   On: 12/15/2015 16:57    Time Spent in minutes  35   Louellen Molder M.D on 12/22/2015 at 2:16 PM  Between 7am to 7pm - Pager - [phone]  After 7pm go to [email or web address] - password [facility]  [facility] -  Office  [phone]

## 2015-12-22 NOTE — Progress Notes (Signed)
Speech Language Pathology Treatment: Dysphagia  Patient Details Name: Louis Acosta MRN: [id number] DOB: 1931-07-13 Today's Date: 12/22/2015 Time: [id number] SLP Time Calculation (min) (ACUTE ONLY): 9 min  Assessment / Plan / Recommendation Clinical Impression  Diagnostic treatment complete at bedside to determine readiness for MBS today given RN report of lethargy. Patient arouses to voice via eye opening however unable to maintain adequate level of arousal for safe po intake/MBS. Oral care complete. Provided patient with one ice chip with little oral manipulation of bolus, eventual oral holding, and suspected delayed swallow initiation, all of which required max cueing. Recommend NPO as aspiration risk will be high with all pos at this time given lethargic state. Will f/u 10/5.    HPI HPI: pt is an 82 yo male adm to [facility] with respiratory difficulties.  Pt found to have pna.    Pt admits to weight loss but son states it is intentional.  Son Psychologist, counselling) does not recall when/if pt had a prior pna.       SLP Plan  Goals updated     Recommendations  Diet recommendations: NPO Medication Administration: Via alternative means                Oral Care Recommendations: Oral care QID Follow up Recommendations: Skilled Nursing facility Plan: Goals updated       [place], [place] [phone]    [place] 12/22/2015, 7:54 AM

## 2015-12-22 NOTE — Progress Notes (Signed)
Initial Nutrition Assessment  DOCUMENTATION CODES:   Not applicable  INTERVENTION:  - RD will monitor for needs at follow-up. - RD will follow-up on 10/6.  NUTRITION DIAGNOSIS:   Inadequate oral intake related to lethargy/confusion as evidenced by other (see comment) (RN report).  GOAL:   Patient will meet greater than or equal to 90% of their needs  MONITOR:   PO intake, Weight trends, Labs, Skin, I & O's  REASON FOR ASSESSMENT:   LOS  ASSESSMENT:   82 y.o. male with medical history significant for hypertension, chronic combined CHF, has pacemaker, DM on insulin, dyslipidemia, CKD stage 3 with baseline Cr 1.9 in 05/2015. He comes from home and his son at the bedside provided most of the history . Pt was in his usual state of health, at baseline able to walk with the walker and cane but over past day PTA he was not able to get up without the assistance. He could not ambulate without the assistance and he almost fell. He was also more short of breath especially when trying to get up.  Pt seen for LOS (9 days). BMI indicates overweight status. Per chart review, pt consumed 100% of lunch on 9/26 and 50% of breakfast, 75% of dinner on 9/27. No other PO intakes documented since admission. No family/visitors present at bedside. SLP notes from 10/2, 10/3, and this AM (indicating pt inappropriate for MBS this AM d/t lethargy) reviewed. Spoke with RN who reports that pt was on HFNC at time of SLP but respiratory status has since worsened and he was placed on non-rebreather.   Physical assessment deferred at this time with respect to pt's comfort. Per chart review, weight has had multiple fluctuations since admission. Weight is down 7 lbs (3.4% body weight) since March 2017 which is not significant for time frame.  Medications reviewed; sliding scale Novolog, PRN Zofran.  Labs reviewed; CBGs: 114 and 123 mg/dL this AM, K: 5.3 mmol/L, BUN: 67 mg/dL, creatinine: 1.48 mg/dL, GFR: 42 mL/min.     Diet Order:  DIET DYS 2 Room service appropriate? Yes; Fluid consistency: Nectar Thick  Skin:     Last BM:  9/25  Height:   Ht Readings from Last 1 Encounters:  12/18/15 5\' 11"  (1.803 m)    Weight:   Wt Readings from Last 1 Encounters:  12/22/15 198 lb 6.6 oz (90 kg)    Ideal Body Weight:  78.18 kg  BMI:  Body mass index is 27.67 kg/m.  Estimated Nutritional Needs:   Kcal:  1400-1600  Protein:  75-85 grams  Fluid:  >/= 1.6 L/day  EDUCATION NEEDS:   No education needs identified at this time    Jarome Matin, MS, RD, LDN Inpatient Clinical Dietitian Pager # [phone] After hours/weekend pager # [phone]

## 2015-12-23 LAB — GLUCOSE, CAPILLARY
GLUCOSE-CAPILLARY: 126 mg/dL — AB (ref 65–99)
GLUCOSE-CAPILLARY: 126 mg/dL — AB (ref 65–99)
GLUCOSE-CAPILLARY: 130 mg/dL — AB (ref 65–99)
Glucose-Capillary: 120 mg/dL — ABNORMAL HIGH (ref 65–99)
Glucose-Capillary: 127 mg/dL — ABNORMAL HIGH (ref 65–99)
Glucose-Capillary: 124 mg/dL — ABNORMAL HIGH (ref 65–99)

## 2015-12-23 LAB — CBC
HCT: 41.9 % (ref 39.0–52.0)
Hemoglobin: 13.1 g/dL (ref 13.0–17.0)
MCH: 32.2 pg (ref 26.0–34.0)
MCHC: 31.3 g/dL (ref 30.0–36.0)
MCV: 102.9 fL — ABNORMAL HIGH (ref 78.0–100.0)
PLATELETS: 120 10*3/uL — AB (ref 150–400)
RBC: 4.07 MIL/uL — ABNORMAL LOW (ref 4.22–5.81)
RDW: 16 % — AB (ref 11.5–15.5)
WBC: 11.1 10*3/uL — AB (ref 4.0–10.5)

## 2015-12-23 NOTE — Progress Notes (Addendum)
PROGRESS NOTE                                                                                                                                                                                                             Patient Demographics:    Louis Acosta, is a 82 y.o. male, DOB - 13-Oct-1931, VAP:[id number]  Admit date - 12/06/2015   Admitting Physician Robbie Lis, MD  Outpatient Primary MD for the patient is [name], MD  LOS - 10  Outpatient Specialists:none  Chief Complaint  Patient presents with  . Weakness       Brief Narrative   82 year old male with hypertension, chronic combined CHF with decompensation, pacemaker status, diabetes mellitus on insulin, dyslipidemia, she kidney stage III presented from home with generalized weakness and increasing shortness of breath. In the ED he was hypotensive with blood pressure of 73/58 mmHg, tachycardic in the low 100s, tachypnea and febrile to 100.58F and hypoxic on room air at 72%, improved to 93% with 4 L nasal cannula. Blood work showed significant leukocytosis of 22.7, creatinine 1.86 and lactic acid of 2.7. Patient met criteria for sepsis. Chest x-ray showed volume loss and basilar opacity in left hemithorax suspicious of infiltrate. Patient requiring step down monitoring given ongoing hypoxic respiratory failure.   Subjective:   On ventimask at 10L / min. More oriented today. Fever of 100.35F overnight   Assessment  & Plan :    Principal Problem:   Acute respiratory failure with hypoxia (HCC) Suspected due to decompensated respiratory failure and community-acquired pneumonia.  completed 8 days of antibiotic course. Chest x-ray repeated  showing pulmonary vascular congestion. Continue scheduled lasix with good diuresis. -May need a VQ scan to rule out PE if dyspnea unimproved. -suspect patient is also having silent aspiration. Ordered MBS as per SLP  recommendation.  Patient appears quite frail and deconditioned.  palliative care consulted for goals of care discussion, meeting scheduled with daughter this afternoon.   Active Problems: Sepsis secondary to healthcare associated pneumonia  resolved , but patient still in respiratory failure. completed 8 days of antibiotics. Suspect silent aspiration. MBS ordered. Febrile overnight. Hold further abx.    Acute on chronic combined systolic and diastolic CHF (congestive heart failure) ([place]) 2-D echo with EF of 15%. I will start IV Lasix 40 mg every 12 hours. Monitor  strict I/O and daily weight.   Essential hypertension, benign Stable without medications.  CKD (chronic kidney disease) stage 3, GFR 30-59 ml/min Baseline of 1.9. Improved. Monitor with IV diuresis.  Anemia of chronic kidney disease Stable.  Controlled diabetes mellitus with diabetic nephropathy, with long-term current use of insulin (HCC) - CBG stable on sliding scale coverage.     Diabetic neuropathy Hold Neurontin given somnolence and delirium.    Hypothyroidism - Continue Synthroid  Dyslipidemia associated with type 2 diabetes mellitus (HCC) Continue statin.  Elevated troponin Secondary to demand ischemia. Low EF with diffuse hypokinesis on 2-D echo.   Hyperkalemia Should improve with diuresis. Monitor in am.  Acute encephalopathy Per wife lately he has been having visual hallucinations. Patient is somnolent and delirious. ? Hypercapnia. Monitor for now.    Code Status : DO NOT RESUSCITATE  Family Communication  : Discussed in detail with wife and daughter on 10/5  Disposition Plan  : possibly transfer to med surg after palliative care [place] discussion todfay  Barriers For Discharge : Active symptoms  Consults  :   PC CM Palliative care  Procedures  : 2-D echo  DVT Prophylaxis  :  Lovenox -   Lab Results  Component Value Date   PLT 120 (L) 12/23/2015    Antibiotics  :      Anti-infectives    Start     Dose/Rate Route Frequency Ordered Stop   12/18/15 1800  ceFEPIme (MAXIPIME) 1 g in dextrose 5 % 50 mL IVPB  Status:  Discontinued     1 g 100 mL/hr over 30 Minutes Intravenous Every 12 hours 12/18/15 1031 12/21/15 1038   12/17/15 1400  ceFEPIme (MAXIPIME) 1 g in dextrose 5 % 50 mL IVPB  Status:  Discontinued     1 g 100 mL/hr over 30 Minutes Intravenous Every 8 hours 12/17/15 0828 12/18/15 1031   12/16/15 1600  ceFEPIme (MAXIPIME) 1 g in dextrose 5 % 50 mL IVPB  Status:  Discontinued     1 g 100 mL/hr over 30 Minutes Intravenous Every 12 hours 12/16/15 1356 12/17/15 0828   12/15/15 1700  levofloxacin (LEVAQUIN) IVPB 750 mg  Status:  Discontinued     750 mg 100 mL/hr over 90 Minutes Intravenous Every 48 hours 11/23/2015 1807 12/07/2015 1830   12/14/15 1700  vancomycin (VANCOCIN) 1,250 mg in sodium chloride 0.9 % 250 mL IVPB  Status:  Discontinued     1,250 mg 166.7 mL/hr over 90 Minutes Intravenous Every 24 hours 12/03/2015 1807 12/12/2015 1829   12/14/15 1030  fluconazole (DIFLUCAN) IVPB 100 mg     100 mg 50 mL/hr over 60 Minutes Intravenous  Once 12/14/15 0932 12/14/15 1142   12/14/15 0100  ceFEPIme (MAXIPIME) 1 g in dextrose 5 % 50 mL IVPB  Status:  Discontinued     1 g 100 mL/hr over 30 Minutes Intravenous Every 24 hours 11/28/2015 1956 12/16/15 1355   12/16/2015 1830  ceFEPIme (MAXIPIME) 2 g in dextrose 5 % 50 mL IVPB  Status:  Discontinued     2 g 100 mL/hr over 30 Minutes Intravenous  Once 12/18/2015 1829 11/30/2015 1830   12/11/2015 1830  vancomycin (VANCOCIN) IVPB 1000 mg/200 mL premix  Status:  Discontinued     1,000 mg 200 mL/hr over 60 Minutes Intravenous  Once 12/07/2015 1829 12/08/2015 2001   11/20/2015 1815  vancomycin (VANCOCIN) 500 mg in sodium chloride 0.9 % 100 mL IVPB  Status:  Discontinued     500  mg 100 mL/hr over 60 Minutes Intravenous NOW 12/03/2015 1807 12/02/2015 1829   12/04/2015 1700  levofloxacin (LEVAQUIN) IVPB 750 mg  Status:  Discontinued     750  mg 100 mL/hr over 90 Minutes Intravenous  Once 12/09/2015 1657 11/30/2015 1830   11/25/2015 1700  vancomycin (VANCOCIN) IVPB 1000 mg/200 mL premix     1,000 mg 200 mL/hr over 60 Minutes Intravenous  Once 12/12/2015 1657 11/30/2015 1819        Objective:   Vitals:   12/23/15 0600 12/23/15 0800 12/23/15 0804 12/23/15 1138  BP:  (!) 86/64 109/71   Pulse:      Resp:  (!) 31 (!) 31   Temp:  (!) 100.5 F (38.1 C)  99.2 F (37.3 C)  TempSrc:  Axillary  Axillary  SpO2:  (!) 87% 94%   Weight: 83.4 kg (183 lb 13.8 oz)     Height:        Wt Readings from Last 3 Encounters:  12/23/15 83.4 kg (183 lb 13.8 oz)  08/11/15 85.4 kg (188 lb 3.2 oz)  06/16/15 93 kg (205 lb)     Intake/Output Summary (Last 24 hours) at 12/23/15 1254 Last data filed at 12/23/15 1130  Gross per 24 hour  Intake              230 ml  Output             1750 ml  Net            -1520 ml     Physical Exam  Gen:  arousable, confused, on venti mask HEENT: moist mucosa, supple neck Chest: Bilateral breath sounds CVS: N S1&S2, no murmurs GI: soft, NT, ND, BS+ Musculoskeletal: warm, no edema     Data Review:    CBC  Recent Labs Lab 12/18/15 1125 12/19/15 0339 12/20/15 0330 12/22/15 0334 12/23/15 0338  WBC 9.0 8.7 9.4 8.8 11.1*  HGB 12.6* 12.0* 12.2* 12.4* 13.1  HCT 39.7 36.5* 38.9* 39.5 41.9  PLT 140* 139* 158 153 120*  MCV 101.8* 98.6 101.6* 101.8* 102.9*  MCH 32.3 32.4 31.9 32.0 32.2  MCHC 31.7 32.9 31.4 31.4 31.3  RDW 15.0 14.8 15.3 15.7* 16.0*  LYMPHSABS  --   --  1.0  --   --   MONOABS  --   --  0.8  --   --   EOSABS  --   --  0.1  --   --   BASOSABS  --   --  0.0  --   --     Chemistries   Recent Labs Lab 12/18/15 0515 12/18/15 1125 12/19/15 0339 12/20/15 0330 12/22/15 0334  NA 134* 135 136 141 143  K 5.8* 5.6* 5.3* 5.1 5.3*  CL 97* 96* 98* 102 105  CO2 29 32 '31 28 29  '$ GLUCOSE 132* 115* 76 91 124*  BUN 59* 62* 64* 60* 67*  CREATININE 1.42* 1.39* 1.27* 1.23 1.48*  CALCIUM 9.5 9.7  9.7 9.7 9.9  MG  --   --   --  2.2  --    ------------------------------------------------------------------------------------------------------------------ No results for input(s): CHOL, HDL, LDLCALC, TRIG, CHOLHDL, LDLDIRECT in the last 72 hours.  No results found for: HGBA1C ------------------------------------------------------------------------------------------------------------------ No results for input(s): TSH, T4TOTAL, T3FREE, THYROIDAB in the last 72 hours.  Invalid input(s): FREET3 ------------------------------------------------------------------------------------------------------------------ No results for input(s): VITAMINB12, FOLATE, FERRITIN, TIBC, IRON, RETICCTPCT in the last 72 hours.  Coagulation profile No results for input(s): INR, PROTIME in the last  168 hours.  No results for input(s): DDIMER in the last 72 hours.  Cardiac Enzymes No results for input(s): CKMB, TROPONINI, MYOGLOBIN in the last 168 hours.  Invalid input(s): CK ------------------------------------------------------------------------------------------------------------------    Component Value Date/Time   BNP 3,356.2 (H) 12/22/2015 0334    Inpatient Medications  Scheduled Meds: . fluticasone  2 spray Each Nare Daily  . furosemide  40 mg Intravenous BID  . insulin aspart  0-9 Units Subcutaneous Q4H  . ipratropium  0.5 mg Nebulization BID  . levalbuterol  1.25 mg Nebulization BID  . mouth rinse  15 mL Mouth Rinse BID  . polyvinyl alcohol  1 drop Left Eye Daily  . prednisoLONE acetate  1 drop Right Eye QHS  . sodium chloride  1,000 mL Intravenous Once   And  . sodium chloride  1,000 mL Intravenous Once   Continuous Infusions: . sodium chloride 10 mL/hr at 12/23/15 1100   PRN Meds:.acetaminophen, acetaminophen, chlorpheniramine-HYDROcodone, guaiFENesin-dextromethorphan, ipratropium, levalbuterol, ondansetron **OR** ondansetron (ZOFRAN) IV, RESOURCE THICKENUP CLEAR  Micro  Results Recent Results (from the past 240 hour(s))  Blood Culture (routine x 2)     Status: None   Collection Time: 11/25/2015  4:00 PM  Result Value Ref Range Status   Specimen Description BLOOD LEFT FOREARM  Final   Special Requests IN PEDIATRIC BOTTLE  2 CC  Final   Culture   Final    NO GROWTH 5 DAYS Performed at [facility]    Report Status 12/18/2015 FINAL  Final  Blood Culture (routine x 2)     Status: None   Collection Time: 12/11/2015  4:14 PM  Result Value Ref Range Status   Specimen Description BLOOD LEFT ANTECUBITAL  Final   Special Requests BOTTLES DRAWN AEROBIC AND ANAEROBIC 5 CC EA  Final   Culture   Final    NO GROWTH 5 DAYS Performed at [facility]    Report Status 12/18/2015 FINAL  Final  MRSA PCR Screening     Status: None   Collection Time: 12/16/2015  6:25 PM  Result Value Ref Range Status   MRSA by PCR NEGATIVE NEGATIVE Final    Comment:        The GeneXpert MRSA Assay (FDA approved for NASAL specimens only), is one component of a comprehensive MRSA colonization surveillance program. It is not intended to diagnose MRSA infection nor to guide or monitor treatment for MRSA infections.   MRSA PCR Screening     Status: None   Collection Time: 12/18/15 10:29 AM  Result Value Ref Range Status   MRSA by PCR NEGATIVE NEGATIVE Final    Comment:        The GeneXpert MRSA Assay (FDA approved for NASAL specimens only), is one component of a comprehensive MRSA colonization surveillance program. It is not intended to diagnose MRSA infection nor to guide or monitor treatment for MRSA infections.     Radiology Reports Dg Chest Port 1 View  Result Date: 12/22/2015 CLINICAL DATA:  Shortness of breath. EXAM: PORTABLE CHEST 1 VIEW COMPARISON:  12/18/2015. FINDINGS: AICD noted in stable position. Cardiomegaly with pulmonary vascular prominence and bilateral interstitial prominence consistent with congestive heart failure. Left lower lobe atelectasis  and/or infiltrate. Small bilateral pleural effusions. IMPRESSION: 1.  AICD noted in good anatomic position. 2.  Congestive heart failure with pulmonary interstitial edema . 3.  Left lower lobe atelectasis and/or infiltrate. Electronically Signed   By: Marcello Moores  Register   On: 12/22/2015 09:07   Dg Chest  Port 1 View  Result Date: 12/18/2015 CLINICAL DATA:  Hypoxia EXAM: PORTABLE CHEST 1 VIEW COMPARISON:  12/17/2015 FINDINGS: Left subclavian AICD device and leads are stable. Leads are intact. Moderate cardiomegaly. Left base opacified. Hazy airspace disease at the right base. Vascular congestion. No evidence of Kerley B line. IMPRESSION: Stable dense opacity at the left base. Hazy airspace disease at the right base. Findings are not significantly changed. Cardiomegaly and vascular congestion. Electronically Signed   By: Marybelle Killings M.D.   On: 12/18/2015 10:11   Dg Chest Port 1 View  Result Date: 11/21/2015 CLINICAL DATA:  Chronic shortness of breath. On home oxygen. History of hypertension and diabetes. EXAM: PORTABLE CHEST 1 VIEW COMPARISON:  12/23/2012. FINDINGS: 1633 hours. Mild patient rotation to the left. Left subclavian AICD leads appear grossly unchanged. Old leads are present. The heart is enlarged. There is aortic atherosclerosis. There is progressive volume loss and opacity inferiorly in the left hemithorax with a probable adjacent small left pleural effusion. There is mild overall vascular congestion without focal opacity in the right lung. The bones appear unchanged. IMPRESSION: 1. Progressive volume loss and basilar opacity in the left hemithorax suspicious for atelectasis or infiltrate. Probable adjacent small left pleural effusion. Followup PA and lateral chest X-ray is recommended in 3-4 weeks (following trial of antibiotic therapy if clinically warranted) to ensure resolution and exclude underlying malignancy. 2. Cardiomegaly with chronic vascular congestion. Electronically Signed   By:  Richardean Sale M.D.   On: 11/24/2015 16:57    Time Spent in minutes  35   Louellen Molder M.D on 12/23/2015 at 12:54 PM  Between 7am to 7pm - Pager - [phone]  After 7pm go to [email or web address] - password [facility]  [facility] -  Office  [phone]

## 2015-12-23 NOTE — Progress Notes (Signed)
Date:  December 23, 2015 Chart reviewed for concurrent status and case management needs. Will continue to follow the patient for status change: remains on 40% fi02 with venturi mask. Discharge Planning: following for needs Expected discharge date: [id number] Velva Harman, BSN, [place], [place]

## 2015-12-23 NOTE — Consult Note (Signed)
Consultation Note Date: 12/23/2015   Patient Name: Louis Acosta  DOB: 1931-09-27  MRN: [id number]  Age / Sex: 82 y.o., male  PCP: Josetta Huddle, MD Referring Physician: Louellen Molder, MD  Reason for Consultation: Establishing goals of care  HPI/Patient Profile: 82 y.o. male    admitted on 11/24/2015   Clinical Assessment and Goals of Care:  82 year old male with hypertension, chronic combined CHF with decompensation, pacemaker status, diabetes mellitus on insulin, dyslipidemia, she kidney stage III presented from home with generalized weakness and increasing shortness of breath. In the ED he was hypotensive with blood pressure of 73/58 mmHg, tachycardic in the low 100s, tachypnea and febrile to 100.31F and hypoxic on room air at 72%, improved to 93% with 4 L nasal cannula. Blood work showed significant leukocytosis of 22.7, creatinine 1.86 and lactic acid of 2.7. Patient met criteria for sepsis. Chest x-ray showed volume loss and basilar opacity in left hemithorax suspicious of infiltrate. Patient requiring step down monitoring given ongoing hypoxic respiratory failure.  Patient has since remained admitted to the hospital, currently in step down unit for acute hypoxic respiratory failure. He has completed 8 days of antibiotics, recent ECHO has shown EF 15%. He has had ongoing high O2 requirements and confused, on Lasix for volume overload. Palliative care has been consulted for goals of care discussions.   Patient is an elderly appearing gentleman resting in bed, he has a Ventimask on. He is able to open his eyes when his name is called out loudly. He is able to answer very few yes/no questions appropriately.   Call placed and discussed with daughter Louis Acosta who arrived at the bedside for a family meeting this afternoon, I have introduced myself and palliative care as follows: Palliative medicine is specialized  medical care for people living with serious illness. It focuses on providing relief from the symptoms and stress of a serious illness. The goal is to improve quality of life for both the patient and the family.  Brief life review performed, patient lives at home with his wife and son. He is at baseline able to ambulate with a walker. He recently completed home physical therapy. He follows up with his heart specialists regularly. Just 24 to 48 hours prior to this hospitalization, he developed sudden onset leg weakness and was brought into the hospital where he was found to have Pneumonia and sepsis.   Patient's wife has recently received chemotherapy and was not able to come into the hospital for this family meeting.   Discussed with daughter Louis Acosta and Lisa's husband regarding the patient's current condition. He remains tenuous, hemodynamically unstable. Daughter Louis Acosta states that patient's mother in law had undergone CPR and resuscitative attempt several decades ago, patient has always talked about how he would not want to undergo aggressive measures and CPR etc at end of life.   Discussed with daughter Louis Acosta that the patient already has an ICD. We decided on DNI. We decided on continuing current measures and if he improves, then consider SNF rehab with  palliative on d/c. If he doesn't improve, then we will have additional discussions regarding comfort measures, de activation of his ICD and hospice care. All questions answered to the best of my ability, we will follow along.     NEXT OF KIN  daughter Louis Acosta, patient is married, has a wife, but she has reportedly received chemotherapy recently and is not able to come to the hospital.   SUMMARY OF RECOMMENDATIONS    DNI. Patient has an AICD. Discussed with daughter about de activating ICD if patient does not improve within the next 24-48 hours.   Time trail of current therapies for the next 24-48 hours. Anticipate another family meeting with both daughter  and son ( we will include wife over the phone) in the next 48 hours. At that time, we will discuss in detail regarding comfort measures, de activation of ICD and also hospice if patient is not able to wean off such high O2 requirements, if he continues to be confused, or if he continues to remain hemodynamically unstable.   To remain on O2, Lasix. Off Abx for now. Hopefully can participate in a swallow eval soon. Daughter states patient did not have any swallowing/aspiration issues prior to this hospitalization.    Code Status/Advance Care Planning:  DNR    Symptom Management:    continue current measures.   Palliative Prophylaxis:   Bowel Regimen   Psycho-social/Spiritual:   Desire for further Chaplaincy support:no  Additional Recommendations: Caregiving  Support/Resources  Prognosis:   Unable to determine, but appears guarded, given patient's multiple acute and underlying conditions.   Discharge Planning: To Be Determined, according to daughter, patient has never been in a SNF before.       Primary Diagnoses: Present on Admission: . Acute respiratory failure with hypoxia ([place]) . Essential hypertension, benign . CARDIAC PACEMAKER-[employer] . Lobar pneumonia, unspecified organism ([place]) . CKD (chronic kidney disease) stage 3, GFR 30-59 ml/min . Dyslipidemia associated with type 2 diabetes mellitus ([place]) . Elevated troponin . Leukocytosis . Sepsis due to pneumonia ([place]) . Acute on chronic combined systolic and diastolic CHF (congestive heart failure) ([place])   I have reviewed the medical record, interviewed the patient and family, and examined the patient. The following aspects are pertinent.  Past Medical History:  Diagnosis Date  . Anginal pain ([place])    within the last 2 wks has taken Ntg 1 time  . Arthritis   . Cardiomyopathy    broken 3x's  . CHF (congestive heart failure) ([facility])    takes Furosemide daily  . Chronic back pain   . Confusion    short term  memory loss  . Diabetes mellitus    type 2;takes Lantus nightly and Humalog prn  . Diverticulosis   . Dysrhythmia    HX OF COMPLETE HEART BLOCK;takes Metoprolol daily  . Enlarged prostate    takes [employer]  . Glaucoma    uses several drops  . H/O hiatal hernia   . History of colon polyps   . History of fracture of nose   . History of shingles   . History of staph infection    50+yrs ago  . Hyperlipidemia    takes Simvastatin daily  . Hypertension    takes Losartan daily  . Joint pain   . Nocturia   . Pacemaker    St Jude  . PONV (postoperative nausea and vomiting)   . Seasonal allergies    uses Flonase prn and takes Claritin prn  . Shortness  of breath    lying and with exertion  . Skin spots, red    Social History   Social History  . Marital status: Married    Spouse name: N/A  . Number of children: N/A  . Years of education: N/A   Occupational History  . retired    Social History Main Topics  . Smoking status: Former Smoker    Quit date: 03/20/1968  . Smokeless tobacco: Never Used  . Alcohol use No  . Drug use: No  . Sexual activity: No   Other Topics Concern  . None   Social History Narrative  . None   Family History  Problem Relation Age of Onset  . Heart disease Mother    Scheduled Meds: . fluticasone  2 spray Each Nare Daily  . furosemide  40 mg Intravenous BID  . insulin aspart  0-9 Units Subcutaneous Q4H  . ipratropium  0.5 mg Nebulization BID  . levalbuterol  1.25 mg Nebulization BID  . mouth rinse  15 mL Mouth Rinse BID  . polyvinyl alcohol  1 drop Left Eye Daily  . prednisoLONE acetate  1 drop Right Eye QHS  . sodium chloride  1,000 mL Intravenous Once   And  . sodium chloride  1,000 mL Intravenous Once   Continuous Infusions: . sodium chloride 10 mL/hr at 12/23/15 1300   PRN Meds:.acetaminophen, acetaminophen, chlorpheniramine-HYDROcodone, guaiFENesin-dextromethorphan, ipratropium, levalbuterol, ondansetron **OR** ondansetron  (ZOFRAN) IV, RESOURCE THICKENUP CLEAR Medications Prior to Admission:  Prior to Admission medications   Medication Sig Start Date End Date Taking? Authorizing Provider  B Complex Vitamins (VITAMIN-B COMPLEX PO) Take 1 tablet by mouth daily.    Yes Historical Provider, MD  dorzolamide (TRUSOPT) 2 % ophthalmic solution Place 1 drop into the left eye 3 (three) times daily.  05/04/12  Yes Historical Provider, MD  fluPHENAZine (PROLIXIN) 2.5 MG tablet Take 2.5 mg by mouth 2 (two) times daily.  11/29/15  Yes Historical Provider, MD  fluticasone (FLONASE) 50 MCG/ACT nasal spray Place 2 sprays into the nose daily.  05/29/12  Yes Historical Provider, MD  furosemide (LASIX) 40 MG tablet Take 40-120 mg by mouth 3 (three) times daily. Take 3 tablets in the am, Take 2 tablets in the pm & Take 1 tablet qhs   Yes Historical Provider, MD  insulin lispro (HUMALOG) 100 UNIT/ML injection Inject 15-20 Units into the skin daily as needed for high blood sugar.    Yes Historical Provider, MD  latanoprost (XALATAN) 0.005 % ophthalmic solution Place 1 drop into the left eye at bedtime.  08/04/15  Yes Historical Provider, MD  levothyroxine (SYNTHROID, LEVOTHROID) 25 MCG tablet Take 25 mcg by mouth daily before breakfast.  10/11/15  Yes Historical Provider, MD  losartan (COZAAR) 25 MG tablet TAKE 1 TABLET BY MOUTH DAILY 07/28/13  Yes Corky Crafts, MD  metoprolol (LOPRESSOR) 50 MG tablet Take 50 mg by mouth 2 (two) times daily.  12/02/15  Yes Historical Provider, MD  Misc Natural Products (OSTEO BI-FLEX TRIPLE STRENGTH PO) Take 2 tablets by mouth daily.    Yes Historical Provider, MD  Multiple Vitamins-Minerals (MULTIVITAMIN WITH MINERALS) tablet Take 1 tablet by mouth daily.    Yes Historical Provider, MD  Omega-3 1400 MG CAPS Take 2 capsules by mouth daily.   Yes Historical Provider, MD  Polyethyl Glycol-Propyl Glycol (SYSTANE ULTRA OP) Place 1 drop into the left eye every morning.    Yes Historical Provider, MD  potassium  gluconate 595 MG TABS Take  595 mg by mouth daily.   Yes Historical Provider, MD  prednisoLONE acetate (PRED FORTE) 1 % ophthalmic suspension Place 1 drop into the right eye at bedtime.   Yes Historical Provider, MD  Saw Palmetto 450 MG CAPS Take 450 mg by mouth 2 (two) times daily.   Yes Historical Provider, MD  simvastatin (ZOCOR) 40 MG tablet Take 40 mg by mouth every evening.    Yes Historical Provider, MD  spironolactone (ALDACTONE) 25 MG tablet Take 25 mg by mouth daily. 1 IN A.M. 07/21/15  Yes Historical Provider, MD  TOUJEO SOLOSTAR 300 UNIT/ML SOPN Inject 30 Units into the skin at bedtime.  04/29/14  Yes Historical Provider, MD  ACCU-CHEK AVIVA PLUS test strip 1 each by Other route as needed.  07/21/15   Historical Provider, MD  ACCU-CHEK FASTCLIX LANCETS MISC Apply 1 each topically as directed.  07/21/15   Historical Provider, MD  acetaminophen (TYLENOL) 650 MG CR tablet Take 650 mg by mouth every 8 (eight) hours as needed for pain.    Historical Provider, MD  gabapentin (NEURONTIN) 100 MG capsule Take 100-200 mg by mouth 3 (three) times daily.    Historical Provider, MD  loratadine (CLARITIN) 10 MG tablet Take 10 mg by mouth daily as needed for allergies.     Historical Provider, MD  NITROSTAT 0.4 MG SL tablet TAKE 1 TABLET UNDER THE TONGUE AS NEEDEDFOR CHEST PAIN (MAY REPEAT EVERY 5 MIN X3 DOSES, CALL 911 IF NO RELIEF.) 07/16/15   Jettie Booze, MD  traMADol (ULTRAM) 50 MG tablet Take 50 mg by mouth every 6 (six) hours as needed for moderate pain or severe pain.  12/01/15   Historical Provider, MD   Allergies  Allergen Reactions  . Clindamycin/Lincomycin Nausea And Vomiting   Review of Systems + for some confusion recently, prior to this hospitalization.   Physical Exam Opens eyes to loud voice command, is able to answer few yes/no type questions appropriately.  Clear anteriorly, diminished bases.  Regular Edema bilateral LE Abdomen soft  Vital Signs: BP (!) 102/59   Pulse (!)  109   Temp 99.2 F (37.3 C) (Axillary)   Resp 15   Ht '5\' 11"'$  (1.803 m)   Wt 83.4 kg (183 lb 13.8 oz)   SpO2 98%   BMI 25.64 kg/m  Pain Assessment: PAINAD POSS *See Group Information*: 1-Acceptable,Awake and alert Pain Score: 0-No pain   SpO2: SpO2: 98 % O2 Device:SpO2: 98 % O2 Flow Rate: .O2 Flow Rate (L/min): 10 L/min  IO: Intake/output summary:  Intake/Output Summary (Last 24 hours) at 12/23/15 1315 Last data filed at 12/23/15 1300  Gross per 24 hour  Intake              240 ml  Output             1750 ml  Net            -1510 ml    LBM: Last BM Date: 12/01/2015 Baseline Weight: Weight: 86.2 kg (190 lb) Most recent weight: Weight: 83.4 kg (183 lb 13.8 oz)     Palliative Assessment/Data:   Flowsheet Rows   Flowsheet Row Most Recent Value  Intake Tab  Referral Department  Hospitalist  Unit at Time of Referral  ICU  Palliative Care Primary Diagnosis  Pulmonary  Palliative Care Type  New Palliative care  Reason for referral  Non-pain Symptom, Clarify Goals of Care  Date first seen by Palliative Care  12/23/15  Clinical Assessment  Palliative Performance Scale Score  20%  Pain Max last 24 hours  6  Pain Min Last 24 hours  5  Dyspnea Max Last 24 Hours  5  Dyspnea Min Last 24 hours  4  Nausea Max Last 24 Hours  5  Nausea Min Last 24 Hours  4  Psychosocial & Spiritual Assessment  Palliative Care Outcomes  Patient/Family meeting held?  Yes  Who was at the meeting?  patient's daughter Louis Acosta.   Palliative Care Outcomes  Clarified goals of care      Time In:  12 Time Out:  1315 Time Total:  70 min  Greater than 50%  of this time was spent counseling and coordinating care related to the above assessment and plan.  Signed by: Loistine Chance, MD  [phone]  Please contact Palliative Medicine Team phone at [phone] for questions and concerns.  For individual provider: See Shea Evans

## 2015-12-23 NOTE — Progress Notes (Signed)
SLP Cancellation Note  Patient Details Name: Louis Acosta MRN: [id number] DOB: [birth year]   Cancelled treatment:       Reason Eval/Treat Not Completed: Fatigue/lethargy limiting ability to participate  Gabriel Rainwater [name], [name] [phone]  Gabriel Rainwater [name] 12/23/2015, 11:36 AM

## 2015-12-23 NOTE — Care Management Important Message (Signed)
Important Message  Patient Details  Name: Louis Acosta MRN: [id number] Date of Birth: Oct 29, 1931   Medicare Important Message Given:  Yes    Camillo Flaming 12/23/2015, 9:07 AMImportant Message  Patient Details  Name: Louis Acosta MRN: [id number] Date of Birth: 1931-09-07   Medicare Important Message Given:  Yes    Camillo Flaming 12/23/2015, 9:06 AM

## 2015-12-24 LAB — GLUCOSE, CAPILLARY
GLUCOSE-CAPILLARY: 127 mg/dL — AB (ref 65–99)
GLUCOSE-CAPILLARY: 120 mg/dL — AB (ref 65–99)
Glucose-Capillary: 124 mg/dL — ABNORMAL HIGH (ref 65–99)
Glucose-Capillary: 137 mg/dL — ABNORMAL HIGH (ref 65–99)
Glucose-Capillary: 141 mg/dL — ABNORMAL HIGH (ref 65–99)
Glucose-Capillary: 145 mg/dL — ABNORMAL HIGH (ref 65–99)

## 2015-12-24 LAB — BASIC METABOLIC PANEL
ANION GAP: 7 (ref 5–15)
BUN: 85 mg/dL — ABNORMAL HIGH (ref 6–20)
CHLORIDE: 108 mmol/L (ref 101–111)
CO2: 31 mmol/L (ref 22–32)
Calcium: 9.4 mg/dL (ref 8.9–10.3)
Creatinine, Ser: 1.86 mg/dL — ABNORMAL HIGH (ref 0.61–1.24)
GFR calc Af Amer: 37 mL/min — ABNORMAL LOW (ref >60)
GFR calc non Af Amer: 32 mL/min — ABNORMAL LOW (ref >60)
GLUCOSE: 132 mg/dL — AB (ref 65–99)
POTASSIUM: 4.8 mmol/L (ref 3.5–5.1)
Sodium: 146 mmol/L — ABNORMAL HIGH (ref 135–145)

## 2015-12-24 NOTE — Progress Notes (Signed)
SLP Cancellation Note  Patient Details Name: Louis Acosta MRN: [id number] DOB: Sep 28, 1931   Cancelled treatment:       Reason Eval/Treat Not Completed: Fatigue/lethargy limiting ability to participate; nursing deferred d/t pt level of consciousness; will re-attempt at a later date if pt able   Charle Clear,PAT, M.S., CCC-SLP 12/24/2015, 8:38 AM

## 2015-12-24 NOTE — Progress Notes (Signed)
PROGRESS NOTE                                                                                                                                                                                                             Patient Demographics:    Louis Acosta, is a 82 y.o. male, DOB - 10-24-1931, MEQ:[id number]  Admit date - 12/14/2015   Admitting Physician Robbie Lis, MD  Outpatient Primary MD for the patient is [name], MD  LOS - 11  Outpatient Specialists:none  Chief Complaint  Patient presents with  . Weakness       Brief Narrative   82 year old male with hypertension, chronic combined CHF with decompensation, pacemaker status, diabetes mellitus on insulin, dyslipidemia, she kidney stage III presented from home with generalized weakness and increasing shortness of breath. In the ED he was hypotensive with blood pressure of 73/58 mmHg, tachycardic in the low 100s, tachypnea and febrile to 100.18F and hypoxic on room air at 72%, improved to 93% with 4 L nasal cannula. Blood work showed significant leukocytosis of 22.7, creatinine 1.86 and lactic acid of 2.7. Patient met criteria for sepsis. Chest x-ray showed volume loss and basilar opacity in left hemithorax suspicious of infiltrate. Patient requiring step down monitoring given ongoing hypoxic respiratory failure.   Subjective:   On ventimask at 10L / min.Still weak and frail and unable to participate in swallow evaluation.   Assessment  & Plan :    Principal Problem:   Acute respiratory failure with hypoxia (HCC) Suspected due to decompensated respiratory failure and community-acquired pneumonia.  completed 8 days of antibiotic course. Chest x-ray repeated  showing pulmonary vascular congestion. Continue scheduled lasix with good diuresis. -suspect patient is also having silent aspiration. Unable to tolerate MBS at this time.  Palliative care  consulted for goals of care discussion. They met with patient's daughter who wanted to involve patient's wife and son to discuss further goals of care if not improved in the next 24 hours (this would include deactivating ICD and hospice options)   Active Problems: Sepsis secondary to healthcare associated pneumonia  resolved , but still in respiratory failure. completed 8 days of antibiotics. Suspect silent aspiration. Holding off further antibiotics. Unable to tolerate MBS    Acute on chronic combined systolic and diastolic CHF (congestive heart failure) ([place]) 2-D echo with  EF of 15%. Continue IV Lasix every 12 hours. Negative balance of 4 L.   Essential hypertension, benign Stable without medications.  CKD (chronic kidney disease) stage 3, GFR 30-59 ml/min Baseline of 1.9. . Monitor with IV diuresis.  Anemia of chronic kidney disease Stable.  Controlled diabetes mellitus with diabetic nephropathy, with long-term current use of insulin (HCC) - CBG stable on sliding scale coverage.     Diabetic neuropathy Hold Neurontin given somnolence and delirium.    Hypothyroidism - Continue Synthroid  Dyslipidemia associated with type 2 diabetes mellitus (HCC) Continue statin.  Elevated troponin Secondary to demand ischemia. Low EF with diffuse hypokinesis on 2-D echo.   Hyperkalemia Should improve with diuresis. Monitor in am.  Acute encephalopathy Per wife lately he has been having visual hallucinations. Patient is somnolent and delirious. ? Hypercapnia.    Code Status : DO NOT RESUSCITATE  Family Communication  : Discussed in detail with wife and daughter on 10/5. I will call daughter again today to update.  Disposition Plan  : Currently in stepdown. Transfer to [facility] yesterday due to apneic spells.    Barriers For Discharge : Active symptoms  Consults  :   PC CM Palliative care  Procedures  : 2-D echo  DVT Prophylaxis  :  Lovenox -   Lab  Results  Component Value Date   PLT 120 (L) 12/23/2015    Antibiotics  :    Anti-infectives    Start     Dose/Rate Route Frequency Ordered Stop   12/18/15 1800  ceFEPIme (MAXIPIME) 1 g in dextrose 5 % 50 mL IVPB  Status:  Discontinued     1 g 100 mL/hr over 30 Minutes Intravenous Every 12 hours 12/18/15 1031 12/21/15 1038   12/17/15 1400  ceFEPIme (MAXIPIME) 1 g in dextrose 5 % 50 mL IVPB  Status:  Discontinued     1 g 100 mL/hr over 30 Minutes Intravenous Every 8 hours 12/17/15 0828 12/18/15 1031   12/16/15 1600  ceFEPIme (MAXIPIME) 1 g in dextrose 5 % 50 mL IVPB  Status:  Discontinued     1 g 100 mL/hr over 30 Minutes Intravenous Every 12 hours 12/16/15 1356 12/17/15 0828   12/15/15 1700  levofloxacin (LEVAQUIN) IVPB 750 mg  Status:  Discontinued     750 mg 100 mL/hr over 90 Minutes Intravenous Every 48 hours 12/17/2015 1807 11/23/2015 1830   12/14/15 1700  vancomycin (VANCOCIN) 1,250 mg in sodium chloride 0.9 % 250 mL IVPB  Status:  Discontinued     1,250 mg 166.7 mL/hr over 90 Minutes Intravenous Every 24 hours 11/22/2015 1807 11/29/2015 1829   12/14/15 1030  fluconazole (DIFLUCAN) IVPB 100 mg     100 mg 50 mL/hr over 60 Minutes Intravenous  Once 12/14/15 0932 12/14/15 1142   12/14/15 0100  ceFEPIme (MAXIPIME) 1 g in dextrose 5 % 50 mL IVPB  Status:  Discontinued     1 g 100 mL/hr over 30 Minutes Intravenous Every 24 hours 12/10/2015 1956 12/16/15 1355   11/22/2015 1830  ceFEPIme (MAXIPIME) 2 g in dextrose 5 % 50 mL IVPB  Status:  Discontinued     2 g 100 mL/hr over 30 Minutes Intravenous  Once 12/11/2015 1829 11/24/2015 1830   12/18/2015 1830  vancomycin (VANCOCIN) IVPB 1000 mg/200 mL premix  Status:  Discontinued     1,000 mg 200 mL/hr over 60 Minutes Intravenous  Once 12/16/2015 1829 11/20/2015 2001   11/24/2015 1815  vancomycin (VANCOCIN) 500 mg in sodium  chloride 0.9 % 100 mL IVPB  Status:  Discontinued     500 mg 100 mL/hr over 60 Minutes Intravenous NOW 11/29/2015 1807 12/09/2015 1829    12/07/2015 1700  levofloxacin (LEVAQUIN) IVPB 750 mg  Status:  Discontinued     750 mg 100 mL/hr over 90 Minutes Intravenous  Once 11/20/2015 1657 12/09/2015 1830   12/09/2015 1700  vancomycin (VANCOCIN) IVPB 1000 mg/200 mL premix     1,000 mg 200 mL/hr over 60 Minutes Intravenous  Once 12/07/2015 1657 12/12/2015 1819        Objective:   Vitals:   12/24/15 0932 12/24/15 1000 12/24/15 1100 12/24/15 1200  BP:    105/65  Pulse:      Resp:  (!) 38 (!) 35 (!) 34  Temp:      TempSrc:      SpO2: 93% 91% 96% 93%  Weight:      Height:        Wt Readings from Last 3 Encounters:  12/23/15 83.4 kg (183 lb 13.8 oz)  08/11/15 85.4 kg (188 lb 3.2 oz)  06/16/15 93 kg (205 lb)     Intake/Output Summary (Last 24 hours) at 12/24/15 1236 Last data filed at 12/24/15 1200  Gross per 24 hour  Intake              240 ml  Output             1175 ml  Net             -935 ml     Physical Exam  Gen:  arousable, confused, on venti mask HEENT: moist mucosa, supple neck Chest: Diminished Bilateral breath sounds CVS: N S1&S2, no murmurs GI: soft, NT, ND, BS+ Musculoskeletal: warm, no edema     Data Review:    CBC  Recent Labs Lab 12/18/15 1125 12/19/15 0339 12/20/15 0330 12/22/15 0334 12/23/15 0338  WBC 9.0 8.7 9.4 8.8 11.1*  HGB 12.6* 12.0* 12.2* 12.4* 13.1  HCT 39.7 36.5* 38.9* 39.5 41.9  PLT 140* 139* 158 153 120*  MCV 101.8* 98.6 101.6* 101.8* 102.9*  MCH 32.3 32.4 31.9 32.0 32.2  MCHC 31.7 32.9 31.4 31.4 31.3  RDW 15.0 14.8 15.3 15.7* 16.0*  LYMPHSABS  --   --  1.0  --   --   MONOABS  --   --  0.8  --   --   EOSABS  --   --  0.1  --   --   BASOSABS  --   --  0.0  --   --     Chemistries   Recent Labs Lab 12/18/15 1125 12/19/15 0339 12/20/15 0330 12/22/15 0334 12/24/15 0346  NA 135 136 141 143 146*  K 5.6* 5.3* 5.1 5.3* 4.8  CL 96* 98* 102 105 108  CO2 32 _0 GLUCOSE 115* 76 91 124* 132*  BUN 62* 64* 60* 67* 85*  CREATININE 1.39* 1.27* 1.23 1.48* 1.86*    CALCIUM 9.7 9.7 9.7 9.9 9.4  MG  --   --  2.2  --   --    ------------------------------------------------------------------------------------------------------------------ No results for input(s): CHOL, HDL, LDLCALC, TRIG, CHOLHDL, LDLDIRECT in the last 72 hours.  No results found for: HGBA1C ------------------------------------------------------------------------------------------------------------------ No results for input(s): TSH, T4TOTAL, T3FREE, THYROIDAB in the last 72 hours.  Invalid input(s): FREET3 ------------------------------------------------------------------------------------------------------------------ No results for input(s): VITAMINB12, FOLATE, FERRITIN, TIBC, IRON, RETICCTPCT in the last 72 hours.  Coagulation profile No results for input(s):  INR, PROTIME in the last 168 hours.  No results for input(s): DDIMER in the last 72 hours.  Cardiac Enzymes No results for input(s): CKMB, TROPONINI, MYOGLOBIN in the last 168 hours.  Invalid input(s): CK ------------------------------------------------------------------------------------------------------------------    Component Value Date/Time   BNP 3,356.2 (H) 12/22/2015 0334    Inpatient Medications  Scheduled Meds: . fluticasone  2 spray Each Nare Daily  . furosemide  40 mg Intravenous BID  . insulin aspart  0-9 Units Subcutaneous Q4H  . ipratropium  0.5 mg Nebulization BID  . levalbuterol  1.25 mg Nebulization BID  . mouth rinse  15 mL Mouth Rinse BID  . polyvinyl alcohol  1 drop Left Eye Daily  . prednisoLONE acetate  1 drop Right Eye QHS  . sodium chloride  1,000 mL Intravenous Once   And  . sodium chloride  1,000 mL Intravenous Once   Continuous Infusions: . sodium chloride 10 mL/hr at 12/24/15 1200   PRN Meds:.acetaminophen, acetaminophen, chlorpheniramine-HYDROcodone, guaiFENesin-dextromethorphan, ipratropium, levalbuterol, ondansetron **OR** ondansetron (ZOFRAN) IV, RESOURCE THICKENUP  CLEAR  Micro Results Recent Results (from the past 240 hour(s))  MRSA PCR Screening     Status: None   Collection Time: 12/18/15 10:29 AM  Result Value Ref Range Status   MRSA by PCR NEGATIVE NEGATIVE Final    Comment:        The GeneXpert MRSA Assay (FDA approved for NASAL specimens only), is one component of a comprehensive MRSA colonization surveillance program. It is not intended to diagnose MRSA infection nor to guide or monitor treatment for MRSA infections.     Radiology Reports Dg Chest Port 1 View  Result Date: 12/22/2015 CLINICAL DATA:  Shortness of breath. EXAM: PORTABLE CHEST 1 VIEW COMPARISON:  12/18/2015. FINDINGS: AICD noted in stable position. Cardiomegaly with pulmonary vascular prominence and bilateral interstitial prominence consistent with congestive heart failure. Left lower lobe atelectasis and/or infiltrate. Small bilateral pleural effusions. IMPRESSION: 1.  AICD noted in good anatomic position. 2.  Congestive heart failure with pulmonary interstitial edema . 3.  Left lower lobe atelectasis and/or infiltrate. Electronically Signed   By: Marcello Moores  Register   On: 12/22/2015 09:07   Dg Chest Port 1 View  Result Date: 12/18/2015 CLINICAL DATA:  Hypoxia EXAM: PORTABLE CHEST 1 VIEW COMPARISON:  12/12/2015 FINDINGS: Left subclavian AICD device and leads are stable. Leads are intact. Moderate cardiomegaly. Left base opacified. Hazy airspace disease at the right base. Vascular congestion. No evidence of Kerley B line. IMPRESSION: Stable dense opacity at the left base. Hazy airspace disease at the right base. Findings are not significantly changed. Cardiomegaly and vascular congestion. Electronically Signed   By: Marybelle Killings M.D.   On: 12/18/2015 10:11   Dg Chest Port 1 View  Result Date: 12/05/2015 CLINICAL DATA:  Chronic shortness of breath. On home oxygen. History of hypertension and diabetes. EXAM: PORTABLE CHEST 1 VIEW COMPARISON:  12/23/2012. FINDINGS: 1633 hours.  Mild patient rotation to the left. Left subclavian AICD leads appear grossly unchanged. Old leads are present. The heart is enlarged. There is aortic atherosclerosis. There is progressive volume loss and opacity inferiorly in the left hemithorax with a probable adjacent small left pleural effusion. There is mild overall vascular congestion without focal opacity in the right lung. The bones appear unchanged. IMPRESSION: 1. Progressive volume loss and basilar opacity in the left hemithorax suspicious for atelectasis or infiltrate. Probable adjacent small left pleural effusion. Followup PA and lateral chest X-ray is recommended in 3-4 weeks (following trial of antibiotic  therapy if clinically warranted) to ensure resolution and exclude underlying malignancy. 2. Cardiomegaly with chronic vascular congestion. Electronically Signed   By: Richardean Sale M.D.   On: 12/09/2015 16:57    Time Spent in minutes  35   Louellen Molder M.D on 12/24/2015 at 12:36 PM  Between 7am to 7pm - Pager - [phone]  After 7pm go to [email or web address] - password [facility]  [facility] -  Office  [phone]

## 2015-12-24 NOTE — Progress Notes (Signed)
Nutrition Follow-up  DOCUMENTATION CODES:   Not applicable  INTERVENTION:  - RD will continue to monitor for POC/GOC and associated needs based on the same.   NUTRITION DIAGNOSIS:   Inadequate oral intake related to lethargy/confusion as evidenced by other (see comment) (RN report). -ongoing  GOAL:   Patient will meet greater than or equal to 90% of their needs -unmet  MONITOR:   PO intake, Weight trends, Labs, Skin, I & O's  ASSESSMENT:   82 y.o. male with medical history significant for hypertension, chronic combined CHF, has pacemaker, DM on insulin, dyslipidemia, CKD stage 3 with baseline Cr 1.9 in 05/2015. He comes from home and his son at the bedside provided most of the history . Pt was in his usual state of health, at baseline able to walk with the walker and cane but over past day PTA he was not able to get up without the assistance. He could not ambulate without the assistance and he almost fell. He was also more short of breath especially when trying to get up.  10/6 Palliative Care met with family yesterday afternoon and note from that discussion reviewed. SLP unable to work with pt since previous RD visit, including this AM, d/t ongoing fatigue/lethargy and RN deferment d/t level of consciousness.   No intakes since previous visit. Pt now with 0% intakes for at least 3-4 days. Per chart review, pt weight has trended down with Lasix order in place. Unable to determine how much of weight loss is d/t fluid loss and how much is lean body mass as remain unable to complete physical assessment at this time with respect to pt's comfort. Noted that weight was down 6.6 kg between 10/4 and 10/5 and no new weight today. Pt likely meets criteria for malnutrition but unable to fully confirm at this time.  RD will continue to monitor for POC/GOC and provide interventions as appropriate. Per Palliative note, pt has indicated to family in the past that he would not want life prolonging  measures.  Medications reviewed; 40 mg IV Lasix BID, sliding scale Novolog, PRN Zofran.  Labs reviewed; CBGs: 127-145 mg/dL this AM, Na: 146 mmol/L, BUN: 85 mg/dL, creatinine: 1.86 mg/dL, GFR: 32 mL/min.     10/4 - Per chart review, pt consumed 100% of lunch on 9/26 and 50% of breakfast, 75% of dinner on 9/27.  - No other PO intakes documented since admission.  - No family/visitors present at bedside.  - SLP notes from 10/2, 10/3, and this AM (indicating pt inappropriate for MBS this AM d/t lethargy).  - Spoke with RN who reports that pt was on HFNC at time of SLP but respiratory status has since worsened and he was placed on non-rebreather.  - Physical assessment deferred at this time with respect to pt's comfort.  - Per chart review, weight has had multiple fluctuations since admission. Weight is down 7 lbs (3.4% body weight) since March 2017 which is not significant for time frame.    Diet Order:  DIET DYS 2 Room service appropriate? Yes; Fluid consistency: Nectar Thick  Skin:   Stage 2 R ear and Stage 1 L ear pressure injuries  Last BM:  9/25  Height:   Ht Readings from Last 1 Encounters:  12/18/15 '5\' 11"'$  (1.803 m)    Weight:   Wt Readings from Last 1 Encounters:  12/23/15 183 lb 13.8 oz (83.4 kg)    Ideal Body Weight:  78.18 kg  BMI:  Body mass index is  25.64 kg/m.  Estimated Nutritional Needs:   Kcal:  1400-1600  Protein:  75-85 grams  Fluid:  >/= 1.6 L/day  EDUCATION NEEDS:   No education needs identified at this time    Jarome Matin, MS, RD, LDN Inpatient Clinical Dietitian Pager # [phone] After hours/weekend pager # [phone]

## 2015-12-24 NOTE — Progress Notes (Signed)
Pharmacist Heart Failure Core Measure Documentation  Assessment: Louis Acosta has an EF documented as 15% on 12/14/15 by ECHO.  Rationale: Heart failure patients with left ventricular systolic dysfunction (LVSD) and an EF < 40% should be prescribed an angiotensin converting enzyme inhibitor (ACEI) or angiotensin receptor blocker (ARB) at discharge unless a contraindication is documented in the medical record.  This patient is not currently on an ACEI or ARB for HF.  This note is being placed in the record in order to provide documentation that a contraindication to the use of these agents is present for this encounter.  ACE Inhibitor or Angiotensin Receptor Blocker is contraindicated (specify all that apply)  []   ACEI allergy AND ARB allergy []   Angioedema []   Moderate or severe aortic stenosis []   Hyperkalemia []   Hypotension []   Renal artery stenosis [x]   Worsening renal function, preexisting renal disease or dysfunction  Holding prior to admission ARB for low BP, AKI, and hyperkalemia   [name] 12/24/2015 11:20 AM

## 2015-12-25 DIAGNOSIS — G9340 Encephalopathy, unspecified: Secondary | ICD-10-CM | POA: Diagnosis not present

## 2015-12-25 DIAGNOSIS — R34 Anuria and oliguria: Secondary | ICD-10-CM | POA: Diagnosis present

## 2015-12-25 DIAGNOSIS — T17908A Unspecified foreign body in respiratory tract, part unspecified causing other injury, initial encounter: Secondary | ICD-10-CM | POA: Diagnosis present

## 2015-12-25 LAB — GLUCOSE, CAPILLARY
GLUCOSE-CAPILLARY: 128 mg/dL — AB (ref 65–99)
GLUCOSE-CAPILLARY: 144 mg/dL — AB (ref 65–99)
GLUCOSE-CAPILLARY: 149 mg/dL — AB (ref 65–99)

## 2015-12-25 MED ORDER — GLYCOPYRROLATE 0.2 MG/ML IJ SOLN
0.2000 mg | INTRAMUSCULAR | Status: DC | PRN
  Filled 2015-12-25: qty 1

## 2015-12-25 MED ORDER — HYDROMORPHONE HCL 1 MG/ML IJ SOLN
0.5000 mg | INTRAMUSCULAR | Status: DC | PRN
  Administered 2015-12-25: 0.5 mg via INTRAVENOUS
  Filled 2015-12-25: qty 1

## 2015-12-25 MED ORDER — SODIUM CHLORIDE 0.9 % IV SOLN
INTRAVENOUS | Status: DC
  Administered 2015-12-25: 11:00:00 via INTRAVENOUS

## 2015-12-25 MED ORDER — HALOPERIDOL 0.5 MG PO TABS
0.5000 mg | ORAL_TABLET | ORAL | Status: DC | PRN
  Filled 2015-12-25: qty 1

## 2015-12-25 MED ORDER — BIOTENE DRY MOUTH MT LIQD: 15.0000 mL | OROMUCOSAL | Status: DC | PRN

## 2015-12-25 MED ORDER — HALOPERIDOL LACTATE 2 MG/ML PO CONC
0.5000 mg | ORAL | Status: DC | PRN
  Filled 2015-12-25: qty 0.3

## 2015-12-25 MED ORDER — ACETAMINOPHEN 325 MG PO TABS: 650.0000 mg | ORAL_TABLET | Freq: Four times a day (QID) | ORAL | Status: DC | PRN

## 2015-12-25 MED ORDER — SODIUM CHLORIDE 0.9 % IV BOLUS (SEPSIS)
500.0000 mL | Freq: Once | INTRAVENOUS | Status: AC
  Administered 2015-12-25: 500 mL via INTRAVENOUS

## 2015-12-25 MED ORDER — HALOPERIDOL LACTATE 5 MG/ML IJ SOLN: 0.5000 mg | INTRAMUSCULAR | Status: DC | PRN

## 2015-12-25 MED ORDER — GLYCOPYRROLATE 1 MG PO TABS
1.0000 mg | ORAL_TABLET | ORAL | Status: DC | PRN
  Filled 2015-12-25: qty 1

## 2015-12-25 MED ORDER — DEXTROSE-NACL 5-0.45 % IV SOLN
INTRAVENOUS | Status: DC
  Administered 2015-12-25: 09:00:00 via INTRAVENOUS

## 2015-12-25 MED ORDER — ACETAMINOPHEN 650 MG RE SUPP: 650.0000 mg | Freq: Four times a day (QID) | RECTAL | Status: DC | PRN

## 2015-12-25 MED ORDER — POLYVINYL ALCOHOL 1.4 % OP SOLN
1.0000 [drp] | Freq: Four times a day (QID) | OPHTHALMIC | Status: DC | PRN
  Filled 2015-12-25: qty 15

## 2016-01-19 NOTE — Progress Notes (Signed)
Daily Progress Note   Patient Name: Louis Acosta       Date: 2015/12/29 DOB: 11-19-1931  Age: 82 y.o. MRN#: [id number] Attending Physician: Louellen Molder, MD Primary Care Physician: [name], MD Admit Date: 11/24/2015  Reason for Consultation/Follow-up: Establishing goals of care  Subjective:  not awake, not alert,  Appears to be declining See below.   Length of Stay: 12  Current Medications: Scheduled Meds:  . fluticasone  2 spray Each Nare Daily  . furosemide  40 mg Intravenous BID  . mouth rinse  15 mL Mouth Rinse BID  . polyvinyl alcohol  1 drop Left Eye Daily  . prednisoLONE acetate  1 drop Right Eye QHS  . sodium chloride  1,000 mL Intravenous Once   And  . sodium chloride  1,000 mL Intravenous Once    Continuous Infusions: . sodium chloride Stopped (2015-12-29 0900)  . sodium chloride      PRN Meds: acetaminophen **OR** acetaminophen, antiseptic oral rinse, glycopyrrolate **OR** glycopyrrolate **OR** glycopyrrolate, haloperidol **OR** haloperidol **OR** haloperidol lactate, HYDROmorphone (DILAUDID) injection, ondansetron **OR** ondansetron (ZOFRAN) IV, polyvinyl alcohol  Physical Exam         Unresponsive elderly gentleman resting in bed in Has been Ventimask on Worsening generalized edema, weeping/oozing from upper extremities S1-S2 paced rhythm on the monitor Shallow breathing anterior lung fields Patient is not awake patient is not alert does not open eyes does not follow commands when his name is called Abdomen is soft mildly distended  Vital Signs: BP (!) 88/50   Pulse (!) 104   Temp 99.3 F (37.4 C) (Oral)   Resp (!) 31   Ht '5\' 11"'$  (1.803 m)   Wt 81.1 kg (178 lb 12.7 oz)   SpO2 94%   BMI 24.94 kg/m  SpO2: SpO2: 94 % O2 Device: O2  Device: Venturi Mask O2 Flow Rate: O2 Flow Rate (L/min): 10 L/min  Intake/output summary:  Intake/Output Summary (Last 24 hours) at Dec 29, 2015 1105 Last data filed at 2015-12-29 1000  Gross per 24 hour  Intake           750.83 ml  Output              605 ml  Net           145.83 ml   LBM: Last BM  Date: 12/02/2015 Baseline Weight: Weight: 86.2 kg (190 lb) Most recent weight: Weight: 81.1 kg (178 lb 12.7 oz)       Palliative Assessment/Data:    Flowsheet Rows   Flowsheet Row Most Recent Value  Intake Tab  Referral Department  Hospitalist  Unit at Time of Referral  ICU  Palliative Care Primary Diagnosis  Pulmonary  Palliative Care Type  New Palliative care  Reason for referral  Non-pain Symptom, Clarify Goals of Care  Date first seen by Palliative Care  12/23/15  Clinical Assessment  Palliative Performance Scale Score  10%  Pain Max last 24 hours  6  Pain Min Last 24 hours  5  Dyspnea Max Last 24 Hours  5  Dyspnea Min Last 24 hours  4  Nausea Max Last 24 Hours  5  Nausea Min Last 24 Hours  4  Psychosocial & Spiritual Assessment  Palliative Care Outcomes  Patient/Family meeting held?  Yes  Who was at the meeting?  patient's daughter Louis Acosta.   Palliative Care Outcomes  Clarified goals of care      Patient Active Problem List   Diagnosis Date Noted  . Pressure injury of skin 12/22/2015  . Acute respiratory failure with hypoxia ([place]) 11/25/2015  . Lobar pneumonia, unspecified organism ([place]) 11/29/2015  . CKD (chronic kidney disease) stage 3, GFR 30-59 ml/min 11/30/2015  . Controlled diabetes mellitus with diabetic nephropathy, with long-term current use of insulin ([place]) 12/03/2015  . Dyslipidemia associated with type 2 diabetes mellitus ([place]) 12/09/2015  . Elevated troponin 12/12/2015  . Leukocytosis 12/06/2015  . Sepsis due to pneumonia ([place]) 11/30/2015  . Acute on chronic combined systolic and diastolic CHF (congestive heart failure) ([place]) 12/05/2015  . CARDIAC  PACEMAKER-[place] 05/04/2009  . Essential hypertension, benign 01/05/2009    Palliative Care Assessment & Plan   Patient Profile:  82 year old male with hypertension, chronic combined CHF with decompensation, pacemaker status, diabetes mellitus on insulin, dyslipidemia, she kidney stage III presented from home with generalized weakness and increasing shortness of breath. In the ED he was hypotensive with blood pressure of 73/58 mmHg, tachycardic in the low 100s, tachypnea and febrile to 100.15F and hypoxic on room air at 72%, improved to 93% with 4 L nasal cannula. Blood work showed significant leukocytosis of 22.7, creatinine 1.86 and lactic acid of 2.7. Patient met criteria for sepsis. Chest x-ray showed volume loss and basilar opacity in left hemithorax suspicious of infiltrate. Patient requiring step down monitoring given ongoing hypoxic respiratory failure.  Patient has since remained admitted to the hospital, currently in step down unit for acute hypoxic respiratory failure. He has completed 8 days of antibiotics, recent ECHO has shown EF 15%. He has had ongoing high O2 requirements and confused, on Lasix for volume overload. Palliative care has been consulted for goals of care discussions.    Assessment:  Altered mental status, hypo-active delirium, encephalopathy Recent sepsis secondary to healthcare associated pneumonia Acute on chronic combined systolic and diastolic congestive heart failure ejection fraction 15% Stage III-4 chronic kidney disease worsening serum creatinine over the course of the past 24-48 hours Ongoing acute on chronic hypoxic respiratory failure, recent pneumonia, possible element of aspiration, patient not awake alert enough to tolerate MBS study  Recommendations/Plan:    Family meeting: Patient with ongoing worsening mental status, poorly oriented, now hypotensive and with low-grade temperatures. Patient continues to be on Ventimask, high O2 requirements.  Discussed this with the patient's daughter Louis Acosta, patient's other daughter, patient's son and son-in-law who have  all arrived at the bedside. Discussed about the patient not showing any signs of meaningful recovery. Discussed about the possibility of the patient having entered into the dying process, explained regarding multisystem organ failure. Recommended comfort measures only, aggressive symptom management at end-of-life. Estimated prognosis of hours-days discussed frankly. All questions answered to the best of my ability. Plan: Transfer out of the ICU Bed side RN to please contact medics chronic representative for deactivation of ICD Comfort care/end-of-life order set has been completed Discontinue fingersticks, routine blood work  Goals of Care and Additional Recommendations:  Limitations on Scope of Treatment: Full Comfort Care  Code Status:    Code Status Orders        Start     Ordered   12/27/2015 1045  Do not attempt resuscitation (DNR)  Continuous    Question Answer Comment  In the event of cardiac or respiratory ARREST Do not call a "code blue"   In the event of cardiac or respiratory ARREST Do not perform Intubation, CPR, defibrillation or ACLS   In the event of cardiac or respiratory ARREST Use medication by any route, position, wound care, and other measures to relive pain and suffering. May use oxygen, suction and manual treatment of airway obstruction as needed for comfort.      12-27-2015 1044    Code Status History    Date Active Date Inactive Code Status Order ID Comments User Context   12/18/2015 11:18 AM December 27, 2015 10:44 AM DNR [id number]  Javier Glazier, MD Inpatient   11/20/2015  7:29 PM 12/18/2015 11:18 AM Full Code [id number]  Robbie Lis, MD Inpatient   10/24/2012 12:24 PM 10/30/2012  2:28 PM Full Code [id number]  Gaye Pollack, MD Inpatient       Prognosis:   hours to days.   Discharge Planning:  Anticipated Hospital Death versus transfer to residential  hospice, will see patient again on 12-26-15 to determine if he is stable enough to go to residential hospice.   Care plan was discussed with  Daughter Louis Acosta, Lisa's husband, patient's other daughter, patient's son.   Thank you for allowing the Palliative Medicine Team to assist in the care of this patient.   Time In:  10 Time Out: 1035 Total Time 35 Prolonged Time Billed  no       Greater than 50%  of this time was spent counseling and coordinating care related to the above assessment and plan.  Loistine Chance, MD [phone]  Please contact Palliative Medicine Team phone at [phone] for questions and concerns.

## 2016-01-19 NOTE — Progress Notes (Signed)
Patient was pronounced dead at 1430 by myself and Amy Cheryln Manly.  He was in asystole and no breath sounds present.  Family was present at bedside when death occurred.  Katherine Mantle RN

## 2016-01-19 NOTE — Progress Notes (Signed)
Chaplain paged to room as Mr To was in the rapid process of dying. Family were surrounding the bed and active in comforting him and each other. 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 He seemed to have been waiting for a daughter to arrive from [place]. Went she arrived he seemed less anxious. Chaplain departed when the church pastor arrived to be with the family. At that time monitors indicated little body stats.  Peace to him.  Sallee Lange. Kalina Morabito, [place]

## 2016-01-19 NOTE — Progress Notes (Signed)
SLP Cancellation Note  Patient Details Name: BAMIDELE VILLASENOR MRN: [id number] DOB: January 30, 1932   Cancelled treatment:       Reason Eval/Treat Not Completed: Patient not medically ready;Fatigue/lethargy limiting ability to participate. Spoke with patient's RN Manuela Schwartz), who stated that patient is not appropriate for an MBS today.   Sonia Baller, MA, CCC-SLP 01-12-2016 8:58 AM

## 2016-01-19 NOTE — Progress Notes (Signed)
PROGRESS NOTE                                                                                                                                                                                                             Patient Demographics:    Louis Acosta, is a 82 y.o. male, DOB - [birth year], PJA:[id number]  Admit date - 12/02/2015   Admitting Physician Robbie Lis, MD  Outpatient Primary MD for the patient is [name], MD  LOS - 12  Outpatient Specialists:none  Chief Complaint  Patient presents with  . Weakness       Brief Narrative   82 year old male with hypertension, chronic combined CHF with decompensation, pacemaker status, diabetes mellitus on insulin, dyslipidemia, she kidney stage III presented from home with generalized weakness and increasing shortness of breath. In the ED he was hypotensive with blood pressure of 73/58 mmHg, tachycardic in the low 100s, tachypnea and febrile to 100.62F and hypoxic on room air at 72%, improved to 93% with 4 L nasal cannula. Blood work showed significant leukocytosis of 22.7, creatinine 1.86 and lactic acid of 2.7. Patient met criteria for sepsis. Chest x-ray showed volume loss and basilar opacity in left hemithorax suspicious of infiltrate. Patient requiring step down monitoring given ongoing hypoxic respiratory failure.   Subjective:   Continues to be hypoxic, now hypotensive and febrile. Low urine output.   Assessment  & Plan :    Principal Problem:   Acute respiratory failure with hypoxia (HCC) Secondary to decompensated CHF and community-acquired pneumonia.  completed 8 days of antibiotic course. Still febrile and hypoxic likely aspirating. Symptoms continues to worsen despite aggressive treatment for several days. Palliative care met with family and they wish to make him for comfort. Transfer to medical floor.    Active Problems: Sepsis secondary to  healthcare associated pneumonia Ongoing respiratory failure and fever despite completion of antibiotics. Suspect aspiration. Now for comfort.    Acute on chronic combined systolic and diastolic CHF (congestive heart failure) ([place]) 2-D echo with EF of 15%. Continue IV Lasix every 12 hours. Negative balance of 4 L. holding further diuretic given low blood pressure and going for comfort.  Acute encephalopathy Multifactorial due to sepsis, hypercapnic respiratory failure, pneumonia.  Hypotension Likely due to ongoing sepsis and multiorgan failure. Goal for comfort.  CKD (chronic kidney disease) stage  3, GFR 30-59 ml/min Renal function at baseline.  Anemia of chronic kidney disease Stable.  Controlled diabetes mellitus with diabetic nephropathy, with long-term current use of insulin (HCC) - CBG stable on sliding scale coverage.     Diabetic neuropathy Neurontin discontinued due to delirium    Hypothyroidism - Continue Synthroid  Dyslipidemia associated with type 2 diabetes mellitus ([place]) Continue statin.  Elevated troponin Secondary to demand ischemia. Low EF with diffuse hypokinesis on 2-D echo.   Hyperkalemia Improved with diuresis.      Code Status : DO NOT RESUSCITATE, full comfort  Family Communication  : Discussed plan in detail with patient's daughter Lattie Haw on the phone.  Disposition Plan  : Transfer to medical floor. Suspect hospital death.   Barriers For Discharge : Goal for comfort  Consults  :   PC CM Palliative care  Procedures  : 2-D echo  DVT Prophylaxis  :  Lovenox -   Lab Results  Component Value Date   PLT 120 (L) 12/23/2015    Antibiotics  :    Anti-infectives    Start     Dose/Rate Route Frequency Ordered Stop   12/18/15 1800  ceFEPIme (MAXIPIME) 1 g in dextrose 5 % 50 mL IVPB  Status:  Discontinued     1 g 100 mL/hr over 30 Minutes Intravenous Every 12 hours 12/18/15 1031 12/21/15 1038   12/17/15 1400  ceFEPIme  (MAXIPIME) 1 g in dextrose 5 % 50 mL IVPB  Status:  Discontinued     1 g 100 mL/hr over 30 Minutes Intravenous Every 8 hours 12/17/15 0828 12/18/15 1031   12/16/15 1600  ceFEPIme (MAXIPIME) 1 g in dextrose 5 % 50 mL IVPB  Status:  Discontinued     1 g 100 mL/hr over 30 Minutes Intravenous Every 12 hours 12/16/15 1356 12/17/15 0828   12/15/15 1700  levofloxacin (LEVAQUIN) IVPB 750 mg  Status:  Discontinued     750 mg 100 mL/hr over 90 Minutes Intravenous Every 48 hours 11/27/2015 1807 12/05/2015 1830   12/14/15 1700  vancomycin (VANCOCIN) 1,250 mg in sodium chloride 0.9 % 250 mL IVPB  Status:  Discontinued     1,250 mg 166.7 mL/hr over 90 Minutes Intravenous Every 24 hours 12/02/2015 1807 11/20/2015 1829   12/14/15 1030  fluconazole (DIFLUCAN) IVPB 100 mg     100 mg 50 mL/hr over 60 Minutes Intravenous  Once 12/14/15 0932 12/14/15 1142   12/14/15 0100  ceFEPIme (MAXIPIME) 1 g in dextrose 5 % 50 mL IVPB  Status:  Discontinued     1 g 100 mL/hr over 30 Minutes Intravenous Every 24 hours 12/17/2015 1956 12/16/15 1355   12/10/2015 1830  ceFEPIme (MAXIPIME) 2 g in dextrose 5 % 50 mL IVPB  Status:  Discontinued     2 g 100 mL/hr over 30 Minutes Intravenous  Once 12/15/2015 1829 11/29/2015 1830   12/18/2015 1830  vancomycin (VANCOCIN) IVPB 1000 mg/200 mL premix  Status:  Discontinued     1,000 mg 200 mL/hr over 60 Minutes Intravenous  Once 12/02/2015 1829 11/22/2015 2001   12/05/2015 1815  vancomycin (VANCOCIN) 500 mg in sodium chloride 0.9 % 100 mL IVPB  Status:  Discontinued     500 mg 100 mL/hr over 60 Minutes Intravenous NOW 12/12/2015 1807 11/27/2015 1829   12/03/2015 1700  levofloxacin (LEVAQUIN) IVPB 750 mg  Status:  Discontinued     750 mg 100 mL/hr over 90 Minutes Intravenous  Once 11/20/2015 1657 12/12/2015 1830   11/27/2015  1700  vancomycin (VANCOCIN) IVPB 1000 mg/200 mL premix     1,000 mg 200 mL/hr over 60 Minutes Intravenous  Once 12/05/2015 1657 11/21/2015 1819        Objective:   Vitals:   2016/01/20 0912  January 20, 2016 1000 Jan 20, 2016 1100 01-20-2016 1137  BP: (!) 83/50 (!) 88/50 (!) 91/47   Pulse: (!) 104     Resp: (!) 28 (!) 31 (!) 26   Temp:      TempSrc:      SpO2: 100% 94% 100% 93%  Weight:      Height:        Wt Readings from Last 3 Encounters:  2016-01-20 81.1 kg (178 lb 12.7 oz)  08/11/15 85.4 kg (188 lb 3.2 oz)  06/16/15 93 kg (205 lb)     Intake/Output Summary (Last 24 hours) at Jan 20, 2016 1150 Last data filed at 01/20/2016 1000  Gross per 24 hour  Intake           750.83 ml  Output              605 ml  Net           145.83 ml     Physical Exam  Gen: Poorly arousable, on Ventimask HEENT: moist mucosa, supple neck Chest: Diminished Bilateral breath sounds CVS: N S1&S2, no murmurs GI: soft, NT, ND, BS+ Musculoskeletal: warm, no edema     Data Review:    CBC  Recent Labs Lab 12/19/15 0339 12/20/15 0330 12/22/15 0334 12/23/15 0338  WBC 8.7 9.4 8.8 11.1*  HGB 12.0* 12.2* 12.4* 13.1  HCT 36.5* 38.9* 39.5 41.9  PLT 139* 158 153 120*  MCV 98.6 101.6* 101.8* 102.9*  MCH 32.4 31.9 32.0 32.2  MCHC 32.9 31.4 31.4 31.3  RDW 14.8 15.3 15.7* 16.0*  LYMPHSABS  --  1.0  --   --   MONOABS  --  0.8  --   --   EOSABS  --  0.1  --   --   BASOSABS  --  0.0  --   --     Chemistries   Recent Labs Lab 12/19/15 0339 12/20/15 0330 12/22/15 0334 12/24/15 0346  NA 136 141 143 146*  K 5.3* 5.1 5.3* 4.8  CL 98* 102 105 108  CO2 '31 28 29 31  '$ GLUCOSE 76 91 124* 132*  BUN 64* 60* 67* 85*  CREATININE 1.27* 1.23 1.48* 1.86*  CALCIUM 9.7 9.7 9.9 9.4  MG  --  2.2  --   --    ------------------------------------------------------------------------------------------------------------------ No results for input(s): CHOL, HDL, LDLCALC, TRIG, CHOLHDL, LDLDIRECT in the last 72 hours.  No results found for: HGBA1C ------------------------------------------------------------------------------------------------------------------ No results for input(s): TSH, T4TOTAL, T3FREE,  THYROIDAB in the last 72 hours.  Invalid input(s): FREET3 ------------------------------------------------------------------------------------------------------------------ No results for input(s): VITAMINB12, FOLATE, FERRITIN, TIBC, IRON, RETICCTPCT in the last 72 hours.  Coagulation profile No results for input(s): INR, PROTIME in the last 168 hours.  No results for input(s): DDIMER in the last 72 hours.  Cardiac Enzymes No results for input(s): CKMB, TROPONINI, MYOGLOBIN in the last 168 hours.  Invalid input(s): CK ------------------------------------------------------------------------------------------------------------------    Component Value Date/Time   BNP 3,356.2 (H) 12/22/2015 0334    Inpatient Medications  Scheduled Meds: . fluticasone  2 spray Each Nare Daily  . furosemide  40 mg Intravenous BID  . mouth rinse  15 mL Mouth Rinse BID  . polyvinyl alcohol  1 drop Left Eye Daily  . prednisoLONE acetate  1 drop Right Eye QHS  . sodium chloride  1,000 mL Intravenous Once   And  . sodium chloride  1,000 mL Intravenous Once   Continuous Infusions: . sodium chloride Stopped (2015-12-31 0900)  . sodium chloride 20 mL/hr at 2015-12-31 1123   PRN Meds:.acetaminophen **OR** acetaminophen, antiseptic oral rinse, glycopyrrolate **OR** glycopyrrolate **OR** glycopyrrolate, haloperidol **OR** haloperidol **OR** haloperidol lactate, HYDROmorphone (DILAUDID) injection, ondansetron **OR** ondansetron (ZOFRAN) IV, polyvinyl alcohol  Micro Results Recent Results (from the past 240 hour(s))  MRSA PCR Screening     Status: None   Collection Time: 12/18/15 10:29 AM  Result Value Ref Range Status   MRSA by PCR NEGATIVE NEGATIVE Final    Comment:        The GeneXpert MRSA Assay (FDA approved for NASAL specimens only), is one component of a comprehensive MRSA colonization surveillance program. It is not intended to diagnose MRSA infection nor to guide or monitor treatment for MRSA  infections.     Radiology Reports Dg Chest Port 1 View  Result Date: 12/22/2015 CLINICAL DATA:  Shortness of breath. EXAM: PORTABLE CHEST 1 VIEW COMPARISON:  12/18/2015. FINDINGS: AICD noted in stable position. Cardiomegaly with pulmonary vascular prominence and bilateral interstitial prominence consistent with congestive heart failure. Left lower lobe atelectasis and/or infiltrate. Small bilateral pleural effusions. IMPRESSION: 1.  AICD noted in good anatomic position. 2.  Congestive heart failure with pulmonary interstitial edema . 3.  Left lower lobe atelectasis and/or infiltrate. Electronically Signed   By: Marcello Moores  Register   On: 12/22/2015 09:07   Dg Chest Port 1 View  Result Date: 12/18/2015 CLINICAL DATA:  Hypoxia EXAM: PORTABLE CHEST 1 VIEW COMPARISON:  12/01/2015 FINDINGS: Left subclavian AICD device and leads are stable. Leads are intact. Moderate cardiomegaly. Left base opacified. Hazy airspace disease at the right base. Vascular congestion. No evidence of Kerley B line. IMPRESSION: Stable dense opacity at the left base. Hazy airspace disease at the right base. Findings are not significantly changed. Cardiomegaly and vascular congestion. Electronically Signed   By: Marybelle Killings M.D.   On: 12/18/2015 10:11   Dg Chest Port 1 View  Result Date: 12/10/2015 CLINICAL DATA:  Chronic shortness of breath. On home oxygen. History of hypertension and diabetes. EXAM: PORTABLE CHEST 1 VIEW COMPARISON:  12/23/2012. FINDINGS: 1633 hours. Mild patient rotation to the left. Left subclavian AICD leads appear grossly unchanged. Old leads are present. The heart is enlarged. There is aortic atherosclerosis. There is progressive volume loss and opacity inferiorly in the left hemithorax with a probable adjacent small left pleural effusion. There is mild overall vascular congestion without focal opacity in the right lung. The bones appear unchanged. IMPRESSION: 1. Progressive volume loss and basilar opacity in the  left hemithorax suspicious for atelectasis or infiltrate. Probable adjacent small left pleural effusion. Followup PA and lateral chest X-ray is recommended in 3-4 weeks (following trial of antibiotic therapy if clinically warranted) to ensure resolution and exclude underlying malignancy. 2. Cardiomegaly with chronic vascular congestion. Electronically Signed   By: Richardean Sale M.D.   On: 12/01/2015 16:57    Time Spent in minutes 25   Louellen Molder M.D on 12/31/15 at 11:50 AM  Between 7am to 7pm - Pager - [phone]  After 7pm go to [email or web address] - password [facility]  [facility] -  Office  [phone]

## 2016-01-19 NOTE — Discharge Summary (Addendum)
Physician Discharge Summary  Louis Acosta WVP:[id number] DOB: 1931/04/30 DOA: 11/27/2015  PCP: [name], MD  Admit date: 12/03/2015 Discharge date: Jan 01, 2016    Patient expired on 01/01/16 at 2:30 PM       Discharge Diagnoses:  Principal Problem:   Acute respiratory failure with hypoxia ([place])  Active Problems:   Sepsis due to pneumonia [facility])   Essential hypertension, benign   CARDIAC PACEMAKER-[place]   Lobar pneumonia, unspecified organism (HCC)   CKD (chronic kidney disease) stage 3, GFR 30-59 ml/min   Controlled diabetes mellitus with diabetic nephropathy, with long-term current use of insulin (HCC)   Dyslipidemia associated with type 2 diabetes mellitus (HCC)   Elevated troponin   Leukocytosis   Acute on chronic combined systolic and diastolic CHF (congestive heart failure) (HCC)   Pressure injury of skin   Oliguria   Aspiration into airway   Acute encephalopathy    BRIEF NARRATIVE/ HPI 82 year old male with hypertension, chronic combined CHF with decompensation, pacemaker status, diabetes mellitus on insulin, dyslipidemia, she kidney stage III presented from home with generalized weakness and increasing shortness of breath. In the ED he was hypotensive with blood pressure of 73/58 mmHg, tachycardic in the low 100s, tachypnea and febrile to 100.46F and hypoxic on room air at 72%, improved to 93% with 4 L nasal cannula. Blood work showed significant leukocytosis of 22.7, creatinine 1.86 and lactic acid of 2.7. Patient met criteria for sepsis. Chest x-ray showed volume loss and basilar opacity in left hemithorax suspicious of infiltrate. Patient requiring step down monitoring given ongoing hypoxic respiratory failure.  Hospital course Principal Problem:   Acute respiratory failure with hypoxia ([place]) Secondary to decompensated CHF and community-acquired/aspiration  pneumonia.  completed 8 days of antibiotic course.  Ongoing hypoxic respiratory failure  despite aggressive treatment.  Palliative care met with family and they wish to make himfull comfort care. patient expired within a few hours after being made comfort care.    Active Problems: Sepsis secondary to healthcare associated pneumonia Ongoing respiratory failure and fever despite completion of antibiotics. Suspect aspiration.    Acute on chronic combined systolic and diastolic CHF (congestive heart failure) ([place]) 2-D echo with EF of 15%.Given IV Lasix while in the hospital.   Acute metabolic and respiratory encephalopathy Multifactorial due to sepsis, hypercapnic respiratory failure, pneumonia.  Hypotension Likely due to ongoing sepsis and multiorgan failure.   CKD (chronic kidney disease) stage 3, GFR 30-59 ml/min Renal function at baseline.  Elevated troponin Secondary to demand ischemia. Low EF with diffuse hypokinesis on 2-D echo.   Anemia of chronic kidney disease  Controlled diabetes mellitus with diabetic nephropathy, with long-term current use of insulin (HCC)  Diabetic neuropathy  Hypothyroidism  Dyslipidemia associated with type 2 diabetes mellitus ([place])       Family Communication  :  family at bedside    Consults  :   PC CM Palliative care  Procedures  : 2-D echo      Allergies  Allergen Reactions  . Clindamycin/Lincomycin Nausea And Vomiting        Procedures/Studies: Dg Chest Port 1 View  Result Date: 12/22/2015 CLINICAL DATA:  Shortness of breath. EXAM: PORTABLE CHEST 1 VIEW COMPARISON:  12/18/2015. FINDINGS: AICD noted in stable position. Cardiomegaly with pulmonary vascular prominence and bilateral interstitial prominence consistent with congestive heart failure. Left lower lobe atelectasis and/or infiltrate. Small bilateral pleural effusions. IMPRESSION: 1.  AICD noted in good anatomic position. 2.  Congestive heart failure with pulmonary interstitial edema . 3.  Left lower lobe atelectasis  and/or infiltrate. Electronically Signed   By: Marcello Moores  Register   On: 12/22/2015 09:07   Dg Chest Port 1 View  Result Date: 12/18/2015 CLINICAL DATA:  Hypoxia EXAM: PORTABLE CHEST 1 VIEW COMPARISON:  11/28/2015 FINDINGS: Left subclavian AICD device and leads are stable. Leads are intact. Moderate cardiomegaly. Left base opacified. Hazy airspace disease at the right base. Vascular congestion. No evidence of Kerley B line. IMPRESSION: Stable dense opacity at the left base. Hazy airspace disease at the right base. Findings are not significantly changed. Cardiomegaly and vascular congestion. Electronically Signed   By: Marybelle Killings M.D.   On: 12/18/2015 10:11   Dg Chest Port 1 View  Result Date: 11/19/2015 CLINICAL DATA:  Chronic shortness of breath. On home oxygen. History of hypertension and diabetes. EXAM: PORTABLE CHEST 1 VIEW COMPARISON:  12/23/2012. FINDINGS: 1633 hours. Mild patient rotation to the left. Left subclavian AICD leads appear grossly unchanged. Old leads are present. The heart is enlarged. There is aortic atherosclerosis. There is progressive volume loss and opacity inferiorly in the left hemithorax with a probable adjacent small left pleural effusion. There is mild overall vascular congestion without focal opacity in the right lung. The bones appear unchanged. IMPRESSION: 1. Progressive volume loss and basilar opacity in the left hemithorax suspicious for atelectasis or infiltrate. Probable adjacent small left pleural effusion. Followup PA and lateral chest X-ray is recommended in 3-4 weeks (following trial of antibiotic therapy if clinically warranted) to ensure resolution and exclude underlying malignancy. 2. Cardiomegaly with chronic vascular congestion. Electronically Signed   By: Richardean Sale M.D.   On: 11/22/2015 16:57       Discharge Exam: Vitals:   2015/12/26 1200 26-Dec-2015 1300  BP: (!) 85/45 (!) 116/40  Pulse:    Resp: (!) 34 (!) 44  Temp:     Vitals:   2015-12-26 1100  Dec 26, 2015 1137 12-26-15 1200 26-Dec-2015 1300  BP: (!) 91/47  (!) 85/45 (!) 116/40  Pulse:      Resp: (!) 26  (!) 34 (!) 44  Temp:      TempSrc:      SpO2: 100% 93% (!) 71% (!) 65%  Weight:      Height:           The results of significant diagnostics from this hospitalization (including imaging, microbiology, ancillary and laboratory) are listed below for reference.     Microbiology: Recent Results (from the past 240 hour(s))  MRSA PCR Screening     Status: None   Collection Time: 12/18/15 10:29 AM  Result Value Ref Range Status   MRSA by PCR NEGATIVE NEGATIVE Final    Comment:        The GeneXpert MRSA Assay (FDA approved for NASAL specimens only), is one component of a comprehensive MRSA colonization surveillance program. It is not intended to diagnose MRSA infection nor to guide or monitor treatment for MRSA infections.      Labs: BNP (last 3 results)  Recent Labs  11/20/2015 1618 12/22/15 0334  BNP 2,469.3* 5,462.7*   Basic Metabolic Panel:  Recent Labs Lab 12/19/15 0339 12/20/15 0330 12/22/15 0334 12/24/15 0346  NA 136 141 143 146*  K 5.3* 5.1 5.3* 4.8  CL 98* 102 105 108  CO2 '31 28 29 31  '$ GLUCOSE 76 91 124* 132*  BUN 64* 60* 67* 85*  CREATININE 1.27* 1.23 1.48* 1.86*  CALCIUM 9.7 9.7 9.9 9.4  MG  --  2.2  --   --  PHOS  --  3.5  --   --    Liver Function Tests:  Recent Labs Lab 12/20/15 0330  ALBUMIN 3.4*   No results for input(s): LIPASE, AMYLASE in the last 168 hours. No results for input(s): AMMONIA in the last 168 hours. CBC:  Recent Labs Lab 12/19/15 0339 12/20/15 0330 12/22/15 0334 12/23/15 0338  WBC 8.7 9.4 8.8 11.1*  NEUTROABS  --  7.6  --   --   HGB 12.0* 12.2* 12.4* 13.1  HCT 36.5* 38.9* 39.5 41.9  MCV 98.6 101.6* 101.8* 102.9*  PLT 139* 158 153 120*   Cardiac Enzymes: No results for input(s): CKTOTAL, CKMB, CKMBINDEX, TROPONINI in the last 168 hours. BNP: Invalid input(s): POCBNP CBG:  Recent Labs Lab  12/24/15 1614 12/24/15 2051 2016/01/23 0023 01/23/2016 0350 01-23-16 0741  GLUCAP 120* 137* 144* 149* 128*   D-Dimer No results for input(s): DDIMER in the last 72 hours. Hgb A1c No results for input(s): HGBA1C in the last 72 hours. Lipid Profile No results for input(s): CHOL, HDL, LDLCALC, TRIG, CHOLHDL, LDLDIRECT in the last 72 hours. Thyroid function studies No results for input(s): TSH, T4TOTAL, T3FREE, THYROIDAB in the last 72 hours.  Invalid input(s): FREET3 Anemia work up No results for input(s): VITAMINB12, FOLATE, FERRITIN, TIBC, IRON, RETICCTPCT in the last 72 hours. Urinalysis    Component Value Date/Time   COLORURINE YELLOW 10/23/2012 [place] 10/23/2012 0955   LABSPEC 1.013 10/23/2012 0955   PHURINE 7.0 10/23/2012 0955   GLUCOSEU NEGATIVE 10/23/2012 0955   HGBUR NEGATIVE 10/23/2012 0955   BILIRUBINUR NEGATIVE 10/23/2012 0955   KETONESUR NEGATIVE 10/23/2012 0955   PROTEINUR NEGATIVE 10/23/2012 0955   UROBILINOGEN 1.0 10/23/2012 0955   NITRITE NEGATIVE 10/23/2012 0955   LEUKOCYTESUR NEGATIVE 10/23/2012 0955   Sepsis Labs Invalid input(s): PROCALCITONIN,  WBC,  LACTICIDVEN Microbiology Recent Results (from the past 240 hour(s))  MRSA PCR Screening     Status: None   Collection Time: 12/18/15 10:29 AM  Result Value Ref Range Status   MRSA by PCR NEGATIVE NEGATIVE Final    Comment:        The GeneXpert MRSA Assay (FDA approved for NASAL specimens only), is one component of a comprehensive MRSA colonization surveillance program. It is not intended to diagnose MRSA infection nor to guide or monitor treatment for MRSA infections.      Time coordinating discharge: Over 30 minutes  SIGNED:   Louellen Molder, MD  [facility] 01-23-16, 3:46 PM Pager   If 7PM-7AM, please contact night-coverage [email or web address] Password [email or web address]

## 2016-01-19 DEATH — deceased
